# Patient Record
Sex: Female | Born: 1960 | Race: Black or African American | Hispanic: No | Marital: Single | Smoking: Former smoker
Health system: Southern US, Community
[De-identification: ages and names within clinical notes are randomized; demographics above are authoritative.]

## PROBLEM LIST (undated history)

## (undated) DIAGNOSIS — D509 Iron deficiency anemia, unspecified: Secondary | ICD-10-CM

## (undated) DIAGNOSIS — J310 Chronic rhinitis: Secondary | ICD-10-CM

## (undated) DIAGNOSIS — G8929 Other chronic pain: Secondary | ICD-10-CM

## (undated) DIAGNOSIS — I4891 Unspecified atrial fibrillation: Secondary | ICD-10-CM

## (undated) DIAGNOSIS — M722 Plantar fascial fibromatosis: Secondary | ICD-10-CM

## (undated) DIAGNOSIS — G4733 Obstructive sleep apnea (adult) (pediatric): Secondary | ICD-10-CM

## (undated) DIAGNOSIS — I8390 Asymptomatic varicose veins of unspecified lower extremity: Secondary | ICD-10-CM

## (undated) DIAGNOSIS — I10 Essential (primary) hypertension: Secondary | ICD-10-CM

## (undated) DIAGNOSIS — K219 Gastro-esophageal reflux disease without esophagitis: Secondary | ICD-10-CM

## (undated) DIAGNOSIS — B20 Human immunodeficiency virus [HIV] disease: Secondary | ICD-10-CM

## (undated) DIAGNOSIS — G2581 Restless legs syndrome: Secondary | ICD-10-CM

## (undated) HISTORY — DX: Unspecified atrial fibrillation: I48.91

## (undated) HISTORY — DX: Gastro-esophageal reflux disease without esophagitis: K21.9

## (undated) HISTORY — DX: Restless legs syndrome: G25.81

## (undated) HISTORY — DX: Chronic rhinitis: J31.0

## (undated) HISTORY — DX: Other chronic pain: G89.29

## (undated) HISTORY — PX: FOOT SURGERY: SHX648

## (undated) HISTORY — DX: Obstructive sleep apnea (adult) (pediatric): G47.33

## (undated) HISTORY — DX: Iron deficiency anemia, unspecified: D50.9

## (undated) HISTORY — PX: TUBAL LIGATION: SHX77

## (undated) HISTORY — PX: TONSILLECTOMY: SUR1361

## (undated) HISTORY — DX: Human immunodeficiency virus (HIV) disease: B20

---

## 2000-12-10 ENCOUNTER — Emergency Department (HOSPITAL_COMMUNITY): Admission: EM | Admit: 2000-12-10 | Discharge: 2000-12-10 | Payer: Self-pay | Admitting: Emergency Medicine

## 2001-09-27 ENCOUNTER — Emergency Department (HOSPITAL_COMMUNITY): Admission: EM | Admit: 2001-09-27 | Discharge: 2001-09-27 | Payer: Self-pay | Admitting: Emergency Medicine

## 2001-09-27 ENCOUNTER — Encounter: Payer: Self-pay | Admitting: Emergency Medicine

## 2004-10-06 ENCOUNTER — Emergency Department (HOSPITAL_COMMUNITY): Admission: EM | Admit: 2004-10-06 | Discharge: 2004-10-06 | Payer: Self-pay | Admitting: Emergency Medicine

## 2006-08-28 ENCOUNTER — Emergency Department (HOSPITAL_COMMUNITY): Admission: EM | Admit: 2006-08-28 | Discharge: 2006-08-28 | Payer: Self-pay | Admitting: Family Medicine

## 2008-06-03 ENCOUNTER — Emergency Department (HOSPITAL_COMMUNITY): Admission: EM | Admit: 2008-06-03 | Discharge: 2008-06-03 | Payer: Self-pay | Admitting: Emergency Medicine

## 2008-09-04 ENCOUNTER — Emergency Department (HOSPITAL_COMMUNITY): Admission: EM | Admit: 2008-09-04 | Discharge: 2008-09-04 | Payer: Self-pay | Admitting: Family Medicine

## 2010-07-17 ENCOUNTER — Observation Stay (HOSPITAL_COMMUNITY)
Admission: EM | Admit: 2010-07-17 | Discharge: 2010-07-19 | Payer: Self-pay | Source: Home / Self Care | Attending: Internal Medicine | Admitting: Internal Medicine

## 2010-07-17 LAB — BASIC METABOLIC PANEL
BUN: 6 mg/dL (ref 6–23)
CO2: 26 mEq/L (ref 19–32)
Calcium: 8.9 mg/dL (ref 8.4–10.5)
Chloride: 105 mEq/L (ref 96–112)
Creatinine, Ser: 0.71 mg/dL (ref 0.4–1.2)
GFR calc Af Amer: >60 mL/min (ref >60)
GFR calc non Af Amer: >60 mL/min (ref >60)
Glucose, Bld: 92 mg/dL (ref 70–99)
Potassium: 4.1 mEq/L (ref 3.5–5.1)
Sodium: 139 mEq/L (ref 135–145)

## 2010-07-17 LAB — TSH: TSH: 2.598 u[IU]/mL (ref 0.350–4.500)

## 2010-07-17 LAB — PHOSPHORUS: Phosphorus: 3.5 mg/dL (ref 2.3–4.6)

## 2010-07-17 LAB — CK TOTAL AND CKMB (NOT AT ARMC)
CK, MB: 1.3 ng/mL (ref 0.3–4.0)
Relative Index: 1.3 (ref 0.0–2.5)
Total CK: 104 U/L (ref 7–177)

## 2010-07-17 LAB — CARDIAC PANEL(CRET KIN+CKTOT+MB+TROPI)
CK, MB: 1.5 ng/mL (ref 0.3–4.0)
CK, MB: 1.3 ng/mL (ref 0.3–4.0)
Relative Index: 1.3 (ref 0.0–2.5)
Relative Index: 1.2 (ref 0.0–2.5)
Total CK: 113 U/L (ref 7–177)
Total CK: 111 U/L (ref 7–177)
Troponin I: 0.03 ng/mL (ref 0.00–0.06)
Troponin I: 0.01 ng/mL (ref 0.00–0.06)

## 2010-07-17 LAB — DIFFERENTIAL
Basophils Absolute: 0 10*3/uL (ref 0.0–0.1)
Basophils Relative: 0 % (ref 0–1)
Eosinophils Absolute: 0.1 10*3/uL (ref 0.0–0.7)
Eosinophils Relative: 1 % (ref 0–5)
Lymphocytes Relative: 21 % (ref 12–46)
Lymphs Abs: 1.5 10*3/uL (ref 0.7–4.0)
Monocytes Absolute: 0.4 10*3/uL (ref 0.1–1.0)
Monocytes Relative: 6 % (ref 3–12)
Neutro Abs: 5.2 10*3/uL (ref 1.7–7.7)
Neutrophils Relative %: 72 % (ref 43–77)

## 2010-07-17 LAB — CBC
HCT: 22.8 % — ABNORMAL LOW (ref 36.0–46.0)
HCT: 24.8 % — ABNORMAL LOW (ref 36.0–46.0)
Hemoglobin: 6.4 g/dL — CL (ref 12.0–15.0)
Hemoglobin: 7.6 g/dL — ABNORMAL LOW (ref 12.0–15.0)
MCH: 17.4 pg — ABNORMAL LOW (ref 26.0–34.0)
MCH: 19.8 pg — ABNORMAL LOW (ref 26.0–34.0)
MCHC: 28.1 g/dL — ABNORMAL LOW (ref 30.0–36.0)
MCHC: 30.6 g/dL (ref 30.0–36.0)
MCV: 62.1 fL — ABNORMAL LOW (ref 78.0–100.0)
MCV: 64.6 fL — ABNORMAL LOW (ref 78.0–100.0)
Platelets: 218 10*3/uL (ref 150–400)
Platelets: 156 10*3/uL (ref 150–400)
RBC: 3.67 MIL/uL — ABNORMAL LOW (ref 3.87–5.11)
RBC: 3.84 MIL/uL — ABNORMAL LOW (ref 3.87–5.11)
RDW: 21.9 % — ABNORMAL HIGH (ref 11.5–15.5)
RDW: 24.3 % — ABNORMAL HIGH (ref 11.5–15.5)
WBC: 7.2 10*3/uL (ref 4.0–10.5)
WBC: 4.9 10*3/uL (ref 4.0–10.5)

## 2010-07-17 LAB — URINALYSIS, ROUTINE W REFLEX MICROSCOPIC
Bilirubin Urine: NEGATIVE
Hemoglobin, Urine: NEGATIVE
Ketones, ur: 15 mg/dL — AB
Nitrite: NEGATIVE
Protein, ur: NEGATIVE mg/dL
Specific Gravity, Urine: 1.01 (ref 1.005–1.030)
Urine Glucose, Fasting: NEGATIVE mg/dL
Urobilinogen, UA: 0.2 mg/dL (ref 0.0–1.0)
pH: 5.5 (ref 5.0–8.0)

## 2010-07-17 LAB — LIPID PANEL
Cholesterol: 91 mg/dL (ref 0–200)
HDL: 50 mg/dL (ref >39)
LDL Cholesterol: 34 mg/dL (ref 0–99)
Total CHOL/HDL Ratio: 1.8 RATIO
Triglycerides: 35 mg/dL (ref <150)
VLDL: 7 mg/dL (ref 0–40)

## 2010-07-17 LAB — BRAIN NATRIURETIC PEPTIDE: Pro B Natriuretic peptide (BNP): 92 pg/mL (ref 0.0–100.0)

## 2010-07-17 LAB — PROTIME-INR
INR: 1.17 (ref 0.00–1.49)
Prothrombin Time: 15.1 seconds (ref 11.6–15.2)

## 2010-07-17 LAB — FOLATE: Folate: 14.3 ng/mL

## 2010-07-17 LAB — RETICULOCYTES
RBC.: 3.77 MIL/uL — ABNORMAL LOW (ref 3.87–5.11)
Retic Count, Absolute: 30.2 10*3/uL (ref 19.0–186.0)
Retic Ct Pct: 0.8 % (ref 0.4–3.1)

## 2010-07-17 LAB — D-DIMER, QUANTITATIVE: D-Dimer, Quant: 0.34 ug/mL-FEU (ref 0.00–0.48)

## 2010-07-17 LAB — RAPID URINE DRUG SCREEN, HOSP PERFORMED
Amphetamines: NOT DETECTED
Barbiturates: NOT DETECTED
Benzodiazepines: NOT DETECTED
Cocaine: NOT DETECTED
Opiates: POSITIVE — AB
Tetrahydrocannabinol: POSITIVE — AB

## 2010-07-17 LAB — APTT: aPTT: 30 seconds (ref 24–37)

## 2010-07-17 LAB — MAGNESIUM: Magnesium: 2 mg/dL (ref 1.5–2.5)

## 2010-07-17 LAB — PREPARE RBC (CROSSMATCH)

## 2010-07-17 LAB — ABO/RH: ABO/RH(D): O POS

## 2010-07-17 LAB — TROPONIN I: Troponin I: 0.03 ng/mL (ref 0.00–0.06)

## 2010-07-17 LAB — VITAMIN B12: Vitamin B-12: 466 pg/mL (ref 211–911)

## 2010-07-18 ENCOUNTER — Encounter: Payer: Self-pay | Admitting: Gastroenterology

## 2010-07-18 ENCOUNTER — Encounter (INDEPENDENT_AMBULATORY_CARE_PROVIDER_SITE_OTHER): Payer: Self-pay | Admitting: Internal Medicine

## 2010-07-18 LAB — BASIC METABOLIC PANEL
BUN: 6 mg/dL (ref 6–23)
CO2: 24 mEq/L (ref 19–32)
Calcium: 8.5 mg/dL (ref 8.4–10.5)
Chloride: 103 mEq/L (ref 96–112)
Creatinine, Ser: 0.66 mg/dL (ref 0.4–1.2)
GFR calc Af Amer: >60 mL/min (ref >60)
GFR calc non Af Amer: >60 mL/min (ref >60)
Glucose, Bld: 72 mg/dL (ref 70–99)
Potassium: 3.7 mEq/L (ref 3.5–5.1)
Sodium: 137 mEq/L (ref 135–145)

## 2010-07-18 LAB — MRSA PCR SCREENING: MRSA by PCR: NEGATIVE

## 2010-07-18 LAB — HEMOGLOBIN AND HEMATOCRIT, BLOOD
HCT: 26.9 % — ABNORMAL LOW (ref 36.0–46.0)
Hemoglobin: 8.1 g/dL — ABNORMAL LOW (ref 12.0–15.0)

## 2010-07-18 LAB — BRAIN NATRIURETIC PEPTIDE: Pro B Natriuretic peptide (BNP): 74 pg/mL (ref 0.0–100.0)

## 2010-07-18 LAB — MAGNESIUM: Magnesium: 2.2 mg/dL (ref 1.5–2.5)

## 2010-07-18 LAB — TSH: TSH: 6.379 u[IU]/mL — ABNORMAL HIGH (ref 0.350–4.500)

## 2010-07-21 ENCOUNTER — Encounter: Payer: Self-pay | Admitting: Gastroenterology

## 2010-07-28 LAB — CBC
HCT: 28.7 % — ABNORMAL LOW (ref 36.0–46.0)
Hemoglobin: 8.7 g/dL — ABNORMAL LOW (ref 12.0–15.0)
MCH: 20 pg — ABNORMAL LOW (ref 26.0–34.0)
MCHC: 30.3 g/dL (ref 30.0–36.0)
MCV: 66 fL — ABNORMAL LOW (ref 78.0–100.0)
Platelets: 126 10*3/uL — ABNORMAL LOW (ref 150–400)
RBC: 4.35 MIL/uL (ref 3.87–5.11)
RDW: 25.7 % — ABNORMAL HIGH (ref 11.5–15.5)
WBC: 4.7 10*3/uL (ref 4.0–10.5)

## 2010-07-28 LAB — TYPE AND SCREEN
ABO/RH(D): O POS
Antibody Screen: NEGATIVE
Unit division: 0
Unit division: 0
Unit division: 0

## 2010-07-30 ENCOUNTER — Emergency Department (HOSPITAL_COMMUNITY)
Admission: EM | Admit: 2010-07-30 | Discharge: 2010-07-30 | Payer: Self-pay | Source: Home / Self Care | Admitting: Emergency Medicine

## 2010-08-04 LAB — DIFFERENTIAL
Basophils Absolute: 0 10*3/uL (ref 0.0–0.1)
Basophils Relative: 0 % (ref 0–1)
Eosinophils Absolute: 0.2 10*3/uL (ref 0.0–0.7)
Eosinophils Relative: 3 % (ref 0–5)
Lymphocytes Relative: 35 % (ref 12–46)
Lymphs Abs: 1.9 10*3/uL (ref 0.7–4.0)
Monocytes Absolute: 0.4 10*3/uL (ref 0.1–1.0)
Monocytes Relative: 8 % (ref 3–12)
Neutro Abs: 2.8 10*3/uL (ref 1.7–7.7)
Neutrophils Relative %: 54 % (ref 43–77)

## 2010-08-04 LAB — POCT CARDIAC MARKERS
CKMB, poc: <1 ng/mL — ABNORMAL LOW (ref 1.0–8.0)
CKMB, poc: <1 ng/mL — ABNORMAL LOW (ref 1.0–8.0)
Myoglobin, poc: 30.7 ng/mL (ref 12–200)
Myoglobin, poc: 40.5 ng/mL (ref 12–200)
Troponin i, poc: <0.05 ng/mL (ref 0.00–0.09)
Troponin i, poc: <0.05 ng/mL (ref 0.00–0.09)

## 2010-08-04 LAB — POCT I-STAT, CHEM 8
BUN: 10 mg/dL (ref 6–23)
Calcium, Ion: 1.12 mmol/L (ref 1.12–1.32)
Chloride: 105 mEq/L (ref 96–112)
Creatinine, Ser: 0.7 mg/dL (ref 0.4–1.2)
Glucose, Bld: 88 mg/dL (ref 70–99)
HCT: 34 % — ABNORMAL LOW (ref 36.0–46.0)
Hemoglobin: 11.6 g/dL — ABNORMAL LOW (ref 12.0–15.0)
Potassium: 3.7 mEq/L (ref 3.5–5.1)
Sodium: 140 mEq/L (ref 135–145)
TCO2: 25 mmol/L (ref 0–100)

## 2010-08-04 LAB — CBC
HCT: 32 % — ABNORMAL LOW (ref 36.0–46.0)
Hemoglobin: 9.4 g/dL — ABNORMAL LOW (ref 12.0–15.0)
MCH: 20.4 pg — ABNORMAL LOW (ref 26.0–34.0)
MCHC: 29.4 g/dL — ABNORMAL LOW (ref 30.0–36.0)
MCV: 69.4 fL — ABNORMAL LOW (ref 78.0–100.0)
Platelets: 308 10*3/uL (ref 150–400)
RBC: 4.61 MIL/uL (ref 3.87–5.11)
RDW: 30.4 % — ABNORMAL HIGH (ref 11.5–15.5)
WBC: 5.3 10*3/uL (ref 4.0–10.5)

## 2010-08-04 LAB — D-DIMER, QUANTITATIVE: D-Dimer, Quant: 1.06 ug/mL-FEU — ABNORMAL HIGH (ref 0.00–0.48)

## 2010-08-04 LAB — POCT PREGNANCY, URINE: Preg Test, Ur: NEGATIVE

## 2010-08-14 NOTE — Procedures (Signed)
Summary: Upper Endoscopy  Patient: Claudia Brady Note: All result statuses are Final unless otherwise noted.  Tests: (1) Upper Endoscopy (EGD)   EGD Upper Endoscopy       DONE     [facility]     [address]     [place], [place]  [address]           ENDOSCOPY PROCEDURE REPORT     PATIENT:  Claudia Brady, Claudia Brady  MR#:  [id number]     BIRTHDATE:  [birth year], 49 yrs. old  GENDER:  female     ENDOSCOPIST:  Judie Petit T. Russella Dar, MD, [facility]     Referred by:  [facility]     PROCEDURE DATE:  07/18/2010     PROCEDURE:  EGD with biopsy, [address]     ASA CLASS:  Class II     INDICATIONS:  iron deficiency anemia, early satiety     MEDICATIONS:  Fentanyl 50 mcg IV, Versed 4 mg IV     TOPICAL ANESTHETIC:  Cetacaine Spray     DESCRIPTION OF PROCEDURE:   After the risks benefits and     alternatives of the procedure were thoroughly explained, informed     consent was obtained.  The Pentax Gastroscope [id number] endoscope     was introduced through the mouth and advanced to the second     portion of the duodenum, without limitations. A techincal problem     led to black and white photos. The instrument was slowly withdrawn     as the mucosa was fully examined.     <<PROCEDUREIMAGES>>     The esophagus and gastroesophageal junction were completely normal     in appearance. An ulcer was found in the antrum. It was benign     appearing and clean based. It was 5 mm in size. Multiple biopsies     were obtained and sent to pathology. Abnormal appearing mucosa in     the fundus. Thickened erythematous folds vs polyps. Multiple     biopsies were obtained and sent to pathology.  Otherwise normal     stomach.  The duodenal bulb was normal in appearance, as was the     postbulbar duodenum.   Retroflexed views revealed no     abnormalities.  The scope was then withdrawn from the patient and     the procedure completed.           COMPLICATIONS:  None           ENDOSCOPIC IMPRESSION:         1) 5 mm ulcer in the antrum     2) Abnormal mucosa in the fundus           RECOMMENDATIONS:     1) avoid ASA/NSAIDs     2) PPI qam     3) Await pathology results     4) Elective outpatient colonoscopy           Evertt Chouinard T. Russella Dar, MD, [facility]           n.     eSIGNED:   Venita Lick. Aamina Skiff at 07/18/2010 04:24 PM           Galvin Proffer, [id number]  Note: An exclamation mark (!) indicates a result that was not dispersed into the flowsheet. Document Creation Date: 07/18/2010 4:30 PM _______________________________________________________________________  (1) Order result status: Final Collection or observation date-time: 07/18/2010 15:29 Requested date-time:  Receipt date-time:  Reported date-time:  Referring Physician:  Ordering Physician: Claudette Head [phone]) Specimen Source:  Source: Launa Grill Order Number: [id number] Lab site:

## 2010-08-14 NOTE — Letter (Signed)
Summary: Patient Notice-Endo Biopsy Results  [facility]  [address] [place], [place] [address]   Phone: [phone]  Fax: [phone]        July 21, 2010 MRN: [id number]    Claudia Brady [address] [place], [place]  [address]    Dear [name],  I am pleased to inform you that the biopsies taken during your recent endoscopic examination did not show any evidence of cancer upon pathologic examination. The biopsies showed a reactive gastropathy.  Continue with the treatment plan as outlined on the day of your      exam.  Please call us if you are having persistent problems or have questions about your condition that have not been fully answered at this time.  Sincerely,  Meryl Dare MD [facility]  This letter has been electronically signed by your physician.  Appended Document: Patient Notice-Endo Biopsy Results letter mailed to patient's home

## 2010-08-20 ENCOUNTER — Encounter: Payer: Self-pay | Admitting: Pulmonary Disease

## 2010-08-20 ENCOUNTER — Institutional Professional Consult (permissible substitution) (INDEPENDENT_AMBULATORY_CARE_PROVIDER_SITE_OTHER): Payer: Self-pay | Admitting: Pulmonary Disease

## 2010-08-20 DIAGNOSIS — G4733 Obstructive sleep apnea (adult) (pediatric): Secondary | ICD-10-CM | POA: Insufficient documentation

## 2010-08-20 DIAGNOSIS — I4891 Unspecified atrial fibrillation: Secondary | ICD-10-CM | POA: Insufficient documentation

## 2010-08-20 DIAGNOSIS — G2581 Restless legs syndrome: Secondary | ICD-10-CM | POA: Insufficient documentation

## 2010-08-20 DIAGNOSIS — E079 Disorder of thyroid, unspecified: Secondary | ICD-10-CM

## 2010-08-20 DIAGNOSIS — J45909 Unspecified asthma, uncomplicated: Secondary | ICD-10-CM | POA: Insufficient documentation

## 2010-08-20 DIAGNOSIS — J309 Allergic rhinitis, unspecified: Secondary | ICD-10-CM | POA: Insufficient documentation

## 2010-08-20 DIAGNOSIS — K219 Gastro-esophageal reflux disease without esophagitis: Secondary | ICD-10-CM | POA: Insufficient documentation

## 2010-08-20 DIAGNOSIS — D509 Iron deficiency anemia, unspecified: Secondary | ICD-10-CM | POA: Insufficient documentation

## 2010-08-20 HISTORY — DX: Restless legs syndrome: G25.81

## 2010-08-20 HISTORY — DX: Unspecified atrial fibrillation: I48.91

## 2010-08-28 NOTE — Assessment & Plan Note (Signed)
Summary: sleep consult//self referral//SH   Primary Provider/Referring Provider:  Dr. Audria Nine at [facility]  CC:  Sleep evaluation.Marland KitchenMarland KitchenEpworth score is 15.Marland Kitchen  History of Present Illness: 50 yo female for evaluation of sleep apnea.  She was hospitalized recently for GI bleeding and syncope.  During this evaluation concern was raised that she could have sleep apnea.  As a result sleep consultation was requested.  She goes to bed at 9pm, and falls asleep quickly.  She wakes up several times per night, and gets out of bed at 6am.  She feels tired in the morning, and will get headaches in the afternoon.  She does not take naps.  She does not use anything to help sleep, but can drink up to a pot of coffee during the day.  She does snore and wakes up with a choke.  She has been told by family members that she stops breathing while asleep.  She has gained about 20 lbs.  She denies sleep walking, sleep talking, nightmares, or bruxism.  There is no history of sleep hallucinations, sleep paralysis, or cataplexy.  She does not drink alcohol on a regular basis, and recently stopped smoking cigarettes.  She has been concerned about whether she could have thyroid disease.  Her mother has thyroid disease.  TSH from Jan 5 was 2.59, but was 6.37 from Jan 6.  She has noticed funny feelings in her legs before going to sleep, and this sometimes causes her to wake up from sleep.  Epworth score is 15 out of 24.  Echocardiogram  Procedure date:  07/18/2010  Findings:       Study Conclusions    - Left ventricle: The cavity size was normal. Wall thickness was     normal. Systolic function was normal. The estimated ejection     fraction was in the range of 55% to 60%. Wall motion was normal;     there were no regional wall motion abnormalities. There was a     reduced contribution of atrial contraction to ventricular filling,     due to increased ventricular diastolic pressure or atrial     contractile  dysfunction.   - Mitral valve: Mild regurgitation.   - Left atrium: The atrium was mildly dilated.   - Tricuspid valve: Mild regurgitation.   - Inferior vena cava: The vessel was dilated; the respirophasic     diameter changes were blunted (< 50%); findings are consistent     with elevated central venous pressure.   Preventive Screening-Counseling & Management  Alcohol-Tobacco     Alcohol drinks/day: 0     Smoking Status: quit     Packs/Day: 0.5     Year Started: 1982     Year Quit: 07/17/2010     Pack years: 15      Drug Use:  former and Marijuana use---stopped 07/17/2010 per patient.    Current Medications (verified): 1)  Ferrex 150 150 Mg Caps (Polysaccharide Iron Complex) .Marland Kitchen.. 1 By Mouth Daily 2)  Omeprazole 20 Mg Cpdr (Omeprazole) .Marland Kitchen.. 1 By Mouth Daily  Allergies (verified): 1)  ! Pcn  Past History:  Past Medical History: GERD Duodenal ulcer with Upper GI bleeding 2nd to NSAID use, Jan 2012 Iron deficiency anemia Chronic pain Rhinitis  Past Surgical History: Tubal Ligation on 01/25/1985 Left foot surgery in 2002 for heel spur Tonsillectomy  Family History: Sleep apnea Diabetes Hypertension Asthma Allergies Emphysema  Social History: Divorce.  Lives with her boyfriend.  Works with [employer].  Quit  smoking Jul 17, 2010.  Denies significant alcohol use, but has occasional THC use.Alcohol drinks/day:  0 Smoking Status:  quit Packs/Day:  0.5 Pack years:  15 Drug Use:  former, Marijuana use---stopped 07/17/2010 per patient  Review of Systems       The patient complains of shortness of breath with activity, shortness of breath at rest, non-productive cough, chest pain, irregular heartbeats, acid heartburn, indigestion, loss of appetite, weight change, abdominal pain, sore throat, tooth/dental problems, headaches, nasal congestion/difficulty breathing through nose, sneezing, itching, ear ache, anxiety, depression, hand/feet swelling, and joint stiffness  or pain.  The patient denies productive cough, coughing up blood, difficulty swallowing, rash, change in color of mucus, and fever.    Vital Signs:  Patient profile:   50 year old female Height:      66 inches (167.64 cm) Weight:      233 pounds (105.91 kg) BMI:     37.74 O2 Sat:      100 % on Room air Temp:     98.2 degrees F (36.78 degrees C) oral Pulse rate:   62 / minute BP sitting:   120 / 76  (left arm) Cuff size:   large  Vitals Entered By: Michel Bickers CMA (August 20, 2010 2:17 PM)  O2 Sat at Rest %:  100 O2 Flow:  Room air CC: Sleep evaluation.Marland KitchenMarland KitchenEpworth score is 15. Is Patient Diabetic? No Comments Medications reviewed with patient Phone number verified with the patient. Michel Bickers [facility]  August 20, 2010 2:17 PM   Physical Exam  General:  normal appearance, healthy appearing, and obese.   Eyes:  PERRLA and EOMI.   Nose:  no deformity, discharge, inflammation, or lesionsclear nasal discharge.   Mouth:  MP 4, elongated uvula Neck:  no JVD.   Chest Wall:  no deformities noted Lungs:  clear bilaterally to auscultation and percussion Heart:  regular rate and rhythm, S1, S2 without murmurs, rubs, gallops, or clicks Abdomen:  bowel sounds positive; abdomen soft and non-tender without masses, or organomegaly Extremities:  no clubbing, cyanosis, edema, or deformity noted Neurologic:  normal CN II-XII and strength normal.   Cervical Nodes:  no significant adenopathy Psych:  alert and cooperative; normal mood and affect; normal attention span and concentration   Impression & Recommendations:  Problem # 1:  OBSTRUCTIVE SLEEP APNEA (ICD-327.23) She has symptoms suggestive of sleep apnea.  She has daytime sleepiness, and sleep disruption.  To further assess will schedule a sleep test.  I have explained how sleep apnea can affect her health.  Driving precautions, and need for weight loss were reviewed.    Problem # 2:  RESTLESS LEG SYNDROME (ICD-333.94) She has symptoms  suggestive of restless legs.  This can be associated with sleep disruption from OSA, iron deficiency anemia, and thyroid disease.  Will wait until these are improved, and then re-assess whether she needs specific therapy for her legs.  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  Advised her to f/u with primary care for further assessment of this.  Medications Added to Medication List This Visit: 1)  Ferrex 150 150 Mg Caps (Polysaccharide iron complex) .Marland Kitchen.. 1 by mouth daily 2)  Omeprazole 20 Mg Cpdr (Omeprazole) .Marland Kitchen.. 1 by mouth daily  Complete Medication List: 1)  Ferrex 150 150 Mg Caps (Polysaccharide iron complex) .Marland Kitchen.. 1 by mouth daily 2)  Omeprazole 20 Mg Cpdr (Omeprazole) .Marland Kitchen.. 1 by  mouth daily  Other Orders: Consultation Level IV ([address]) Sleep Study (Sleep Study)  Patient Instructions: 1)  Will schedule sleep test 2)  Will call to schedule follow up after sleep test reviewed

## 2010-09-16 ENCOUNTER — Encounter: Payer: Self-pay | Admitting: Pulmonary Disease

## 2010-09-16 ENCOUNTER — Ambulatory Visit (HOSPITAL_BASED_OUTPATIENT_CLINIC_OR_DEPARTMENT_OTHER): Payer: Self-pay | Attending: Pulmonary Disease

## 2010-09-16 DIAGNOSIS — Z6836 Body mass index (BMI) 36.0-36.9, adult: Secondary | ICD-10-CM | POA: Insufficient documentation

## 2010-09-16 DIAGNOSIS — R0609 Other forms of dyspnea: Secondary | ICD-10-CM | POA: Insufficient documentation

## 2010-09-16 DIAGNOSIS — R0989 Other specified symptoms and signs involving the circulatory and respiratory systems: Secondary | ICD-10-CM | POA: Insufficient documentation

## 2010-09-16 DIAGNOSIS — G4733 Obstructive sleep apnea (adult) (pediatric): Secondary | ICD-10-CM | POA: Insufficient documentation

## 2010-09-18 ENCOUNTER — Encounter (INDEPENDENT_AMBULATORY_CARE_PROVIDER_SITE_OTHER): Payer: Self-pay | Admitting: Family Medicine

## 2010-09-18 LAB — CONVERTED CEMR LAB
Alkaline Phosphatase: 75 units/L (ref 39–117)
BUN: 13 mg/dL (ref 6–23)
CO2: 26 meq/L (ref 19–32)
Cholesterol: 145 mg/dL (ref 0–200)
Creatinine, Ser: 0.57 mg/dL (ref 0.40–1.20)
Eosinophils Absolute: 0.2 10*3/uL (ref 0.0–0.7)
Eosinophils Relative: 3 % (ref 0–5)
Glucose, Bld: 93 mg/dL (ref 70–99)
HCT: 33 % — ABNORMAL LOW (ref 36.0–46.0)
HDL: 77 mg/dL (ref >39)
Hemoglobin: 9.7 g/dL — ABNORMAL LOW (ref 12.0–15.0)
LDL Cholesterol: 59 mg/dL (ref 0–99)
Lymphocytes Relative: 34 % (ref 12–46)
Lymphs Abs: 1.7 10*3/uL (ref 0.7–4.0)
MCV: 74.3 fL — ABNORMAL LOW (ref 78.0–100.0)
Monocytes Absolute: 0.4 10*3/uL (ref 0.1–1.0)
Platelets: 294 10*3/uL (ref 150–400)
Sodium: 138 meq/L (ref 135–145)
Total Bilirubin: 0.4 mg/dL (ref 0.3–1.2)
Total CHOL/HDL Ratio: 1.9
Total Protein: 8.1 g/dL (ref 6.0–8.3)
Triglycerides: 43 mg/dL (ref <150)
VLDL: 9 mg/dL (ref 0–40)
WBC: 5 10*3/uL (ref 4.0–10.5)

## 2010-09-25 DIAGNOSIS — G4733 Obstructive sleep apnea (adult) (pediatric): Secondary | ICD-10-CM

## 2010-09-30 NOTE — Miscellaneous (Signed)
Summary: Sleep study  Clinical Lists Changes Split night study.  AHI 16, REM effect.  CPAP 10 cm H2O>>AHI to 2.7, with REM/Supine sleep.  PLMI 54.2.  Results d/w pt over the phone.  Will proceed with CPAP set up, and then have my nurse call to schedule ROV 2 months after set up. Orders: Added new Referral order of DME Referral (DME) - Signed

## 2010-10-28 ENCOUNTER — Other Ambulatory Visit (HOSPITAL_COMMUNITY): Payer: Self-pay | Admitting: Family Medicine

## 2010-10-28 DIAGNOSIS — Z1231 Encounter for screening mammogram for malignant neoplasm of breast: Secondary | ICD-10-CM

## 2010-11-05 ENCOUNTER — Other Ambulatory Visit: Payer: Self-pay | Admitting: Family Medicine

## 2010-11-05 ENCOUNTER — Ambulatory Visit (HOSPITAL_COMMUNITY)
Admission: RE | Admit: 2010-11-05 | Discharge: 2010-11-05 | Disposition: A | Discharge status: Home / Self Care | Payer: Self-pay | Source: Ambulatory Visit | Attending: Family Medicine | Admitting: Family Medicine

## 2010-11-05 DIAGNOSIS — R928 Other abnormal and inconclusive findings on diagnostic imaging of breast: Secondary | ICD-10-CM

## 2010-11-05 DIAGNOSIS — Z1231 Encounter for screening mammogram for malignant neoplasm of breast: Secondary | ICD-10-CM | POA: Insufficient documentation

## 2010-11-07 ENCOUNTER — Ambulatory Visit
Admission: RE | Admit: 2010-11-07 | Discharge: 2010-11-07 | Disposition: A | Discharge status: Home / Self Care | Payer: PRIVATE HEALTH INSURANCE | Source: Ambulatory Visit | Attending: Family Medicine | Admitting: Family Medicine

## 2010-11-07 ENCOUNTER — Telehealth: Payer: Self-pay | Admitting: Pulmonary Disease

## 2010-11-07 DIAGNOSIS — R928 Other abnormal and inconclusive findings on diagnostic imaging of breast: Secondary | ICD-10-CM

## 2010-11-07 DIAGNOSIS — G4733 Obstructive sleep apnea (adult) (pediatric): Secondary | ICD-10-CM

## 2010-11-07 NOTE — Telephone Encounter (Signed)
CPAP download 10/01/10 to 10/30/10>>Used on 16 of 30 nights with average 4hrs 27 min.  Average AHI 10 with CPAP 10 cm H2O.  Will have my nurse arrange for ROV to discuss status of sleep apnea.

## 2010-11-07 NOTE — Telephone Encounter (Signed)
[id number] x1 

## 2010-11-07 NOTE — Assessment & Plan Note (Signed)
CPAP download 10/01/10 to 10/30/10>>Used on 16 of 30 nights with average 4hrs 27 min.  Average AHI 10 with CPAP 10 cm H2O.  Will arrange for ROV to discuss status of sleep apnea.

## 2010-11-10 ENCOUNTER — Encounter: Payer: Self-pay | Admitting: Pulmonary Disease

## 2010-11-10 NOTE — Telephone Encounter (Signed)
Pt is coming in 5/18 at 3:15

## 2010-11-21 ENCOUNTER — Encounter: Payer: Self-pay | Admitting: Pulmonary Disease

## 2010-11-25 ENCOUNTER — Encounter: Payer: Self-pay | Admitting: Pulmonary Disease

## 2010-11-28 ENCOUNTER — Encounter: Payer: Self-pay | Admitting: Pulmonary Disease

## 2010-11-28 ENCOUNTER — Ambulatory Visit (INDEPENDENT_AMBULATORY_CARE_PROVIDER_SITE_OTHER): Payer: Self-pay | Admitting: Pulmonary Disease

## 2010-11-28 DIAGNOSIS — F32A Depression, unspecified: Secondary | ICD-10-CM

## 2010-11-28 DIAGNOSIS — F329 Major depressive disorder, single episode, unspecified: Secondary | ICD-10-CM

## 2010-11-28 DIAGNOSIS — G2581 Restless legs syndrome: Secondary | ICD-10-CM

## 2010-11-28 DIAGNOSIS — G4733 Obstructive sleep apnea (adult) (pediatric): Secondary | ICD-10-CM

## 2010-11-28 HISTORY — DX: Depression, unspecified: F32.A

## 2010-11-28 NOTE — Patient Instructions (Addendum)
Will arrange for evaluation with Behavioral health Try adjusting temperature for CPAP humidifier; call if this does not help Follow up in 6 months

## 2010-11-28 NOTE — Progress Notes (Signed)
Subjective:    Patient ID: Claudia Brady, female    DOB: 06/15/1961, 50 y.o.   MRN: [id number]  HPI 50 yo female with OSA.  She has been using CPAP and feels this helps.  She has a full face mask.  She had a nosebleed once.  Her throat has been getting dry at times.  She has been feeling very sad and depressed.  She has a lot of stress at home, and feels like everything is closing in on her.  Past Medical History  Diagnosis Date  . GERD (gastroesophageal reflux disease)   . Iron deficiency anemia   . Rhinitis   . Chronic pain   . Duodenal ulcer Jan. 2012    Upper GI bleeding 2nd to NSAID use  . OSA (obstructive sleep apnea)      Family History  Problem Relation Age of Onset  . Sleep apnea    . Diabetes    . Hypertension    . Asthma    . Emphysema    . Allergies       History   Social History  . Marital Status: Single    Spouse Name: N/A    Number of Children: N/A  . Years of Education: N/A   Occupational History  . Not on file.   Social History Main Topics  . Smoking status: Current Everyday Smoker -- 15 years    Types: Cigarettes  . Smokeless tobacco: Not on file   Comment: 2 cigs a day  . Alcohol Use: Yes  . Drug Use: Yes     THC   . Sexually Active: Not on file   Other Topics Concern  . Not on file   Social History Narrative  . No narrative on file     Allergies  Allergen Reactions  . Penicillins     REACTION: rash     Outpatient Prescriptions Prior to Visit  Medication Sig Dispense Refill  . iron polysaccharides (NIFEREX) 150 MG capsule Take 150 mg by mouth daily.        Marland Kitchen omeprazole (PRILOSEC) 20 MG capsule Take 20 mg by mouth daily.         Review of Systems    Objective:   Physical Exam  Filed Vitals:   11/28/10 1543  BP: 148/86  Pulse: 72  Temp: 98.1 F (36.7 C)  TempSrc: Oral  Height: 5\' 6"  (1.676 m)  Weight: 254 lb 9.6 oz (115.486 kg)  SpO2: 100%   General: normal appearance, healthy appearing, and obese.  Eyes:  PERRLA and EOMI.  Nose: no deformity, discharge, inflammation, or lesionsclear nasal discharge.  Mouth: MP 4, elongated uvula  Neck: no JVD.  Chest Wall: no deformities noted  Lungs: clear bilaterally to auscultation and percussion  Heart: regular rate and rhythm, S1, S2 without murmurs, rubs, gallops, or clicks  Abdomen: bowel sounds positive; abdomen soft and non-tender without masses, or organomegaly  Extremities: no clubbing, cyanosis, edema, or deformity noted  Neurologic: normal CN II-XII and strength normal.  Cervical Nodes: no significant adenopathy  Psych: anxious, tearful    Assessment & Plan:   OBSTRUCTIVE SLEEP APNEA Will have her try adjusting the temperature on her humidifer.  Encouraged her to use CPAP for the entire time that she is sleeping.    Depression She has been feeling very anxious/depressed/stressed.  She has asked for help dealing with these issues.  Will arrange for evaluation with behavioral health.  Advised her to also discuss with primary care.  RESTLESS LEG SYNDROME Improved.  Will monitor.    Updated Medication List Outpatient Encounter Prescriptions as of 11/28/2010  Medication Sig Dispense Refill  . hydrOXYzine (ATARAX) 25 MG tablet 1 at bedtime       . iron polysaccharides (NIFEREX) 150 MG capsule Take 150 mg by mouth daily.        Marland Kitchen loratadine (CLARITIN) 10 MG tablet Take 10 mg by mouth daily.        Marland Kitchen omeprazole (PRILOSEC) 20 MG capsule Take 20 mg by mouth daily.

## 2010-11-28 NOTE — Assessment & Plan Note (Addendum)
She has been feeling very anxious/depressed/stressed.  She has asked for help dealing with these issues.  Will arrange for evaluation with behavioral health.  Advised her to also discuss with primary care.

## 2010-11-28 NOTE — Assessment & Plan Note (Addendum)
Will have her try adjusting the temperature on her humidifer.  Encouraged her to use CPAP for the entire time that she is sleeping.

## 2010-12-05 ENCOUNTER — Encounter: Payer: Self-pay | Admitting: Pulmonary Disease

## 2010-12-05 NOTE — Assessment & Plan Note (Signed)
Improved  Will monitor

## 2010-12-15 ENCOUNTER — Encounter: Payer: Self-pay | Admitting: Pulmonary Disease

## 2011-01-13 ENCOUNTER — Encounter: Payer: Self-pay | Admitting: Pulmonary Disease

## 2011-12-11 IMAGING — CR DG CHEST 2V
2 series · 2 of 2 positions shown · non-contrast
Comparison: Chest radiograph performed 10/06/2004

CLINICAL DATA: Status post fall; left chest pain.

CHEST - 2 VIEW

[w chest pa]
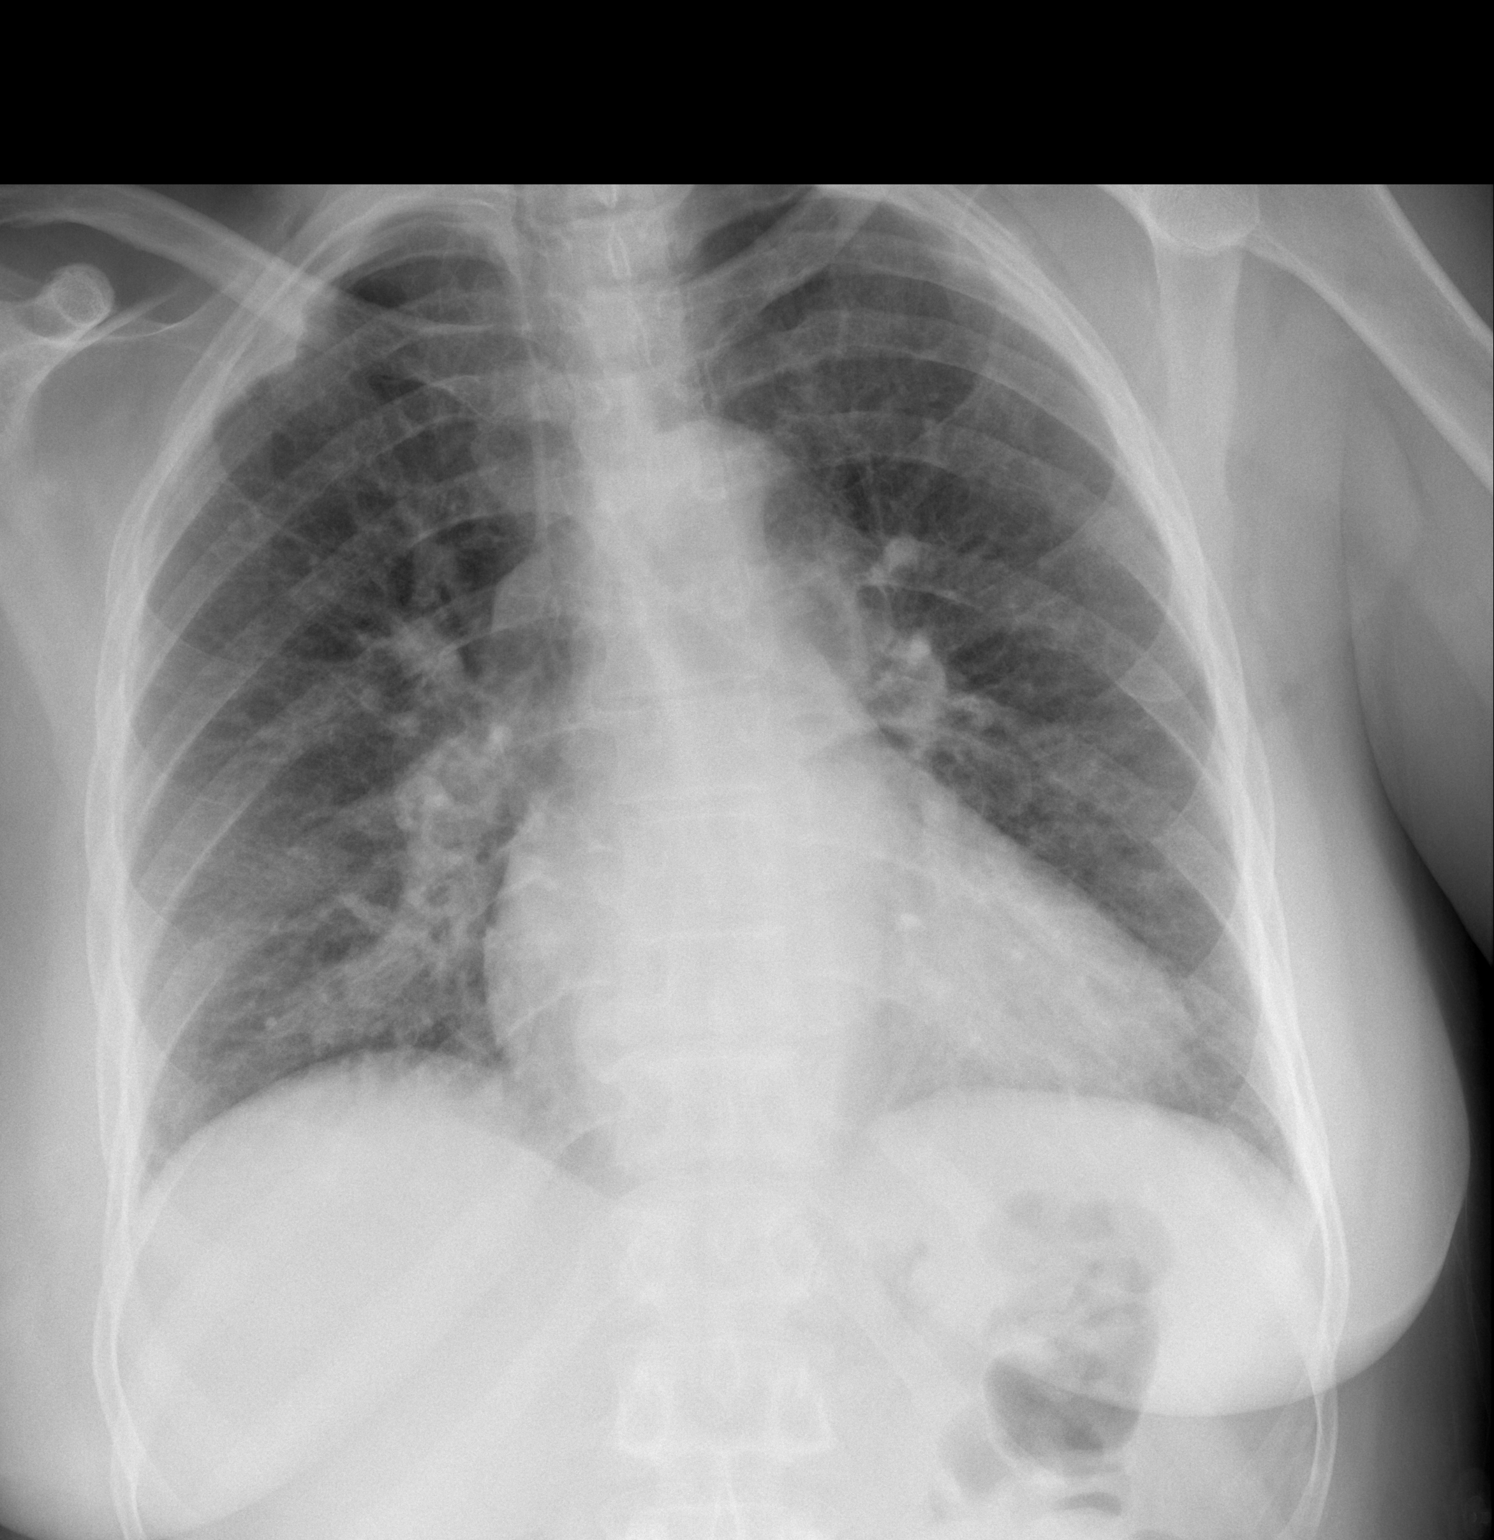

[w chest lat]
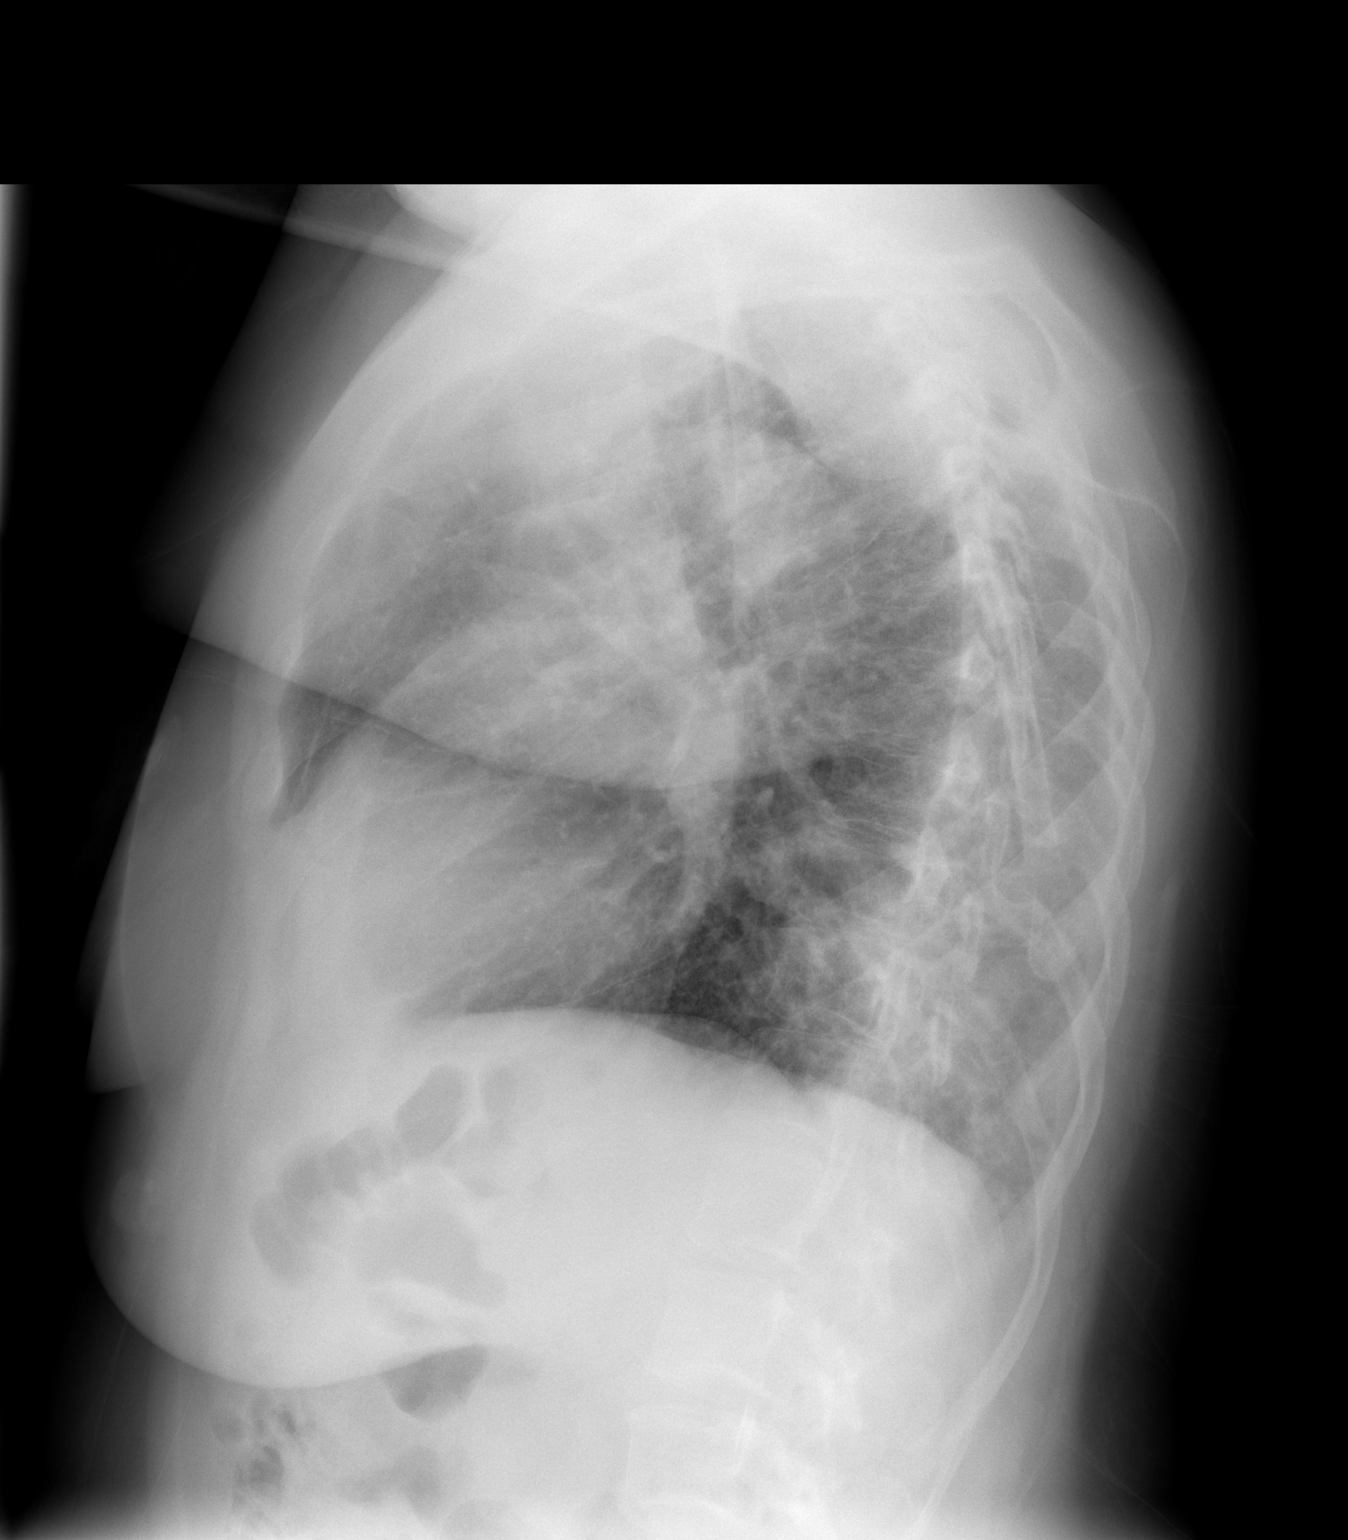

[2 of 2 positions shown; findings below may reference images not displayed]

FINDINGS: The lungs are well-aerated.  Vascular congestion is
noted; mild bilateral atelectasis is seen.  There is no evidence of
pleural effusion or pneumothorax.  Mild biapical pleural thickening
is noted.

The heart is borderline enlarged; the mediastinal contour is within
normal limits.  No acute osseous abnormalities are seen.
IMPRESSION: 1.  Vascular congestion and borderline cardiomegaly; mild bilateral
atelectasis seen.
2.  No displaced rib fractures identified.

## 2012-02-26 ENCOUNTER — Emergency Department (HOSPITAL_COMMUNITY): Payer: Self-pay

## 2012-02-26 ENCOUNTER — Encounter (HOSPITAL_COMMUNITY): Payer: Self-pay

## 2012-02-26 ENCOUNTER — Emergency Department (HOSPITAL_COMMUNITY)
Admission: EM | Admit: 2012-02-26 | Discharge: 2012-02-26 | Disposition: A | Discharge status: Home / Self Care | Payer: Self-pay | Attending: Emergency Medicine | Admitting: Emergency Medicine

## 2012-02-26 DIAGNOSIS — G4733 Obstructive sleep apnea (adult) (pediatric): Secondary | ICD-10-CM | POA: Insufficient documentation

## 2012-02-26 DIAGNOSIS — Z880 Allergy status to penicillin: Secondary | ICD-10-CM | POA: Insufficient documentation

## 2012-02-26 DIAGNOSIS — F17200 Nicotine dependence, unspecified, uncomplicated: Secondary | ICD-10-CM | POA: Insufficient documentation

## 2012-02-26 DIAGNOSIS — M79672 Pain in left foot: Secondary | ICD-10-CM

## 2012-02-26 DIAGNOSIS — G8929 Other chronic pain: Secondary | ICD-10-CM | POA: Insufficient documentation

## 2012-02-26 DIAGNOSIS — K219 Gastro-esophageal reflux disease without esophagitis: Secondary | ICD-10-CM | POA: Insufficient documentation

## 2012-02-26 DIAGNOSIS — M79609 Pain in unspecified limb: Secondary | ICD-10-CM | POA: Insufficient documentation

## 2012-02-26 MED ORDER — TRAMADOL HCL 50 MG PO TABS: 50.0000 mg | ORAL_TABLET | Freq: Four times a day (QID) | ORAL | Status: AC | PRN

## 2012-02-26 NOTE — ED Notes (Signed)
Foot pain on upper part of front ankle, no relief with soaks or rest. sts worse when she is off for a while then tries to bear weight.

## 2012-02-26 NOTE — ED Provider Notes (Signed)
Medical screening examination/treatment/procedure(s) were performed by non-physician practitioner and as supervising physician I was immediately available for consultation/collaboration.   Charles B. Bernette Mayers, MD 02/26/12 1334

## 2012-02-26 NOTE — ED Provider Notes (Signed)
History     CSN: [id number]  Arrival date & time 02/26/12  1121   First MD Initiated Contact with Patient 02/26/12 1135      Chief Complaint  Patient presents with  . Foot Pain    (Consider location/radiation/quality/duration/timing/severity/associated sxs/prior treatment) Patient is a 51 y.o. female presenting with lower extremity pain. The history is provided by the patient. No language interpreter was used.  Foot Pain This is a chronic problem. Episode onset: 3 months. The problem occurs daily. The problem has been gradually worsening. Pertinent negatives include no abdominal pain, chest pain, fever, myalgias, numbness or weakness. The symptoms are aggravated by bending, walking and standing. She has tried rest, heat and relaxation for the symptoms. The treatment provided mild relief.    Past Medical History  Diagnosis Date  . GERD (gastroesophageal reflux disease)   . Iron deficiency anemia   . Rhinitis   . Chronic pain   . Duodenal ulcer Jan. 2012    Upper GI bleeding 2nd to NSAID use  . OSA (obstructive sleep apnea)     Past Surgical History  Procedure Date  . Tubal ligation   . Tonsillectomy   . Foot surgery     Family History  Problem Relation Age of Onset  . Sleep apnea    . Diabetes    . Hypertension    . Asthma    . Emphysema    . Allergies      History  Substance Use Topics  . Smoking status: Current Everyday Smoker -- 15 years    Types: Cigarettes  . Smokeless tobacco: Not on file   Comment: 2 cigs a day  . Alcohol Use: Yes    OB History    Grav Para Term Preterm Abortions TAB SAB Ect Mult Living                  Review of Systems  Constitutional: Negative for fever.  Cardiovascular: Negative for chest pain.  Gastrointestinal: Negative for abdominal pain.  Musculoskeletal: Negative for myalgias.  Neurological: Negative for weakness and numbness.    Allergies  Penicillins  Home Medications   Current Outpatient Rx  Name Route Sig  Dispense Refill  . POLYSACCHARIDE IRON COMPLEX 150 MG PO CAPS Oral Take 150 mg by mouth daily.        BP 131/74  Pulse 64  Temp 98.3 F (36.8 C) (Oral)  Resp 18  SpO2 100%  Physical Exam  Nursing note and vitals reviewed. Constitutional: She appears well-developed and well-nourished. No distress.  HENT:  Head: Atraumatic.  Neck: Neck supple.  Musculoskeletal: She exhibits no edema.       Left knee: Normal.       Left ankle: Normal.       Left foot: She exhibits decreased range of motion, tenderness and bony tenderness. She exhibits no swelling, normal capillary refill, no crepitus, no deformity and no laceration.       Feet:       No calves tenderness, palpable cords, erythema or overlying skin changes.    Neurological: She is alert.  Skin: Skin is warm. No rash noted.  Psychiatric: She has a normal mood and affect.    ED Course  Procedures (including critical care time)  Labs Reviewed - No data to display No results found.  Dg Foot Complete Left  02/26/2012  *RADIOLOGY REPORT*  Clinical Data: Foot pain  LEFT FOOT - COMPLETE 3+ VIEW  Comparison: None.  Findings: No fracture or  dislocation.  Surgical pin is identified in the neck of the first metatarsal.  No worrisome lytic or sclerotic osseous abnormality.  IMPRESSION: No acute bony findings.  Original Report Authenticated By: ERIC A. MANSELL, M.D.    1. L foot pain  MDM  Chronic L foot pain x 3 months, worse with palpation and with walking.  Has prior hx of heel spur surgery in 01, and bunion removal several years ago through [facility].   Does walk regularly  Will xray to r/o stress fx.     12:38 PM Xray neg.  Care instruction and referral given.  Ultram for pain.    BP 131/74  Pulse 64  Temp 98.3 F (36.8 C) (Oral)  Resp 18  SpO2 100%      Fayrene Helper, PA-C 02/26/12 1239

## 2014-01-19 ENCOUNTER — Encounter (HOSPITAL_COMMUNITY): Payer: Self-pay | Admitting: *Deleted

## 2014-01-19 ENCOUNTER — Inpatient Hospital Stay (HOSPITAL_COMMUNITY)
Admission: AD | Admit: 2014-01-19 | Discharge: 2014-01-19 | Disposition: A | Discharge status: Home / Self Care | Payer: Medicaid - Out of State | Source: Ambulatory Visit | Attending: Obstetrics & Gynecology | Admitting: Obstetrics & Gynecology

## 2014-01-19 DIAGNOSIS — Z87891 Personal history of nicotine dependence: Secondary | ICD-10-CM | POA: Diagnosis not present

## 2014-01-19 DIAGNOSIS — S40029A Contusion of unspecified upper arm, initial encounter: Secondary | ICD-10-CM | POA: Diagnosis not present

## 2014-01-19 DIAGNOSIS — G4733 Obstructive sleep apnea (adult) (pediatric): Secondary | ICD-10-CM | POA: Diagnosis not present

## 2014-01-19 DIAGNOSIS — M25579 Pain in unspecified ankle and joints of unspecified foot: Secondary | ICD-10-CM | POA: Insufficient documentation

## 2014-01-19 DIAGNOSIS — S8010XA Contusion of unspecified lower leg, initial encounter: Secondary | ICD-10-CM | POA: Insufficient documentation

## 2014-01-19 DIAGNOSIS — D509 Iron deficiency anemia, unspecified: Secondary | ICD-10-CM | POA: Diagnosis not present

## 2014-01-19 DIAGNOSIS — K219 Gastro-esophageal reflux disease without esophagitis: Secondary | ICD-10-CM | POA: Diagnosis not present

## 2014-01-19 DIAGNOSIS — I8390 Asymptomatic varicose veins of unspecified lower extremity: Secondary | ICD-10-CM | POA: Insufficient documentation

## 2014-01-19 DIAGNOSIS — N390 Urinary tract infection, site not specified: Secondary | ICD-10-CM | POA: Insufficient documentation

## 2014-01-19 DIAGNOSIS — T148XXA Other injury of unspecified body region, initial encounter: Secondary | ICD-10-CM

## 2014-01-19 HISTORY — DX: Plantar fascial fibromatosis: M72.2

## 2014-01-19 HISTORY — DX: Asymptomatic varicose veins of unspecified lower extremity: I83.90

## 2014-01-19 LAB — URINALYSIS, ROUTINE W REFLEX MICROSCOPIC
Bilirubin Urine: NEGATIVE
Glucose, UA: NEGATIVE mg/dL
Hgb urine dipstick: NEGATIVE
Ketones, ur: NEGATIVE mg/dL
Nitrite: POSITIVE — AB
PROTEIN: NEGATIVE mg/dL
Specific Gravity, Urine: 1.01 (ref 1.005–1.030)
UROBILINOGEN UA: 0.2 mg/dL (ref 0.0–1.0)
pH: 5.5 (ref 5.0–8.0)

## 2014-01-19 LAB — URINE MICROSCOPIC-ADD ON

## 2014-01-19 LAB — POCT PREGNANCY, URINE: Preg Test, Ur: NEGATIVE

## 2014-01-19 MED ORDER — NITROFURANTOIN MONOHYD MACRO 100 MG PO CAPS: 100.0000 mg | ORAL_CAPSULE | Freq: Two times a day (BID) | ORAL | Status: DC

## 2014-01-19 NOTE — MAU Provider Note (Addendum)
History     CSN: [id number]  Arrival date and time: 01/19/14 1454   First Provider Initiated Contact with Patient 01/19/14 1543      Chief Complaint  Patient presents with  . Bleeding/Bruising   HPI Claudia Brady is 53 y.o. [id number] presents for evaluation of bruising and feet pain.  Foot pain is chronic and has been evaluated [facility] years [facility].  She lived  Here for 20 year and  moved to [place] last year and stayed 1 year , saw MD there who dx plantar fascitis and "collapsed ankles" and heel spurs.   States she needs another injection in her ankles because the one she had is wearing out and she know she will not be able to walk in a few days.  Saw nutritionist for weigh loss and saw therapist for foot pain.  Bruising "can't tell you when it began a long time ago but now worse--on inner thigh, legs and mainly on arms and legs".  She is not acute.    Past Medical History  Diagnosis Date  . GERD (gastroesophageal reflux disease)   . Iron deficiency anemia   . Rhinitis   . Chronic pain   . Duodenal ulcer Jan. 2012    Upper GI bleeding 2nd to NSAID use  . OSA (obstructive sleep apnea)   . Plantar fascia syndrome   . Varicose vein of leg     Past Surgical History  Procedure Laterality Date  . Tubal ligation    . Tonsillectomy    . Foot surgery      Family History  Problem Relation Age of Onset  . Sleep apnea    . Diabetes    . Hypertension    . Asthma    . Emphysema    . Allergies      History  Substance Use Topics  . Smoking status: Former Smoker -- 15 years    Types: Cigarettes  . Smokeless tobacco: Not on file     Comment: 2 cigs a day  . Alcohol Use: Yes    Allergies:  Allergies  Allergen Reactions  . Other Rash    Patient states she is allergic to sanitary napkins.  . Penicillins Swelling and Rash    Prescriptions prior to admission  Medication Sig Dispense Refill  . iron polysaccharides (NIFEREX) 150 MG capsule Take 150 mg by mouth daily.         . naproxen (NAPROSYN) 500 MG tablet Take 500 mg by mouth 2 (two) times daily with a meal.        Review of Systems  Musculoskeletal:       Bilateral ankle/ foot pain  Endo/Heme/Allergies: Bruises/bleeds easily.   Physical Exam   Blood pressure 142/86, pulse 68, temperature 98.2 F (36.8 C), temperature source Oral, resp. rate 18, height 5\' 4"  (1.626 m), weight 247 lb (112.038 kg), last menstrual period 01/01/2014.  Physical Exam  Constitutional: She is oriented to person, place, and time. She appears well-developed and well-nourished. No distress.  HENT:  Head: Normocephalic.  Neck: Normal range of motion.  Cardiovascular: Normal rate.   Neurological: She is alert and oriented to person, place, and time.  Skin:  Scattered bruising on her arm and legs  Psychiatric: She has a normal mood and affect. Her behavior is normal.   Results for orders placed during the hospital encounter of 01/19/14 (from the past 24 hour(s))  URINALYSIS, ROUTINE W REFLEX MICROSCOPIC     Status: Abnormal  Collection Time    01/19/14  3:20 PM      Result Value Ref Range   Color, Urine YELLOW  YELLOW   APPearance CLEAR  CLEAR   Specific Gravity, Urine 1.010  1.005 - 1.030   pH 5.5  5.0 - 8.0   Glucose, UA NEGATIVE  NEGATIVE mg/dL   Hgb urine dipstick NEGATIVE  NEGATIVE   Bilirubin Urine NEGATIVE  NEGATIVE   Ketones, ur NEGATIVE  NEGATIVE mg/dL   Protein, ur NEGATIVE  NEGATIVE mg/dL   Urobilinogen, UA 0.2  0.0 - 1.0 mg/dL   Nitrite POSITIVE (*) NEGATIVE   Leukocytes, UA TRACE (*) NEGATIVE  URINE MICROSCOPIC-ADD ON     Status: Abnormal   Collection Time    01/19/14  3:20 PM      Result Value Ref Range   Squamous Epithelial / LPF FEW (*) RARE   WBC, UA 3-6  <3 WBC/hpf   RBC / HPF 0-2  <3 RBC/hpf   Bacteria, UA MANY (*) RARE  POCT PREGNANCY, URINE     Status: None   Collection Time    01/19/14  3:28 PM      Result Value Ref Range   Preg Test, Ur NEGATIVE  NEGATIVE    MAU Course  Procedures   Urine culture to lab  MDM  Discussed that our facility does not evaluate her sxs.  Suggested if she feels this is urgent, she can go to Urgent Care at [facility] or one of the ED's.  Also gave her phone number to clinics at [facility] so she can establish care with Internal or [facility].  She was very appreciative for the information and is in NAD at the time of discharge.  She wants treatment for pain in her feet today.  Assessment and Plan  A:  Bruising      Bilateral ankle/foot pain      UTI  P:  Suggested she discontinue ANSAID use until evaluated       Left message at home phone for patient to call back; Rx sent to pharmacy     Culture pending  Salena Ortlieb,EVE M 01/19/2014, 4:05 PM

## 2014-01-19 NOTE — MAU Note (Signed)
Bruises noted on arms and legs. Noted an increase in the last 2 wks.  Feels weak.  Has been taking pain medication for plantar fascitis. (just got here 2 wks ago from [place])

## 2014-01-19 NOTE — MAU Provider Note (Signed)
Attestation of Attending Supervision of Advanced Practitioner (PA/CNM/NP): Evaluation and management procedures were performed by the Advanced Practitioner under my supervision and collaboration.  I have reviewed the Advanced Practitioner's note and chart, and I agree with the management and plan.  Albena Comes, MD, FACOG Attending Obstetrician & Gynecologist Faculty Practice, [facility]   

## 2014-01-19 NOTE — MAU Note (Signed)
Concerned about bruise on her leg- bruise is  443 days old; small bruise on inside of L arm this AM; has not been taking any of her medicines for 2 weeks because she thought the bruises were coming from the meds; had a period last week after having no periods since Nov; has chronic pain in her feet from plantar fascitiis; c/o feeling weak; has had to got to part-time work due to feet pain;

## 2014-01-19 NOTE — MAU Provider Note (Signed)
Attestation of Attending Supervision of Advanced Practitioner (PA/CNM/NP): Evaluation and management procedures were performed by the Advanced Practitioner under my supervision and collaboration.  I have reviewed the Advanced Practitioner's note and chart, and I agree with the management and plan.  Milee Qualls, MD, FACOG Attending Obstetrician & Gynecologist Faculty Practice, [facility]   

## 2014-01-19 NOTE — Discharge Instructions (Signed)
Ankle Pain Ankle pain is a common symptom. The bones, cartilage, tendons, and muscles of the ankle joint perform a lot of work each day. The ankle joint holds your body weight and allows you to move around. Ankle pain can occur on either side or back of 1 or both ankles. Ankle pain may be sharp and burning or dull and aching. There may be tenderness, stiffness, redness, or warmth around the ankle. The pain occurs more often when a person walks or puts pressure on the ankle. CAUSES  There are many reasons ankle pain can develop. It is important to work with your caregiver to identify the cause since many conditions can impact the bones, cartilage, muscles, and tendons. Causes for ankle pain include:  Injury, including a break (fracture), sprain, or strain often due to a fall, sports, or a high-impact activity.  Swelling (inflammation) of a tendon (tendonitis).  Achilles tendon rupture.  Ankle instability after repeated sprains and strains.  Poor foot alignment.  Pressure on a nerve (tarsal tunnel syndrome).  Arthritis in the ankle or the lining of the ankle.  Crystal formation in the ankle (gout or pseudogout). DIAGNOSIS  A diagnosis is based on your medical history, your symptoms, results of your physical exam, and results of diagnostic tests. Diagnostic tests may include X-ray exams or a computerized magnetic scan (magnetic resonance imaging, MRI). TREATMENT  Treatment will depend on the cause of your ankle pain and may include:  Keeping pressure off the ankle and limiting activities.  Using crutches or other walking support (a cane or brace).  Using rest, ice, compression, and elevation.  Participating in physical therapy or home exercises.  Wearing shoe inserts or special shoes.  Losing weight.  Taking medications to reduce pain or swelling or receiving an injection.  Undergoing surgery. HOME CARE INSTRUCTIONS   Only take over-the-counter or prescription medicines for  pain, discomfort, or fever as directed by your caregiver.  Put ice on the injured area.  Put ice in a plastic bag.  Place a towel between your skin and the bag.  Leave the ice on for 15-20 minutes at a time, 03-04 times a day.  Keep your leg raised (elevated) when possible to lessen swelling.  Avoid activities that cause ankle pain.  Follow specific exercises as directed by your caregiver.  Record how often you have ankle pain, the location of the pain, and what it feels like. This information may be helpful to you and your caregiver.  Ask your caregiver about returning to work or sports and whether you should drive.  Follow up with your caregiver for further examination, therapy, or testing as directed. SEEK MEDICAL CARE IF:   Pain or swelling continues or worsens beyond 1 week.  You have an oral temperature above 102 F (38.9 C).  You are feeling unwell or have chills.  You are having an increasingly difficult time with walking.  You have loss of sensation or other new symptoms.  You have questions or concerns. MAKE SURE YOU:   Understand these instructions.  Will watch your condition.  Will get help right away if you are not doing well or get worse. Document Released: 12/17/2009 Document Revised: 09/21/2011 Document Reviewed: 12/17/2009 [facility] Patient Information 2015 [place], [place]. This information is not intended to replace advice given to you by your health care provider. Make sure you discuss any questions you have with your health care provider.  STOP IBUPROFEN  And NAPROXSYN until reevaluated for bruising

## 2014-01-22 LAB — URINE CULTURE
Colony Count: >=100000
SPECIAL REQUESTS: NORMAL

## 2014-01-23 ENCOUNTER — Other Ambulatory Visit: Payer: Self-pay | Admitting: Medical

## 2014-01-23 DIAGNOSIS — N3000 Acute cystitis without hematuria: Secondary | ICD-10-CM

## 2014-01-23 MED ORDER — CIPROFLOXACIN HCL 500 MG PO TABS: 500.0000 mg | ORAL_TABLET | Freq: Two times a day (BID) | ORAL | Status: DC

## 2014-01-23 NOTE — Progress Notes (Signed)
Urine culture results show intermediate sensitivity to Macrobid. Rx for Cipro sent to patient's pharmacy.   Freddi StarrJulie N Ethier, PA-C 01/23/2014 8:15 PM

## 2014-03-17 ENCOUNTER — Encounter (HOSPITAL_COMMUNITY): Payer: Self-pay | Admitting: Emergency Medicine

## 2014-03-17 ENCOUNTER — Emergency Department (INDEPENDENT_AMBULATORY_CARE_PROVIDER_SITE_OTHER)
Admission: EM | Admit: 2014-03-17 | Discharge: 2014-03-17 | Disposition: A | Discharge status: Home / Self Care | Payer: PRIVATE HEALTH INSURANCE | Source: Home / Self Care | Attending: Family Medicine | Admitting: Family Medicine

## 2014-03-17 DIAGNOSIS — K047 Periapical abscess without sinus: Secondary | ICD-10-CM

## 2014-03-17 LAB — CBC WITH DIFFERENTIAL/PLATELET
BASOS ABS: 0 10*3/uL (ref 0.0–0.1)
Basophils Relative: 1 % (ref 0–1)
Eosinophils Absolute: 0 10*3/uL (ref 0.0–0.7)
Eosinophils Relative: 1 % (ref 0–5)
HCT: 32.4 % — ABNORMAL LOW (ref 36.0–46.0)
Hemoglobin: 10.5 g/dL — ABNORMAL LOW (ref 12.0–15.0)
Lymphocytes Relative: 27 % (ref 12–46)
Lymphs Abs: 0.8 10*3/uL (ref 0.7–4.0)
MCH: 24.6 pg — ABNORMAL LOW (ref 26.0–34.0)
MCHC: 32.4 g/dL (ref 30.0–36.0)
MCV: 76.1 fL — ABNORMAL LOW (ref 78.0–100.0)
Monocytes Absolute: 0.5 10*3/uL (ref 0.1–1.0)
Monocytes Relative: 15 % — ABNORMAL HIGH (ref 3–12)
NEUTROS ABS: 1.7 10*3/uL (ref 1.7–7.7)
NEUTROS PCT: 57 % (ref 43–77)
Platelets: 209 10*3/uL (ref 150–400)
RBC: 4.26 MIL/uL (ref 3.87–5.11)
RDW: 17.3 % — AB (ref 11.5–15.5)
WBC: 3 10*3/uL — AB (ref 4.0–10.5)

## 2014-03-17 MED ORDER — ACETAMINOPHEN 325 MG PO TABS
ORAL_TABLET | ORAL | Status: AC
  Filled 2014-03-17: qty 2

## 2014-03-17 MED ORDER — CLINDAMYCIN HCL 300 MG PO CAPS: 300.0000 mg | ORAL_CAPSULE | Freq: Three times a day (TID) | ORAL | Status: DC

## 2014-03-17 MED ORDER — ACETAMINOPHEN 325 MG PO TABS
650.0000 mg | ORAL_TABLET | Freq: Once | ORAL | Status: AC
  Administered 2014-03-17: 650 mg via ORAL

## 2014-03-17 NOTE — Discharge Instructions (Signed)
Take medicine as prescribed, see your dentist as soon as possible, drink plenty of fluids, tylenol for fever.

## 2014-03-17 NOTE — ED Notes (Addendum)
Pt    Reports     Pain  And  Tenderness   To  r  Side  Of  Face       With  Fever       As  Well        Pt  Reports  Has had  A    Toothache    For  The  Last   3  Weeks      Has  Not  Seen a  Dentist  Recently      Pt  Also  Has  Dental  Caries  As  Well

## 2014-03-17 NOTE — ED Provider Notes (Signed)
CSN: [id number]     Arrival date & time 03/17/14  1727 History   First MD Initiated Contact with Patient 03/17/14 1736     Chief Complaint  Patient presents with  . Dental Problem   (Consider location/radiation/quality/duration/timing/severity/associated sxs/prior Treatment) Patient is a 53 y.o. female presenting with tooth pain. The history is provided by the patient.  Dental Pain Location:  Generalized Quality:  Throbbing Severity:  Moderate Onset quality:  Gradual Duration:  3 weeks Progression:  Worsening Chronicity:  New Context: dental caries and poor dentition   Worsened by:  Touching Associated symptoms: facial pain and fever   Associated symptoms: no facial swelling   Risk factors: periodontal disease     Past Medical History  Diagnosis Date  . GERD (gastroesophageal reflux disease)   . Iron deficiency anemia   . Rhinitis   . Chronic pain   . Duodenal ulcer Jan. 2012    Upper GI bleeding 2nd to NSAID use  . OSA (obstructive sleep apnea)   . Plantar fascia syndrome   . Varicose vein of leg    Past Surgical History  Procedure Laterality Date  . Tubal ligation    . Tonsillectomy    . Foot surgery     Family History  Problem Relation Age of Onset  . Sleep apnea    . Diabetes    . Hypertension    . Asthma    . Emphysema    . Allergies     History  Substance Use Topics  . Smoking status: Former Smoker -- 15 years    Types: Cigarettes  . Smokeless tobacco: Not on file     Comment: 2 cigs a day  . Alcohol Use: Yes   OB History   Grav Para Term Preterm Abortions TAB SAB Ect Mult Living   Review of Systems  Constitutional: Positive for fever.  HENT: Positive for dental problem. Negative for facial swelling and sore throat.   Neurological: Positive for dizziness and weakness.    Allergies  Other and Penicillins  Home Medications   Prior to Admission medications   Medication Sig Start Date End Date Taking? Authorizing  Provider  capsaicin (ZOSTRIX) 0.025 % cream Apply 1 application topically 2 (two) times daily.    Historical Provider, MD  ciprofloxacin (CIPRO) 500 MG tablet Take 1 tablet (500 mg total) by mouth 2 (two) times daily. 01/23/14   Marny Lowenstein, PA-C  clindamycin (CLEOCIN) 300 MG capsule Take 1 capsule (300 mg total) by mouth 3 (three) times daily. 03/17/14   Linna Hoff, MD  docusate sodium (COLACE) 100 MG capsule Take 100 mg by mouth 2 (two) times daily.    Historical Provider, MD  ferrous sulfate 325 (65 FE) MG tablet Take 325 mg by mouth 2 (two) times daily with a meal.    Historical Provider, MD  Multiple Vitamin (MULTIVITAMIN WITH MINERALS) TABS tablet Take 1 tablet by mouth daily.    Historical Provider, MD  nitrofurantoin, macrocrystal-monohydrate, (MACROBID) 100 MG capsule Take 1 capsule (100 mg total) by mouth 2 (two) times daily. 01/19/14   Elta Guadeloupe, NP  omeprazole (PRILOSEC) 40 MG capsule Take 40 mg by mouth daily.    Historical Provider, MD   BP 156/103  Pulse 93  Temp(Src) 101.4 F (38.6 C) (Oral)  Resp 16  SpO2 99% Physical Exam  Nursing note and vitals reviewed. Constitutional: She is oriented to  person, place, and time. She appears well-developed and well-nourished. She appears distressed.  HENT:  Right Ear: External ear normal.  Left Ear: External ear normal.  Mouth/Throat: Uvula is midline, oropharynx is clear and moist and mucous membranes are normal. Abnormal dentition. Dental abscesses and dental caries present.  Neck: Normal range of motion. Neck supple.  Lymphadenopathy:    She has cervical adenopathy.  Neurological: She is alert and oriented to person, place, and time.  Skin: Skin is warm and dry.    ED Course  Procedures (including critical care time) Labs Review Labs Reviewed  CBC WITH DIFFERENTIAL - Abnormal; Notable for the following:    WBC 3.0 (*)    Hemoglobin 10.5 (*)    HCT 32.4 (*)    MCV 76.1 (*)    MCH 24.6 (*)    RDW 17.3 (*)     Monocytes Relative 15 (*)    All other components within normal limits   Cbc  Wbc 3.0,  i-stat wnl. Imaging Review No results found.   MDM   1. Dental abscess        Linna Hoff, MD 03/17/14 [phone]

## 2014-03-20 LAB — POCT I-STAT, CHEM 8
BUN: 11 mg/dL (ref 6–23)
Calcium, Ion: 1.12 mmol/L (ref 1.12–1.23)
Chloride: 102 mEq/L (ref 96–112)
Creatinine, Ser: 0.8 mg/dL (ref 0.50–1.10)
Glucose, Bld: 97 mg/dL (ref 70–99)
HCT: 37 % (ref 36.0–46.0)
Hemoglobin: 12.6 g/dL (ref 12.0–15.0)
POTASSIUM: 3.7 meq/L (ref 3.7–5.3)
SODIUM: 137 meq/L (ref 137–147)
TCO2: 25 mmol/L (ref 0–100)

## 2014-05-14 ENCOUNTER — Encounter (HOSPITAL_COMMUNITY): Payer: Self-pay | Admitting: Emergency Medicine

## 2014-11-26 ENCOUNTER — Emergency Department (INDEPENDENT_AMBULATORY_CARE_PROVIDER_SITE_OTHER): Payer: No Typology Code available for payment source

## 2014-11-26 ENCOUNTER — Emergency Department (INDEPENDENT_AMBULATORY_CARE_PROVIDER_SITE_OTHER)
Admission: EM | Admit: 2014-11-26 | Discharge: 2014-11-26 | Disposition: A | Discharge status: Home / Self Care | Payer: No Typology Code available for payment source | Source: Home / Self Care | Attending: Family Medicine | Admitting: Family Medicine

## 2014-11-26 ENCOUNTER — Encounter (HOSPITAL_COMMUNITY): Payer: Self-pay | Admitting: Emergency Medicine

## 2014-11-26 DIAGNOSIS — R2241 Localized swelling, mass and lump, right lower limb: Secondary | ICD-10-CM

## 2014-11-26 MED ORDER — TRAMADOL HCL 50 MG PO TABS: 50.0000 mg | ORAL_TABLET | Freq: Four times a day (QID) | ORAL | Status: DC | PRN

## 2014-11-26 NOTE — ED Notes (Signed)
Pt states that she has been having leg and feet pain pt states that its been since 2012

## 2014-11-26 NOTE — ED Provider Notes (Signed)
Claudia ProfferMarcella Brady is a 54 y.o. female who presents to Urgent Care today for right leg pain. The patient notes months of right anterior lower leg pain. This is been present for months that has occurred without injury. No radiating pain weakness or numbness. The pain is worse with activity and better with rest. She additionally has chronic bilateral plantar fasciitis which causes significant pain. No fevers or chills.   Past Medical History  Diagnosis Date  . GERD (gastroesophageal reflux disease)   . Iron deficiency anemia   . Rhinitis   . Chronic pain   . Duodenal ulcer Jan. 2012    Upper GI bleeding 2nd to NSAID use  . OSA (obstructive sleep apnea)   . Plantar fascia syndrome   . Varicose vein of leg    Past Surgical History  Procedure Laterality Date  . Tubal ligation    . Tonsillectomy    . Foot surgery     History  Substance Use Topics  . Smoking status: Former Smoker -- 15 years    Types: Cigarettes  . Smokeless tobacco: Not on file     Comment: 2 cigs a day  . Alcohol Use: Yes   ROS as above Medications: No current facility-administered medications for this encounter.   Current Outpatient Prescriptions  Medication Sig Dispense Refill  . capsaicin (ZOSTRIX) 0.025 % cream Apply 1 application topically 2 (two) times daily.    . ciprofloxacin (CIPRO) 500 MG tablet Take 1 tablet (500 mg total) by mouth 2 (two) times daily. 6 tablet 0  . clindamycin (CLEOCIN) 300 MG capsule Take 1 capsule (300 mg total) by mouth 3 (three) times daily. 21 capsule 0  . docusate sodium (COLACE) 100 MG capsule Take 100 mg by mouth 2 (two) times daily.    . ferrous sulfate 325 (65 FE) MG tablet Take 325 mg by mouth 2 (two) times daily with a meal.    . Multiple Vitamin (MULTIVITAMIN WITH MINERALS) TABS tablet Take 1 tablet by mouth daily.    . nitrofurantoin, macrocrystal-monohydrate, (MACROBID) 100 MG capsule Take 1 capsule (100 mg total) by mouth 2 (two) times daily. 14 capsule 0  . omeprazole  (PRILOSEC) 40 MG capsule Take 40 mg by mouth daily.    . traMADol (ULTRAM) 50 MG tablet Take 1 tablet (50 mg total) by mouth every 6 (six) hours as needed. 15 tablet 0   Allergies  Allergen Reactions  . Other Rash    Patient states she is allergic to sanitary napkins.  . Penicillins Swelling and Rash     Exam:  BP 158/92 mmHg  Pulse 66  Temp(Src) 97.6 F (36.4 C) (Oral)  Resp 16  SpO2 97%  LMP 05/17/2014 Gen: Well NAD HEENT: EOMI,  MMM Lungs: Normal work of breathing. CTABL Heart: RRR no MRG Abd: NABS, Soft. Nondistended, Nontender Exts: Brisk capillary refill, warm and well perfused.  Right leg: anterior lateral calf with tender area about the mid anterior to lateral calf near the anterior tibialis muscle belly. Knee is obese but without effusion and nontender normal motion Pulses intact distally  Limited musculoskeletal ultrasound:  Area of maximal tenderness visualized with ultrasound. She has area of hypoechoic signal with increased Doppler activity just superficial to the myofascial plane.  No results found for this or any previous visit (from the past 24 hour(s)). Dg Tibia/fibula Right  11/26/2014   CLINICAL DATA:  Ankle pain.  Mass and pain in the lateral calf.  EXAM: RIGHT TIBIA AND FIBULA - 2  VIEW  COMPARISON:  None.  FINDINGS: There is no evidence of fracture or other focal bone lesions. Soft tissues are unremarkable.  IMPRESSION: Negative.   Electronically Signed   By: Andreas NewportGeoffrey  Lamke M.D.   On: 11/26/2014 18:06   Dg Knee Complete 4 Views Right  11/26/2014   CLINICAL DATA:  Right knee pain since last year.  EXAM: RIGHT KNEE - COMPLETE 4+ VIEW  COMPARISON:  None.  FINDINGS: There is no evidence of fracture, dislocation, or joint effusion. There is mild medial femorotibial compartment joint space narrowing. Soft tissues are unremarkable.  IMPRESSION: No acute osseous injury of the right knee.   Electronically Signed   By: Elige KoHetal  Patel   On: 11/26/2014 18:05     Assessment and Plan: 54 y.o. female with right tender leg mass. Possibly varicose veins versus mass. Recommend compression and follow up with sports medicine. She may benefit from MRI to evaluate the mass.  Discussed warning signs or symptoms. Please see discharge instructions. Patient expresses understanding.     Rodolph BongEvan S Jiaire Rosebrook, MD 11/26/14 [phone]

## 2014-11-26 NOTE — Discharge Instructions (Signed)
Thank you for coming in today. Use compression stocking.  [facility].  Return as needed.   Varicose Veins Varicose veins are veins that have become enlarged and twisted. CAUSES This condition is the result of valves in the veins not working properly. Valves in the veins help return blood from the leg to the heart. If these valves are damaged, blood flows backwards and backs up into the veins in the leg near the skin. This causes the veins to become larger. People who are on their feet a lot, who are pregnant, or who are overweight are more likely to develop varicose veins. SYMPTOMS   Bulging, twisted-appearing, bluish veins, most commonly found on the legs.  Leg pain or a feeling of heaviness. These symptoms may be worse at the end of the day.  Leg swelling.  Skin color changes. DIAGNOSIS  Varicose veins can usually be diagnosed with an exam of your legs by your caregiver. He or she may recommend an ultrasound of your leg veins. TREATMENT  Most varicose veins can be treated at home.However, other treatments are available for people who have persistent symptoms or who want to treat the cosmetic appearance of the varicose veins. These include:  Laser treatment of very small varicose veins.  Medicine that is shot (injected) into the vein. This medicine hardens the walls of the vein and closes off the vein. This treatment is called sclerotherapy. Afterwards, you may need to wear clothing or bandages that apply pressure.  Surgery. HOME CARE INSTRUCTIONS   Do not stand or sit in one position for long periods of time. Do not sit with your legs crossed. Rest with your legs raised during the day.  Wear elastic stockings or support hose. Do not wear other tight, encircling garments around the legs, pelvis, or waist.  Walk as much as possible to increase blood flow.  Raise the foot of your bed at night with 2-inch blocks.  If you get a cut in the skin over the  vein and the vein bleeds, lie down with your leg raised and press on it with a clean cloth until the bleeding stops. Then place a bandage (dressing) on the cut. See your caregiver if it continues to bleed or needs stitches. SEEK MEDICAL CARE IF:   The skin around your ankle starts to break down.  You have pain, redness, tenderness, or hard swelling developing in your leg over a vein.  You are uncomfortable due to leg pain. Document Released: 04/08/2005 Document Revised: 09/21/2011 Document Reviewed: 08/25/2010 [facility] Patient Information 2015 [place], [place]. This information is not intended to replace advice given to you by your health care provider. Make sure you discuss any questions you have with your health care provider.

## 2014-12-03 ENCOUNTER — Encounter: Payer: Self-pay | Admitting: Sports Medicine

## 2014-12-03 ENCOUNTER — Ambulatory Visit (INDEPENDENT_AMBULATORY_CARE_PROVIDER_SITE_OTHER): Payer: No Typology Code available for payment source | Admitting: Sports Medicine

## 2014-12-03 VITALS — BP 150/93 | HR 70 | Ht 65.0 in | Wt 290.0 lb

## 2014-12-03 DIAGNOSIS — M722 Plantar fascial fibromatosis: Secondary | ICD-10-CM

## 2014-12-03 DIAGNOSIS — M79661 Pain in right lower leg: Secondary | ICD-10-CM

## 2014-12-03 NOTE — Progress Notes (Signed)
   Subjective:    Patient ID: Claudia Brady, female    DOB: September 05, 1960, 54 y.o.   MRN: [id number]  HPIchief complaint: Bilateral foot pain and right lower leg pain  54 year old female comes in today complaining of chronic bilateral foot pain. She has a history of plantar fasciitis. She was living in [place] up until a year ago. While in [place] she was getting treatment including physical therapy which did seem to help. Since returning to [place] year ago she has not had any real treatment. In August of last year she began to develop some lateral right lower leg pain which is worse with standing and walking. She's noticed some mild swelling. She was seen at urgent care and x-rays were unremarkable. Concern was raised about possible stress fracture and she was referred to our office. She has custom orthotics for her shoes but her current shoes do not have those inserts in them. She tells me that she has gained approximately 100 pounds over the past year or so and thinks that that may be contributing to her pain. She denies numbness or tingling.  Past medical history reviewed\ Medications reviewed Allergies reviewed    Review of Systems As above    Objective:   Physical Exam Obese. No acute distress  Bilateral pes planus. Tenderness to palpation at the calcaneal insertion of both plantar fascia. Negative calcaneal squeeze.  Right lower leg: Tenderness to palpation along the proximal lateral lower leg. Mild soft tissue swelling. No erythema. No calf swelling. Negative Homans. Neurovascularly intact distally.       Assessment & Plan:  Pes planus with chronic plantar fasciitis  Right lower leg pain of unknown etiology-rule out soft tissue pathology versus proximal fibular stress fracture  Obesity  Given the chronicity of her right lower leg pain and normal radiographs we will proceed with an MRI to rule out soft tissue pathology and a proximal fibular stress fracture. I will  fit her with green sports insoles and scaphoid pads for her current shoes and I will call her with the MRI results once available. We discussed the role that obesity plays in her ongoing chronic leg and foot pain. She understands that without significant weight loss her pain will likely continue.

## 2014-12-09 ENCOUNTER — Inpatient Hospital Stay: Admission: RE | Admit: 2014-12-09 | Payer: No Typology Code available for payment source | Source: Ambulatory Visit

## 2016-07-13 DIAGNOSIS — Z21 Asymptomatic human immunodeficiency virus [HIV] infection status: Secondary | ICD-10-CM

## 2016-07-13 DIAGNOSIS — B20 Human immunodeficiency virus [HIV] disease: Secondary | ICD-10-CM

## 2016-07-13 HISTORY — DX: Human immunodeficiency virus (HIV) disease: B20

## 2016-07-13 HISTORY — DX: Asymptomatic human immunodeficiency virus (hiv) infection status: Z21

## 2016-09-24 ENCOUNTER — Telehealth: Payer: Self-pay

## 2016-09-24 NOTE — Telephone Encounter (Signed)
Patient requested a reminder letter for appointment.    Letter mailed today.   Laurell Josephsammy K Runa Whittingham, RN

## 2016-10-02 ENCOUNTER — Other Ambulatory Visit: Payer: Self-pay

## 2016-10-02 ENCOUNTER — Other Ambulatory Visit: Payer: Self-pay | Admitting: Infectious Diseases

## 2016-10-02 ENCOUNTER — Ambulatory Visit (HOSPITAL_COMMUNITY)
Admission: RE | Admit: 2016-10-02 | Discharge: 2016-10-02 | Disposition: A | Discharge status: Home / Self Care | Payer: Self-pay | Source: Ambulatory Visit | Attending: Infectious Diseases | Admitting: Infectious Diseases

## 2016-10-02 ENCOUNTER — Ambulatory Visit (INDEPENDENT_AMBULATORY_CARE_PROVIDER_SITE_OTHER): Payer: Self-pay | Admitting: Infectious Diseases

## 2016-10-02 ENCOUNTER — Ambulatory Visit: Payer: Self-pay

## 2016-10-02 ENCOUNTER — Encounter: Payer: Self-pay | Admitting: Infectious Diseases

## 2016-10-02 VITALS — BP 169/101 | HR 57 | Temp 98.6°F | Ht 64.25 in | Wt 240.0 lb

## 2016-10-02 DIAGNOSIS — Z79899 Other long term (current) drug therapy: Secondary | ICD-10-CM

## 2016-10-02 DIAGNOSIS — Z113 Encounter for screening for infections with a predominantly sexual mode of transmission: Secondary | ICD-10-CM

## 2016-10-02 DIAGNOSIS — K089 Disorder of teeth and supporting structures, unspecified: Secondary | ICD-10-CM | POA: Insufficient documentation

## 2016-10-02 DIAGNOSIS — Z0000 Encounter for general adult medical examination without abnormal findings: Secondary | ICD-10-CM

## 2016-10-02 DIAGNOSIS — I4891 Unspecified atrial fibrillation: Secondary | ICD-10-CM

## 2016-10-02 DIAGNOSIS — E079 Disorder of thyroid, unspecified: Secondary | ICD-10-CM

## 2016-10-02 DIAGNOSIS — R001 Bradycardia, unspecified: Secondary | ICD-10-CM | POA: Insufficient documentation

## 2016-10-02 DIAGNOSIS — B20 Human immunodeficiency virus [HIV] disease: Secondary | ICD-10-CM

## 2016-10-02 DIAGNOSIS — Z21 Asymptomatic human immunodeficiency virus [HIV] infection status: Secondary | ICD-10-CM | POA: Insufficient documentation

## 2016-10-02 LAB — COMPREHENSIVE METABOLIC PANEL
ALT: 10 U/L (ref 6–29)
AST: 24 U/L (ref 10–35)
Albumin: 3.6 g/dL (ref 3.6–5.1)
Alkaline Phosphatase: 94 U/L (ref 33–130)
BILIRUBIN TOTAL: 0.4 mg/dL (ref 0.2–1.2)
BUN: 14 mg/dL (ref 7–25)
CO2: 31 mmol/L (ref 20–31)
Calcium: 8.8 mg/dL (ref 8.6–10.4)
Chloride: 103 mmol/L (ref 98–110)
Creat: 0.69 mg/dL (ref 0.50–1.05)
Glucose, Bld: 55 mg/dL — ABNORMAL LOW (ref 65–99)
Potassium: 4 mmol/L (ref 3.5–5.3)
SODIUM: 138 mmol/L (ref 135–146)
Total Protein: 7.5 g/dL (ref 6.1–8.1)

## 2016-10-02 LAB — HEPATITIS C ANTIBODY: HCV Ab: NEGATIVE

## 2016-10-02 LAB — LIPID PANEL
CHOL/HDL RATIO: 2.1 ratio (ref <5.0)
Cholesterol: 138 mg/dL (ref <200)
HDL: 66 mg/dL (ref >50)
LDL Cholesterol: 56 mg/dL (ref <100)
Triglycerides: 78 mg/dL (ref <150)
VLDL: 16 mg/dL (ref <30)

## 2016-10-02 LAB — CBC
HCT: 35 % (ref 35.0–45.0)
HEMOGLOBIN: 11.2 g/dL — AB (ref 11.7–15.5)
MCH: 28.9 pg (ref 27.0–33.0)
MCHC: 32 g/dL (ref 32.0–36.0)
MCV: 90.2 fL (ref 80.0–100.0)
MPV: 10.5 fL (ref 7.5–12.5)
Platelets: 226 10*3/uL (ref 140–400)
RBC: 3.88 MIL/uL (ref 3.80–5.10)
RDW: 14.2 % (ref 11.0–15.0)
WBC: 3.4 10*3/uL — AB (ref 3.8–10.8)

## 2016-10-02 LAB — HEPATITIS B SURFACE ANTIGEN: Hepatitis B Surface Ag: NEGATIVE

## 2016-10-02 LAB — HEPATITIS A ANTIBODY, TOTAL: Hep A Total Ab: REACTIVE — AB

## 2016-10-02 LAB — T-HELPER CELL (CD4) - (RCID CLINIC ONLY)
CD4 % Helper T Cell: 32 % — ABNORMAL LOW (ref 33–55)
CD4 T Cell Abs: 550 /uL (ref 400–2700)

## 2016-10-02 LAB — HEPATITIS B SURFACE ANTIBODY,QUALITATIVE: HEP B S AB: NEGATIVE

## 2016-10-02 LAB — TSH: TSH: 3.96 m[IU]/L

## 2016-10-02 NOTE — Assessment & Plan Note (Signed)
Will set her up with dental.

## 2016-10-02 NOTE — Assessment & Plan Note (Addendum)
She has new HIV+.  Felt like she has made bad choices in past- sex partners. No relationships in last 3 years.  Her prev partner will test and be honest with her.  Has disclosed to her children, o/w feels isolated. Will get her in with Bernette RedbirdKenny Given condoms Set up for PAP, mammo Refuses flu rtc in 2 weeks.

## 2016-10-02 NOTE — Assessment & Plan Note (Signed)
Will check TSH

## 2016-10-02 NOTE — Progress Notes (Signed)
   Subjective:    Patient ID: Claudia Brady, female    DOB: [birth year], 56 y.o.   MRN: [id number]  HPI 56 yo F with newly dx HIV+.  She has no other medical issues aside from needing to loose wt (by her adm). Has been feeling well.    Has been walking but needs someone to "motivate me".   Has OSA with CPAP but does not use due to claustraphobia.   No results found for: HIV1RNAQUANT, HIV1RNAVL, CD4TABS  The past medical history, family history and social history were reviewed/updated in EPIC Last PAP/Mammo 2015  Review of Systems  Constitutional: Negative for appetite change, chills, fever and unexpected weight change.  Respiratory: Negative for cough and shortness of breath.   Cardiovascular: Negative for chest pain.  Gastrointestinal: Negative for constipation and diarrhea.  Genitourinary: Negative for difficulty urinating and menstrual problem.  Neurological: Negative for headaches.  has never had issues with HTN prior. Has been told over last 2 years that it has been up.      Objective:   Physical Exam  Constitutional: She appears well-developed and well-nourished.  HENT:  Mouth/Throat: Abnormal dentition. No oropharyngeal exudate.  Eyes: EOM are normal. Pupils are equal, round, and reactive to light.  Neck: Neck supple.  Cardiovascular: Normal rate, regular rhythm and normal heart sounds.   Pulmonary/Chest: Effort normal and breath sounds normal.  Abdominal: Soft. Bowel sounds are normal. There is no tenderness. There is no rebound.  Musculoskeletal: She exhibits no edema.  Lymphadenopathy:    She has no cervical adenopathy.        Assessment & Plan:

## 2016-10-02 NOTE — Addendum Note (Signed)
Addended by: Andree CossHOWELL, Romario Tith M on: 10/02/2016 11:10 AM   Modules accepted: Orders

## 2016-10-02 NOTE — Assessment & Plan Note (Addendum)
Will check her ECG today.  Add: Sinus brady 52 bpm.

## 2016-10-03 LAB — RPR

## 2016-10-05 ENCOUNTER — Encounter: Payer: Self-pay | Admitting: Infectious Diseases

## 2016-10-05 LAB — URINE CYTOLOGY ANCILLARY ONLY
CHLAMYDIA, DNA PROBE: NEGATIVE
Neisseria Gonorrhea: NEGATIVE

## 2016-10-06 LAB — HIV-1 RNA,QN PCR W/REFLEX GENOTYPE
HIV-1 RNA, QN PCR: 16400 Copies/mL — ABNORMAL HIGH
HIV-1 RNA, QN PCR: 4.21 {Log_copies}/mL — AB

## 2016-10-09 LAB — HIV-1 INTEGRASE GENOTYPE

## 2016-10-09 LAB — HLA B*5701: HLA-B 5701 W/RFLX HLA-B HIGH: NEGATIVE

## 2016-10-12 ENCOUNTER — Encounter: Payer: Self-pay | Admitting: Licensed Clinical Social Worker

## 2016-10-16 ENCOUNTER — Encounter: Payer: Self-pay | Admitting: Infectious Diseases

## 2016-10-16 ENCOUNTER — Telehealth: Payer: Self-pay | Admitting: *Deleted

## 2016-10-16 NOTE — Telephone Encounter (Signed)
Patient sent a my chart message for explanation of her lab results. Advised that Dr. Ninetta Lights will go over all these results with her when she comes in on 10/23/16. She is still struggling with her diagnosis and I scheduled her an appointment to see Bernette Redbird on 10/26/16. I told her that we also have a support group that meets here at the clinic every 2nd and 4th Friday of the month. She is interested in group.

## 2016-10-21 LAB — OTHER SOLSTAS TEST

## 2016-10-21 LAB — HIV-1 GENOTYPR PLUS

## 2016-10-23 ENCOUNTER — Other Ambulatory Visit: Payer: Self-pay | Admitting: Infectious Diseases

## 2016-10-23 ENCOUNTER — Ambulatory Visit (INDEPENDENT_AMBULATORY_CARE_PROVIDER_SITE_OTHER): Payer: Self-pay | Admitting: *Deleted

## 2016-10-23 ENCOUNTER — Encounter: Payer: Self-pay | Admitting: Infectious Diseases

## 2016-10-23 ENCOUNTER — Ambulatory Visit (INDEPENDENT_AMBULATORY_CARE_PROVIDER_SITE_OTHER): Payer: Self-pay | Admitting: Infectious Diseases

## 2016-10-23 VITALS — BP 151/91 | HR 64 | Temp 98.6°F | Wt 248.0 lb

## 2016-10-23 DIAGNOSIS — G2581 Restless legs syndrome: Secondary | ICD-10-CM

## 2016-10-23 DIAGNOSIS — B20 Human immunodeficiency virus [HIV] disease: Secondary | ICD-10-CM

## 2016-10-23 DIAGNOSIS — K089 Disorder of teeth and supporting structures, unspecified: Secondary | ICD-10-CM

## 2016-10-23 DIAGNOSIS — R21 Rash and other nonspecific skin eruption: Secondary | ICD-10-CM

## 2016-10-23 DIAGNOSIS — Z113 Encounter for screening for infections with a predominantly sexual mode of transmission: Secondary | ICD-10-CM

## 2016-10-23 DIAGNOSIS — Z1231 Encounter for screening mammogram for malignant neoplasm of breast: Secondary | ICD-10-CM

## 2016-10-23 DIAGNOSIS — G4733 Obstructive sleep apnea (adult) (pediatric): Secondary | ICD-10-CM

## 2016-10-23 DIAGNOSIS — Z23 Encounter for immunization: Secondary | ICD-10-CM

## 2016-10-23 DIAGNOSIS — F321 Major depressive disorder, single episode, moderate: Secondary | ICD-10-CM

## 2016-10-23 DIAGNOSIS — I10 Essential (primary) hypertension: Secondary | ICD-10-CM | POA: Insufficient documentation

## 2016-10-23 MED ORDER — BICTEGRAVIR-EMTRICITAB-TENOFOV 50-200-25 MG PO TABS: 1.0000 | ORAL_TABLET | Freq: Every day | ORAL | 3 refills | Status: DC

## 2016-10-23 MED ORDER — CLINDAMYCIN HCL 300 MG PO CAPS: 300.0000 mg | ORAL_CAPSULE | Freq: Three times a day (TID) | ORAL | 0 refills | Status: DC

## 2016-10-23 NOTE — Assessment & Plan Note (Signed)
Will get in with PCP Not sure if her settings will be same.

## 2016-10-23 NOTE — Progress Notes (Signed)
Patient complained of blisters on her labia majora.  Obtained viral culture specimen.

## 2016-10-23 NOTE — Progress Notes (Signed)
   Subjective:    Patient ID: Claudia Brady, female    DOB: 1960/10/28, 57 y.o.   MRN: [id number]  HPI 56 yo F with newly dx HIV+. Also prev hx of hypothyroid (quit taking levothyroxine as it made her bloated). And hx of OSA. Has been off her OSA machine for several years.  Here to f/u labs, get ART started.  States she is a "non-medicine person", makes her anxious.    CD4 T Cell Abs (/uL)  Date Value  10/02/2016 550    Review of Systems  Constitutional: Positive for unexpected weight change. Negative for appetite change.  Respiratory: Positive for shortness of breath.   Cardiovascular: Positive for palpitations. Negative for chest pain.  Gastrointestinal: Negative for constipation and diarrhea.  Genitourinary: Negative for difficulty urinating and menstrual problem.  Neurological: Negative for headaches.  wt up 8# since last visit.  SOB at night.  Last pap-mammo was 2014. No menses since 2014    Objective:   Physical Exam  Constitutional: She appears well-developed and well-nourished.  HENT:  Mouth/Throat: Abnormal dentition. Dental abscesses and dental caries present. No oropharyngeal exudate.    Eyes: EOM are normal. Pupils are equal, round, and reactive to light.  Neck: Neck supple.       Assessment & Plan:

## 2016-10-23 NOTE — Assessment & Plan Note (Signed)
Will get her in with PCP.  

## 2016-10-23 NOTE — Addendum Note (Signed)
Addended by: Wendall Mola A on: 10/23/2016 11:20 AM   Modules accepted: Orders

## 2016-10-23 NOTE — Assessment & Plan Note (Addendum)
Will start her on biktarvy Have her see pharm Has condoms Partner needs testing (states he has been). Encouraged meds, PREP, condoms Pap, mammo Pneumovax and Hep B vax if she will permit rtc in 2 months, after pharm visit.

## 2016-10-23 NOTE — Patient Instructions (Signed)
Patient will look on MyChart for results.

## 2016-10-23 NOTE — Assessment & Plan Note (Signed)
Appears to have dental abescess Will give her clinda Will get her into dental.

## 2016-10-23 NOTE — Assessment & Plan Note (Signed)
c/o pain in L foot Exam unremarkable.

## 2016-10-23 NOTE — Assessment & Plan Note (Signed)
Has mental health appt on 4-16

## 2016-10-26 ENCOUNTER — Ambulatory Visit: Payer: Self-pay

## 2016-10-26 LAB — CYTOLOGY - PAP: DIAGNOSIS: NEGATIVE

## 2016-10-26 LAB — CERVICOVAGINAL ANCILLARY ONLY
Chlamydia: NEGATIVE
NEISSERIA GONORRHEA: NEGATIVE

## 2016-10-26 MED ORDER — BICTEGRAVIR-EMTRICITAB-TENOFOV 50-200-25 MG PO TABS
1.0000 | ORAL_TABLET | Freq: Every day | ORAL | 3 refills | Status: DC
Start: 2016-10-26 — End: 2017-11-30

## 2016-10-26 MED ORDER — BICTEGRAVIR-EMTRICITAB-TENOFOV 50-200-25 MG PO TABS: 1.0000 | ORAL_TABLET | Freq: Every day | ORAL | 3 refills | Status: DC

## 2016-10-26 NOTE — Progress Notes (Signed)
HPI: Claudia Brady is a 57 y.o. female who presents to the [facility] clinic today to see Dr. Ninetta Lights as a new HIV patient.   Allergies: Allergies  Allergen Reactions  . Other Rash    Patient states she is allergic to sanitary napkins.  . Penicillins Swelling and Rash    States she has taken amoxicillin without difficulty.     Past Medical History: Past Medical History:  Diagnosis Date  . Chronic pain   . Duodenal ulcer Jan. 2012   Upper GI bleeding 2nd to NSAID use  . GERD (gastroesophageal reflux disease)   . Iron deficiency anemia   . OSA (obstructive sleep apnea)   . Plantar fascia syndrome   . Rhinitis   . Varicose vein of leg     Social History: Social History   Social History  . Marital status: Single    Spouse name: N/A  . Number of children: N/A  . Years of education: N/A   Social History Main Topics  . Smoking status: Former Smoker    Years: 15.00    Types: Cigarettes  . Smokeless tobacco: Never Used     Comment: 2 cigs a day  . Alcohol use No  . Drug use: No  . Sexual activity: No   Other Topics Concern  . None   Social History Narrative  . None    Current Regimen: None  Labs: CD4 T Cell Abs (/uL)  Date Value  10/02/2016 550   Hep B S Ab (no units)  Date Value  10/02/2016 NEG   Hepatitis B Surface Ag (no units)  Date Value  10/02/2016 NEGATIVE   HCV Ab (no units)  Date Value  10/02/2016 NEGATIVE    CrCl: CrCl cannot be calculated (Patient's most recent lab result is older than the maximum 21 days allowed.).  Lipids:    Component Value Date/Time   CHOL 138 10/02/2016 1037   TRIG 78 10/02/2016 1037   HDL 66 10/02/2016 1037   CHOLHDL 2.1 10/02/2016 1037   VLDL 16 10/02/2016 1037   LDLCALC 56 10/02/2016 1037    Assessment: Claudia Brady is here today to follow-up with Dr. Ninetta Lights.  She is a new HIV patient and has not been started on medications yet.  She applied for ADAP several weeks ago and is still waiting on approval.  I  explained the different medication options to her, and she would like to be on Biktarvy.  I explained the medication to her.  I would have really liked to get [facility] for her and start her ASAP but Susanne Borders is not on [employer] yet, and she refused to try anything else.  She would rather wait than start Genvoya or another medication. I will follow back up on when her ADAP is approved and get her back to see me 3-4 weeks after starting her medications. She agrees with this plan.  She is anxious about her partner and transmitting HIV to him.  I spent some time going over ways to prevent HIV transmission and told her the best thing to do would be to get her boyfriend on PrEP. She will bring him to the next appointment with me and we can discuss PrEP at that time too.  Plans: - Biktarvy when ADAP approved - Come back and see me in a month after starting medications  Cassie L. Kuppelweiser, PharmD, CPP Infectious Diseases Clinical Pharmacist [facility] 10/26/2016, 9:26 AM

## 2016-10-26 NOTE — Addendum Note (Signed)
Addended by: Aggie Cosier L on: 10/26/2016 10:22 AM   Modules accepted: Orders

## 2016-10-28 LAB — HERPES SIMPLEX VIRUS CULTURE: ORGANISM ID, BACTERIA: DETECTED

## 2016-11-04 ENCOUNTER — Ambulatory Visit (INDEPENDENT_AMBULATORY_CARE_PROVIDER_SITE_OTHER): Payer: Self-pay

## 2016-11-04 DIAGNOSIS — F4323 Adjustment disorder with mixed anxiety and depressed mood: Secondary | ICD-10-CM

## 2016-11-04 NOTE — BH Specialist Note (Signed)
I met with Claudia Brady today for the first time and she was tearful at times, talking about how things have not been going her way for a while. She was diagnosed with HIV in February and is just now going today to get her first bottle of medication for it, due to just getting approved for ADAP. She reports poor sleep, up and down appetite, depressed mood, anxiety (6 on a 10 pt scale). I gave her the basics of breathing techniques to reduce anxiety. I also informed her of my leaving in 2 weeks and gave her contact information on [facility] [place].  Curley Spice, LCSW

## 2016-11-09 ENCOUNTER — Telehealth: Payer: Self-pay | Admitting: Pharmacist

## 2016-11-09 NOTE — Telephone Encounter (Signed)
Called Claudia Brady about her ADAP being approved.  She said she picked up Biktarvy last week but went out of town and has not started.  She will start tonight.  Counseled again on how to take.  She will come back and see me in ~1 month.

## 2016-12-08 ENCOUNTER — Other Ambulatory Visit: Payer: Self-pay

## 2016-12-08 ENCOUNTER — Ambulatory Visit (INDEPENDENT_AMBULATORY_CARE_PROVIDER_SITE_OTHER): Payer: Self-pay | Admitting: Pharmacist

## 2016-12-08 DIAGNOSIS — B20 Human immunodeficiency virus [HIV] disease: Secondary | ICD-10-CM

## 2016-12-08 NOTE — Progress Notes (Signed)
HPI: Claudia ProfferMarcella Brady is a 56 y.o. female who presents to the [facility] pharmacy clinic today for HIV follow-up.   Allergies: Allergies  Allergen Reactions  . Other Rash    Patient states she is allergic to sanitary napkins.  . Penicillins Swelling and Rash    States she has taken amoxicillin without difficulty.     Past Medical History: Past Medical History:  Diagnosis Date  . Chronic pain   . Duodenal ulcer Jan. 2012   Upper GI bleeding 2nd to NSAID use  . GERD (gastroesophageal reflux disease)   . Iron deficiency anemia   . OSA (obstructive sleep apnea)   . Plantar fascia syndrome   . Rhinitis   . Varicose vein of leg     Social History: Social History   Social History  . Marital status: Single    Spouse name: N/A  . Number of children: N/A  . Years of education: N/A   Social History Main Topics  . Smoking status: Former Smoker    Years: 15.00    Types: Cigarettes  . Smokeless tobacco: Never Used     Comment: 2 cigs a day  . Alcohol use No  . Drug use: No  . Sexual activity: No   Other Topics Concern  . Not on file   Social History Narrative  . No narrative on file    Current Regimen: Biktarvy  Labs: CD4 T Cell Abs (/uL)  Date Value  10/02/2016 550   Hep B S Ab (no units)  Date Value  10/02/2016 NEG   Hepatitis B Surface Ag (no units)  Date Value  10/02/2016 NEGATIVE   HCV Ab (no units)  Date Value  10/02/2016 NEGATIVE    CrCl: CrCl cannot be calculated (Patient's most recent lab result is older than the maximum 21 days allowed.).  Lipids:    Component Value Date/Time   CHOL 138 10/02/2016 1037   TRIG 78 10/02/2016 1037   HDL 66 10/02/2016 1037   CHOLHDL 2.1 10/02/2016 1037   VLDL 16 10/02/2016 1037   LDLCALC 56 10/02/2016 1037    Assessment: Claudia Brady is here today for HIV follow-up. She started Biktarvy ~1 month ago.  She tells me she isn't doing too well today.  She is having some depression and anxiety lately.  She has started a  few new medications in the last month besides Biktarvy - lisinopril/HCTZ for her BP and gabapentin for her neuropathy.  Before she started the BP medication, her BP was 171/101.  Today it is 135/84.  She is excited that it is down.  She does say that she is still having trouble with the diagnosis and sharing it with her mother and sisters. She has shared the diagnosis with her son and daughter.  She is also homeless at this moment - living with her daughter and thinks she is a burden.  I spent a long time talking with her as she just wanted someone to listen and talk to her.  She did see Claudia Brady back before he left, and I will make sure she makes an appointment with Claudia Brady before leaving. She is no longer with the boyfriend she told me about at the previous visit.   She has not missed any doses of her Biktarvy and thinks it is working well for her.  No side effects associated with that.  Although, she is experiencing weight gain (likely from the gabapentin), and dizzy/lightheadedness (likely from the lisinopril/HCTZ). She was in better spirits after she  left.  I will check labs today.  She did ask if she could take Lipozene for weight loss.  Told her to separate it out form her Biktarvy.   Plans: - Continue Biktarvy - HIV VL today - F/u with Claudia Brady 6/6 at 2:30pm - F/u with Claudia Brady 6/11 at 1:45pm  Claudia Brady, PharmD, CPP Infectious Diseases Clinical Pharmacist [facility] 12/08/2016, 3:40 PM

## 2016-12-10 LAB — HIV-1 RNA QUANT-NO REFLEX-BLD
HIV 1 RNA QUANT: DETECTED {copies}/mL — AB
HIV-1 RNA Quant, Log: <1.3 Log copies/mL — AB

## 2016-12-16 ENCOUNTER — Ambulatory Visit (INDEPENDENT_AMBULATORY_CARE_PROVIDER_SITE_OTHER): Payer: Self-pay | Admitting: Licensed Clinical Social Worker

## 2016-12-16 DIAGNOSIS — F321 Major depressive disorder, single episode, moderate: Secondary | ICD-10-CM

## 2016-12-21 ENCOUNTER — Ambulatory Visit (INDEPENDENT_AMBULATORY_CARE_PROVIDER_SITE_OTHER): Payer: Self-pay | Admitting: Infectious Diseases

## 2016-12-21 ENCOUNTER — Encounter: Payer: Self-pay | Admitting: Infectious Diseases

## 2016-12-21 VITALS — BP 125/85 | HR 64 | Temp 97.8°F | Ht 65.0 in | Wt 268.0 lb

## 2016-12-21 DIAGNOSIS — M2140 Flat foot [pes planus] (acquired), unspecified foot: Secondary | ICD-10-CM

## 2016-12-21 DIAGNOSIS — Z23 Encounter for immunization: Secondary | ICD-10-CM

## 2016-12-21 DIAGNOSIS — I10 Essential (primary) hypertension: Secondary | ICD-10-CM

## 2016-12-21 DIAGNOSIS — K089 Disorder of teeth and supporting structures, unspecified: Secondary | ICD-10-CM

## 2016-12-21 DIAGNOSIS — B20 Human immunodeficiency virus [HIV] disease: Secondary | ICD-10-CM

## 2016-12-21 DIAGNOSIS — Z0000 Encounter for general adult medical examination without abnormal findings: Secondary | ICD-10-CM

## 2016-12-21 HISTORY — DX: Flat foot (pes planus) (acquired), unspecified foot: M21.40

## 2016-12-21 NOTE — Assessment & Plan Note (Signed)
Was seen in dental today and had multiple extractions.

## 2016-12-21 NOTE — Assessment & Plan Note (Signed)
bp fine today off meds. Will add ACE-I to Allergy list.

## 2016-12-21 NOTE — Progress Notes (Signed)
   Subjective:    Patient ID: Claudia Brady, female    DOB: [birth year], 56 y.o.   MRN: [id number]  HPI 56 yo F with HIV+ dx 09-2016. She also has hx of OSA.  At her f/u visit she was started on biktarvy.  She was recently started on lisinopril-HCTZ and has had chronic cough. Quit 2 days ago and BP today is normal.   Has been emotional, crying a lot. Flight of ideas. Wt is up 20#, is eating better.   No problems with Biktarvy.    HIV 1 RNA Quant (copies/mL)  Date Value  12/08/2016 <20 DETECTED (A)   CD4 T Cell Abs (/uL)  Date Value  10/02/2016 550    Review of Systems  Constitutional: Negative for appetite change, chills, fever and unexpected weight change.  Respiratory: Positive for cough.   Gastrointestinal: Negative for constipation and diarrhea.  Genitourinary: Negative for difficulty urinating.  Musculoskeletal: Positive for arthralgias.  Psychiatric/Behavioral: Positive for sleep disturbance.  sleeps 4h/night. Stays awake with "mind swimming, dreams...baggage". Feels isolated, has difficulty with people to talk to.  Spoke with Sherrie on 6-8. [facility]  Chronic pain in L foot- across her arch. Last 4 years.  Thinks she needs a brace to help her stand longer. 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  HENT:  Mouth/Throat: No oropharyngeal exudate.  Eyes: EOM are normal. Pupils are equal, round, and reactive to light.  Neck: Neck supple.  Cardiovascular: Normal rate, regular rhythm and normal heart sounds.   Pulmonary/Chest: Effort normal and breath sounds normal.  Abdominal: Soft. Bowel sounds are normal. There is no tenderness. There is no rebound.  Musculoskeletal: She exhibits no edema or tenderness.  Lymphadenopathy:    She has no cervical adenopathy.  Psychiatric: Her mood appears anxious. She exhibits a depressed mood.  Tearful.        Assessment & Plan:

## 2016-12-21 NOTE — Assessment & Plan Note (Signed)
[facility]

## 2016-12-21 NOTE — Assessment & Plan Note (Signed)
Will see if we can get her into podiatry.

## 2016-12-21 NOTE — Addendum Note (Signed)
Addended by: Andree CossHOWELL, Dakotah Heiman M on: 12/21/2016 03:01 PM   Modules accepted: Orders

## 2016-12-21 NOTE — Assessment & Plan Note (Addendum)
She is doing well.  Next hep B today mening- aged out Offered/refused condoms.  Not sexually active.  rtc in 4 months due to mood issues.

## 2016-12-23 NOTE — Progress Notes (Signed)
Integrated Behavioral Health Initial Visit  MRN: [id number] Name: Claudia Brady   Session Start time: 2:35 pm Session End time: 3:20 pm Total time: 45 minutes  Type of Service: Integrated Behavioral Health- Individual/Family Interpretor:No. Interpretor Name and Language: N/A   Warm Hand Off Completed.       SUBJECTIVE: Claudia Brady is a 56 y.o. female accompanied by patient. Patient was referred by Dr. Ninetta LightsHatcher for depressive symptoms. Patient reports the following symptoms/concerns: Patient is newly diagnosed in Feb 2018 and feels like she is still hiding her status.  Patient reports difficulty adjusting to her diagnosis and is ruminating on her status.  Patient also reported: guilt, sadness, feeling "mad" at self, anhedonia, social isolation, increased appetite, fatigue and "not moving fast enough", and distractibility leading to disorganization.  Additionally the patient recently lost her housing and is living with her daughter, which is stressful because she wants to be independent.  Patient shared that she recently moved back to [place] three years ago, but now feels the urge to flee to another city to "reset".  Patient reported that [facility], but she missed it because she over slept.  Patient reported and was encouraged to reschedule appointment there.  Duration of problem: 4 months; Severity of problem: moderate  OBJECTIVE: Mood: Depressed and Worthless and Affect: Tearful Risk of harm to self or others: No plan to harm self or others   LIFE CONTEXT: Family and Social: Patient is forced to live with her daughter because she was unable to pay her rent due to decreased part-time work. School/Work: Patient is only working part-time hours and states this is not enough income to gain housing and pay bills. Self-Care: Patient is able to tend to her ADL's. Life Changes: Patient was  recently evicted due to non-payment of rent.  GOALS ADDRESSED: Patient will reduce symptoms of: depression and increase knowledge and/or ability of: coping skills, self-management skills and stress reduction and also: Increase healthy adjustment to current life circumstances   INTERVENTIONS: Supportive Counseling and Link to [facility]   ASSESSMENT: Patient currently experiencing moderate depressive symptoms. Patient may benefit from community mental health treatment and medication management.  PLAN: 1. Referral(s): [facility]Outside Clinic[facility].  Patient will re-schedule previously scheduled appointment. 2. [facility] contact [facility] if she is unable to access mental health services in the community.   Vergia AlbertsSherry Haivyn Oravec, [facility]

## 2017-01-01 ENCOUNTER — Ambulatory Visit: Payer: Self-pay

## 2017-04-05 ENCOUNTER — Other Ambulatory Visit: Payer: Self-pay

## 2017-04-05 DIAGNOSIS — B20 Human immunodeficiency virus [HIV] disease: Secondary | ICD-10-CM

## 2017-04-06 LAB — T-HELPER CELL (CD4) - (RCID CLINIC ONLY)
CD4 % Helper T Cell: 38 % (ref 33–55)
CD4 T Cell Abs: 680 /uL (ref 400–2700)

## 2017-04-07 LAB — HIV-1 RNA QUANT-NO REFLEX-BLD
HIV 1 RNA Quant: <20 copies/mL — AB
HIV-1 RNA Quant, Log: <1.3 Log copies/mL — AB

## 2017-04-19 ENCOUNTER — Encounter: Payer: Self-pay | Admitting: Infectious Diseases

## 2017-04-19 ENCOUNTER — Ambulatory Visit (INDEPENDENT_AMBULATORY_CARE_PROVIDER_SITE_OTHER): Payer: Self-pay | Admitting: Infectious Diseases

## 2017-04-19 VITALS — BP 129/83 | HR 86 | Temp 98.6°F | Wt 295.0 lb

## 2017-04-19 DIAGNOSIS — Z23 Encounter for immunization: Secondary | ICD-10-CM

## 2017-04-19 DIAGNOSIS — B20 Human immunodeficiency virus [HIV] disease: Secondary | ICD-10-CM

## 2017-04-19 DIAGNOSIS — Z79899 Other long term (current) drug therapy: Secondary | ICD-10-CM

## 2017-04-19 DIAGNOSIS — Z6841 Body Mass Index (BMI) 40.0 and over, adult: Secondary | ICD-10-CM

## 2017-04-19 DIAGNOSIS — K089 Disorder of teeth and supporting structures, unspecified: Secondary | ICD-10-CM

## 2017-04-19 DIAGNOSIS — Z113 Encounter for screening for infections with a predominantly sexual mode of transmission: Secondary | ICD-10-CM

## 2017-04-19 DIAGNOSIS — F321 Major depressive disorder, single episode, moderate: Secondary | ICD-10-CM

## 2017-04-19 NOTE — Assessment & Plan Note (Signed)
Has dental appt next week.

## 2017-04-19 NOTE — Assessment & Plan Note (Addendum)
She is doing well Flu vax today.  Offered/refused condoms. She is single now.  Will see her back in 9 months.  mammo next month Will set her up for PAP

## 2017-04-19 NOTE — Assessment & Plan Note (Signed)
encouraged her to watch diet Encouraged her to exercise Will have her seen by nutrition.

## 2017-04-19 NOTE — Progress Notes (Signed)
   Subjective:    Patient ID: Claudia Brady, female    DOB: 05/12/1961, 56 y.o.   MRN: [id number]  HPI 56 yo F with HIV+ 09-2016 as well as OSA, depression.  Was started on biktarvy.  Has been feeling heavy. Has wt gained 60# since in ART. Feels like she eats more when she has her "sad days". Has been going to the gym, walking.  Had nl TSH 09-2016.  Has previous lost wt when she re-started smoking. Has no cravings now.   HIV 1 RNA Quant (copies/mL)  Date Value  04/05/2017 <20 DETECTED (A)  12/08/2016 <20 DETECTED (A)   CD4 T Cell Abs (/uL)  Date Value  04/05/2017 680  10/02/2016 550    Review of Systems  Constitutional: Positive for unexpected weight change. Negative for appetite change, chills and fever.  Respiratory: Negative for cough and shortness of breath.   Gastrointestinal: Negative for constipation and diarrhea.  Genitourinary: Negative for difficulty urinating and menstrual problem.  Musculoskeletal: Positive for arthralgias.  Psychiatric/Behavioral: Negative for sleep disturbance.  sees foot "therapy" in [facility].  Mood has been "up and down".  Has been followed by Mardella Layman at [facility]. Please see HPI. 12 point ROS o/w (-)     Objective:   Physical Exam  Constitutional: She appears well-developed and well-nourished.  HENT:  Mouth/Throat: No oropharyngeal exudate.  Eyes: Pupils are equal, round, and reactive to light. EOM are normal.  Neck: Neck supple.  Cardiovascular: Normal rate, regular rhythm and normal heart sounds.   Pulmonary/Chest: Effort normal and breath sounds normal.  Abdominal: Soft. Bowel sounds are normal. There is no tenderness. There is no rebound.  Musculoskeletal: She exhibits no edema.  Lymphadenopathy:    She has no cervical adenopathy.  Psychiatric: She has a normal mood and affect.          Assessment & Plan:

## 2017-04-19 NOTE — Assessment & Plan Note (Signed)
Has f/u at [facility].  Appreciate their eval.

## 2017-04-21 ENCOUNTER — Encounter: Payer: Self-pay | Admitting: Infectious Diseases

## 2017-04-30 ENCOUNTER — Ambulatory Visit: Payer: Self-pay | Admitting: Infectious Diseases

## 2017-05-06 ENCOUNTER — Other Ambulatory Visit: Payer: Self-pay | Admitting: Obstetrics and Gynecology

## 2017-05-06 DIAGNOSIS — Z1231 Encounter for screening mammogram for malignant neoplasm of breast: Secondary | ICD-10-CM

## 2017-05-18 ENCOUNTER — Other Ambulatory Visit (HOSPITAL_COMMUNITY): Payer: Self-pay | Admitting: *Deleted

## 2017-05-18 DIAGNOSIS — N6320 Unspecified lump in the left breast, unspecified quadrant: Secondary | ICD-10-CM

## 2017-05-20 ENCOUNTER — Other Ambulatory Visit: Payer: Self-pay

## 2017-05-20 ENCOUNTER — Ambulatory Visit
Admission: RE | Admit: 2017-05-20 | Discharge: 2017-05-20 | Disposition: A | Discharge status: Home / Self Care | Payer: No Typology Code available for payment source | Source: Ambulatory Visit | Attending: Obstetrics and Gynecology | Admitting: Obstetrics and Gynecology

## 2017-05-20 ENCOUNTER — Ambulatory Visit: Payer: Self-pay

## 2017-05-20 ENCOUNTER — Ambulatory Visit (HOSPITAL_COMMUNITY)
Admission: RE | Admit: 2017-05-20 | Discharge: 2017-05-20 | Disposition: A | Discharge status: Home / Self Care | Payer: Self-pay | Source: Ambulatory Visit | Attending: Obstetrics and Gynecology | Admitting: Obstetrics and Gynecology

## 2017-05-20 ENCOUNTER — Encounter (HOSPITAL_COMMUNITY): Payer: Self-pay

## 2017-05-20 VITALS — BP 136/84 | Temp 98.6°F | Ht 65.0 in | Wt 302.0 lb

## 2017-05-20 DIAGNOSIS — Z1239 Encounter for other screening for malignant neoplasm of breast: Secondary | ICD-10-CM

## 2017-05-20 DIAGNOSIS — N6320 Unspecified lump in the left breast, unspecified quadrant: Secondary | ICD-10-CM

## 2017-05-20 HISTORY — DX: Essential (primary) hypertension: I10

## 2017-05-20 NOTE — Progress Notes (Signed)
Patient complained of a mild upper right breast pain this morning that resolved prior to appointment.   Pap Smear: Pap smear not completed today. Last Pap smear was 10/23/2016 at [facility] and normal. Per patient has no history of an abnormal Pap smear. Last Pap smear result is in Epic.  Physical exam: Breasts Breasts symmetrical. No skin abnormalities bilateral breasts. No nipple retraction bilateral breasts. No nipple discharge bilateral breasts. No lymphadenopathy. No lumps palpated bilateral breasts. No complaints of pain or tenderness on exam. [facility] per recommendation from previous mammogram completed 11/07/2010 that a left breast diagnostic mammogram and ultrasound was recommended for follow-up. Appointment scheduled for Thursday, May 20, 2017 at 1150.        Pelvic/Bimanual No Pap smear completed today since last Pap smear was 10/23/2016. Pap smear not indicated per BCCCP guidelines.   Smoking History: Patient is a former smoker that quit 02/24/2016 per patient.  Patient Navigation: Patient education provided. Access to services provided for patient through BCCCP program.   Colorectal Cancer Screening: Per patient had a colon[place] in [place] in [place]. No complaints today. FIT Test given to patient to complete and return to B[facility]CP.

## 2017-05-20 NOTE — Patient Instructions (Signed)
Explained breast self awareness with Claudia Brady. Patient did not need a Pap smear today due to last Pap smear was 10/23/2016. Let her know BCCCP will cover Pap smears every 3 years unless has a history of abnormal Pap smears. Due to patient's history of HIV let her know that yearly Pap smears are recommended. [facility] per recommendation from previous mammogram completed 11/07/2010 that a left breast diagnostic mammogram and ultrasound was recommended for follow-up. Appointment scheduled for Thursday, May 20, 2017 at 1150. Claudia Brady verbalized understanding.  Zaydon Kinser, Kathaleen Maserhristine Poll, RN 12:22 PM

## 2017-05-21 ENCOUNTER — Encounter (HOSPITAL_COMMUNITY): Payer: Self-pay | Admitting: *Deleted

## 2017-05-21 ENCOUNTER — Other Ambulatory Visit: Payer: Self-pay | Admitting: Obstetrics and Gynecology

## 2017-06-01 ENCOUNTER — Ambulatory Visit: Payer: Self-pay | Admitting: Podiatry

## 2017-06-02 LAB — WET PREP FOR TRICH, YEAST, CLUE

## 2017-06-02 LAB — FECAL OCCULT BLOOD, IMMUNOCHEMICAL: FECAL OCCULT BLD: NEGATIVE

## 2017-06-07 ENCOUNTER — Encounter (HOSPITAL_COMMUNITY): Payer: Self-pay | Admitting: *Deleted

## 2017-06-07 NOTE — Progress Notes (Signed)
Letter mailed with negative Fit Test results.  

## 2017-11-30 ENCOUNTER — Encounter: Payer: Self-pay | Admitting: Infectious Diseases

## 2017-11-30 ENCOUNTER — Other Ambulatory Visit: Payer: Self-pay | Admitting: Infectious Diseases

## 2017-11-30 ENCOUNTER — Other Ambulatory Visit: Payer: Self-pay | Admitting: Behavioral Health

## 2017-11-30 DIAGNOSIS — B20 Human immunodeficiency virus [HIV] disease: Secondary | ICD-10-CM

## 2017-11-30 DIAGNOSIS — Z21 Asymptomatic human immunodeficiency virus [HIV] infection status: Secondary | ICD-10-CM

## 2017-11-30 MED ORDER — BICTEGRAVIR-EMTRICITAB-TENOFOV 50-200-25 MG PO TABS: 1.0000 | ORAL_TABLET | Freq: Every day | ORAL | 1 refills | Status: DC

## 2017-11-30 MED ORDER — BICTEGRAVIR-EMTRICITAB-TENOFOV 50-200-25 MG PO TABS: 1.0000 | ORAL_TABLET | Freq: Every day | ORAL | 0 refills | Status: DC

## 2018-01-03 ENCOUNTER — Other Ambulatory Visit (HOSPITAL_COMMUNITY)
Admission: RE | Admit: 2018-01-03 | Discharge: 2018-01-03 | Disposition: A | Discharge status: Home / Self Care | Payer: Medicaid Other | Source: Ambulatory Visit | Attending: Infectious Diseases | Admitting: Infectious Diseases

## 2018-01-03 ENCOUNTER — Other Ambulatory Visit: Payer: Medicaid Other

## 2018-01-03 DIAGNOSIS — Z113 Encounter for screening for infections with a predominantly sexual mode of transmission: Secondary | ICD-10-CM | POA: Insufficient documentation

## 2018-01-03 DIAGNOSIS — Z79899 Other long term (current) drug therapy: Secondary | ICD-10-CM

## 2018-01-03 DIAGNOSIS — B20 Human immunodeficiency virus [HIV] disease: Secondary | ICD-10-CM

## 2018-01-04 ENCOUNTER — Encounter: Payer: Self-pay | Admitting: Infectious Diseases

## 2018-01-04 ENCOUNTER — Other Ambulatory Visit: Payer: Self-pay | Admitting: *Deleted

## 2018-01-04 DIAGNOSIS — B20 Human immunodeficiency virus [HIV] disease: Secondary | ICD-10-CM

## 2018-01-04 LAB — T-HELPER CELL (CD4) - (RCID CLINIC ONLY)
CD4 % Helper T Cell: 39 % (ref 33–55)
CD4 T Cell Abs: 730 /uL (ref 400–2700)

## 2018-01-04 LAB — URINE CYTOLOGY ANCILLARY ONLY
Chlamydia: NEGATIVE
Neisseria Gonorrhea: NEGATIVE

## 2018-01-04 MED ORDER — BICTEGRAVIR-EMTRICITAB-TENOFOV 50-200-25 MG PO TABS: 1.0000 | ORAL_TABLET | Freq: Every day | ORAL | 1 refills | Status: DC

## 2018-01-06 ENCOUNTER — Other Ambulatory Visit: Payer: No Typology Code available for payment source

## 2018-01-06 LAB — HIV-1 RNA QUANT-NO REFLEX-BLD
HIV 1 RNA QUANT: 27 {copies}/mL — AB
HIV-1 RNA Quant, Log: 1.43 Log copies/mL — ABNORMAL HIGH

## 2018-01-10 ENCOUNTER — Encounter: Payer: Self-pay | Admitting: Infectious Diseases

## 2018-01-17 ENCOUNTER — Ambulatory Visit (INDEPENDENT_AMBULATORY_CARE_PROVIDER_SITE_OTHER): Payer: Medicaid Other | Admitting: Infectious Diseases

## 2018-01-17 ENCOUNTER — Ambulatory Visit (INDEPENDENT_AMBULATORY_CARE_PROVIDER_SITE_OTHER): Payer: Medicaid Other | Admitting: Licensed Clinical Social Worker

## 2018-01-17 ENCOUNTER — Encounter: Payer: Self-pay | Admitting: Infectious Diseases

## 2018-01-17 VITALS — BP 177/93 | HR 66 | Temp 97.8°F | Ht 65.0 in | Wt 326.0 lb

## 2018-01-17 DIAGNOSIS — B20 Human immunodeficiency virus [HIV] disease: Secondary | ICD-10-CM

## 2018-01-17 DIAGNOSIS — F321 Major depressive disorder, single episode, moderate: Secondary | ICD-10-CM

## 2018-01-17 DIAGNOSIS — E079 Disorder of thyroid, unspecified: Secondary | ICD-10-CM | POA: Diagnosis not present

## 2018-01-17 DIAGNOSIS — R51 Headache: Secondary | ICD-10-CM | POA: Diagnosis not present

## 2018-01-17 DIAGNOSIS — I10 Essential (primary) hypertension: Secondary | ICD-10-CM | POA: Diagnosis not present

## 2018-01-17 DIAGNOSIS — Z23 Encounter for immunization: Secondary | ICD-10-CM

## 2018-01-17 DIAGNOSIS — Z113 Encounter for screening for infections with a predominantly sexual mode of transmission: Secondary | ICD-10-CM | POA: Diagnosis not present

## 2018-01-17 DIAGNOSIS — Z6841 Body Mass Index (BMI) 40.0 and over, adult: Secondary | ICD-10-CM

## 2018-01-17 DIAGNOSIS — R519 Headache, unspecified: Secondary | ICD-10-CM

## 2018-01-17 DIAGNOSIS — R635 Abnormal weight gain: Secondary | ICD-10-CM

## 2018-01-17 DIAGNOSIS — Z21 Asymptomatic human immunodeficiency virus [HIV] infection status: Secondary | ICD-10-CM

## 2018-01-17 DIAGNOSIS — Z79899 Other long term (current) drug therapy: Secondary | ICD-10-CM

## 2018-01-17 DIAGNOSIS — K089 Disorder of teeth and supporting structures, unspecified: Secondary | ICD-10-CM

## 2018-01-17 MED ORDER — IBUPROFEN 800 MG PO TABS: 800.0000 mg | ORAL_TABLET | Freq: Two times a day (BID) | ORAL | 3 refills | Status: DC

## 2018-01-17 MED ORDER — BICTEGRAVIR-EMTRICITAB-TENOFOV 50-200-25 MG PO TABS: 1.0000 | ORAL_TABLET | Freq: Every day | ORAL | 5 refills | Status: DC

## 2018-01-17 NOTE — Assessment & Plan Note (Addendum)
Offered wt mgmt clinic. Will have to get referral from PCP as she is medicaid.  As been going to gym, exercising.

## 2018-01-17 NOTE — Assessment & Plan Note (Signed)
She is meeting with counselor today She is meeting with [facility] She is isolated.

## 2018-01-17 NOTE — Assessment & Plan Note (Addendum)
Offered/refused condoms.  Give her prevnar today Next Hep B today.  Make appt for PAP.

## 2018-01-17 NOTE — BH Specialist Note (Signed)
Integrated Behavioral Health Initial Visit  MRN: [id number] Name: Claudia Brady  Number of Integrated Behavioral Health Clinician visits:: 1/6 Session Start time: 11:50am  Session End time: 12:12pm Total time: 20 minutes  Type of Service: Integrated Behavioral Health- Individual/Family Interpretor:No. Interpretor Name and Language: n/a   Warm Hand Off Completed.       SUBJECTIVE: Claudia Brady is a 57 y.o. female accompanied by self Patient was referred by Dr Ninetta LightsHatcher for depressive symptoms. Patient reports the following symptoms/concerns: tearfulness, worry thoughts, pushing people away/wanting to isolate, guilty feelings, feeling worthless Duration of problem: 1 year; Severity of problem: moderate  OBJECTIVE: Mood: Depressed and Affect: Tearful Risk of harm to self or others: No plan to harm self or others  LIFE CONTEXT: Patient is originally from [place], where her mother and one sister still live. She and her other sister have lived in [place] for years. Sister is married to a man patient believes is controlling and would keep sister from seeing her if he knew patient is HIV+. Patient has 2 adult children, both are supportive and she is close to them. She has told them about her HIV diagnosis, but has not told her roommate, mother or sisters. Patient reports that she wants to tell the rest of her family, but every time she tries to go visit her mom with the intent to tell her, something keeps the trip from happening. Patient works full time.   GOALS ADDRESSED: Patient will: 1. Reduce symptoms of: depression 2. Demonstrate ability to: Increase healthy adjustment to current life circumstances  INTERVENTIONS: Interventions utilized: Motivational Interviewing, Brief CBT and Supportive Counseling    ASSESSMENT: Patient currently experiencing  Depressed mood, anxious thoughts, crying spells, isolation, low self-worth, thoughts of worthlessness, excessive guilt. She reports no other  episodes in the recent or remote past. 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  Patient shares that her main stressor is needing someone to talk to about the HIV diagnosis, and being unable to tell the people she most wants to tell, her mother and sisters. Counselor and patient explored reasons that she cannot tell these people. Patient indicates that it never seems to be a good time to tell her mother - she would prefer to do it in person but has been unable to get to [place] recently and when she decides to tell her on the phone mother is always delivering someone else's bad news so patient does not want to put more on her. She states that her sisters would be supportive, but the sister who lives here would tell her husband and he would use this as a reason for patient and sister not to see each other anymore. Counselor validated patient's thoughts and feelings, pointing out that these are more areas of grief on top of the HIV diagnosis. Patient shared that she doesn't want to be around anyone, because she doesn't want to have to explain her diagnosis. She further believes that no one will want to date her due to her diagnosis. Counselor guided patient in processing her own thoughts about her diagnosis, and emphasized that the healthiest relationships will come once patient has accepted her diagnosis and feels good about herself. Patient reported then that she has had a partner for 20 years, though they are not exactly committed, and that he knows about her diagnosis and still wants to be with her but "that won't work anymore". Counselor explored this with patient, who indicated that the reason it won't  work is her HIV diagnosis. Counselor pointed out that patient's levels are undetectable and that is because patient has done what she needs to to take care of herself and others she is involved with. Counselor encouraged patient to give herself credit for this, rather  than punishing herself for having HIV.    Patient may benefit from ongoing CBT to address self-worth and adjustment issues.  PLAN: 1. Follow up with behavioral health clinician on : 01/21/18@10am   Angus Palms, LCSW

## 2018-01-17 NOTE — Progress Notes (Signed)
   Subjective:    Patient ID: Claudia Brady, female    DOB: Sep 04, 1960, 57 y.o.   MRN: [id number]  HPI 57 yo F with HIV+ 09-2016 as well as OSA, depression.  Was started on biktarvy.   Has not disclosed status to anyone yet. Mood has been variable.  Needs PCP for BP rx.  Has f/u with natalie at [facility]. Trying to get involved in groups.  Has been off tobacco 2 yrs in August.  Still having issues with SOB, night time awakening.  Has gained 126# Has been getting headaches with biktarvy. Taking ibuprofen.    HIV 1 RNA Quant (copies/mL)  Date Value  01/03/2018 27 (H)  04/05/2017 <20 DETECTED (A)  12/08/2016 <20 DETECTED (A)   CD4 T Cell Abs (/uL)  Date Value  01/03/2018 730  04/05/2017 680  10/02/2016 550    Review of Systems  Constitutional: Positive for unexpected weight change. Negative for appetite change.  Respiratory: Positive for shortness of breath. Negative for cough.   Gastrointestinal: Negative for constipation and diarrhea.  Genitourinary: Negative for difficulty urinating.  "going through menopause" Mammo normal 05-2017.  [facility].  Please see HPI. All other systems reviewed and negative.     Objective:   Physical Exam  Constitutional: She is oriented to person, place, and time. She appears well-developed and well-nourished.  HENT:  Mouth/Throat: No oropharyngeal exudate.  Eyes: Pupils are equal, round, and reactive to light. EOM are normal.  Neck: Normal range of motion. Neck supple.  Cardiovascular: Normal rate, regular rhythm and normal heart sounds.  Pulmonary/Chest: Effort normal and breath sounds normal.  Abdominal: Soft. Bowel sounds are normal. There is no tenderness. There is no guarding.  Musculoskeletal: Normal range of motion. She exhibits no edema.  Lymphadenopathy:    She has no cervical adenopathy.  Neurological: She is alert and oriented to person, place, and time.  Psychiatric: She has a normal mood and affect.  she  does become tearful during the exam speaking about her illness.        Assessment & Plan:

## 2018-01-17 NOTE — Assessment & Plan Note (Signed)
Will get her in with dental.  

## 2018-01-17 NOTE — Assessment & Plan Note (Signed)
Will get her in with IM clinic.

## 2018-01-19 ENCOUNTER — Encounter: Payer: Self-pay | Admitting: Infectious Diseases

## 2018-01-19 NOTE — Addendum Note (Signed)
Addended by: Andree CossHOWELL, Clayborne Divis M on: 01/19/2018 04:32 PM   Modules accepted: Orders

## 2018-01-21 ENCOUNTER — Ambulatory Visit (INDEPENDENT_AMBULATORY_CARE_PROVIDER_SITE_OTHER): Payer: Medicaid Other | Admitting: Licensed Clinical Social Worker

## 2018-01-21 DIAGNOSIS — F321 Major depressive disorder, single episode, moderate: Secondary | ICD-10-CM

## 2018-01-21 NOTE — BH Specialist Note (Signed)
Integrated Behavioral Health Follow Up Visit  MRN: [id number] Name: Claudia Brady  Number of Integrated Behavioral Health Clinician visits: 2/6 Session Start time: 9:58am  Session End time: 10:34am Total time: 35 minutes  Type of Service: Integrated Behavioral Health- Individual/Family Interpretor:No. Interpretor Name and Language: n/a  SUBJECTIVE: Claudia Brady is a 57 y.o. female accompanied by self Patient reports the following symptoms/concerns: worry thoughts, sadness, not wanting to be around others   OBJECTIVE: Mood: Depressed and Affect: Constricted Risk of harm to self or others: No plan to harm self or others  LIFE CONTEXT: Patient reports she is not leaving the house much, and that she is not seeing much of other people. She states that it is not that she does not want to be social, but that there always seem to be obstacles and that the people she socializes with "have their own lives" so she doesn't bother them. Patient indicates that most of the time she spends out of the house is with her 672 year old grandson.   GOALS ADDRESSED: Patient will: 1.  Reduce symptoms of: depression   INTERVENTIONS: Interventions utilized:  Motivational Interviewing, Brief CBT and Supportive Counseling  ASSESSMENT: Patient currently experiencing sadness, isolation, anxiety.  She shares a list of people that she wants or feels she needs to tell about her HIV diagnosis, but states that there are reasons that keep her from telling all of them. Patient feels she needs to share her status with her roommate, for example, because they share living space but roommate is mentally handicapped and often tells others patient's information without thinking about it. She also feels the need to share the information with her work, in case she ever gets a cut or injury at work and her nieces and nephews as a cautionary tell about unprotected sex. Counselor explored with patient who she would tell if she  were basing her decision on what is best for her. Patient struggled with that thought. Counselor and patient processed patient's experience with the man from whom she contracted HIV. Patient stated that she needs him to acknowledge that he has HIV and passed it on to her, but he has not yet done that. Counselor emphasized that if patient's peace is based on someone else's decision it takes away her power to find peace. Counselor and patient explored ways that she can find peace which are under her own control.   Patient may benefit from ongoing counseling.  PLAN: 1. Patient does not yet have her work schedule but will call on Monday to schedule next session.  Angus Palmsegina Alexander, LCSW

## 2018-01-31 ENCOUNTER — Ambulatory Visit: Payer: No Typology Code available for payment source | Admitting: Infectious Diseases

## 2018-02-04 ENCOUNTER — Ambulatory Visit: Payer: Medicaid Other | Admitting: Licensed Clinical Social Worker

## 2018-02-04 ENCOUNTER — Encounter: Payer: Self-pay | Admitting: Infectious Diseases

## 2018-02-04 ENCOUNTER — Other Ambulatory Visit (HOSPITAL_COMMUNITY)
Admission: RE | Admit: 2018-02-04 | Discharge: 2018-02-04 | Disposition: A | Discharge status: Home / Self Care | Payer: Medicaid Other | Source: Ambulatory Visit | Attending: Infectious Diseases | Admitting: Infectious Diseases

## 2018-02-04 ENCOUNTER — Ambulatory Visit (INDEPENDENT_AMBULATORY_CARE_PROVIDER_SITE_OTHER): Payer: Medicaid Other | Admitting: Infectious Diseases

## 2018-02-04 DIAGNOSIS — Z1151 Encounter for screening for human papillomavirus (HPV): Secondary | ICD-10-CM | POA: Insufficient documentation

## 2018-02-04 DIAGNOSIS — N939 Abnormal uterine and vaginal bleeding, unspecified: Secondary | ICD-10-CM | POA: Insufficient documentation

## 2018-02-04 DIAGNOSIS — Z124 Encounter for screening for malignant neoplasm of cervix: Secondary | ICD-10-CM | POA: Diagnosis not present

## 2018-02-04 HISTORY — DX: Abnormal uterine and vaginal bleeding, unspecified: N93.9

## 2018-02-04 NOTE — Patient Instructions (Signed)
Will call to set up pelvic ultrasound to look at your uterus with return of bleeding.   May need to send you over to Gynecology to have them evaluate you as well pending what we find or if your bleeding returns again.   Will send pap results on MyChart.

## 2018-02-04 NOTE — Progress Notes (Signed)
      Subjective:    Claudia Brady is a 57 y.o. female here for an annual pelvic exam and pap smear.   Review of Systems: Current GYN complaints or concerns: vaginal bleeding for the first time in 6 years. Earlier this week noted small spotting on toilet paper, the 2nd day again noted, 3rd day had heavier bleeding she noticed on her underwear and felt it was coming from vagina after self-exploring. No hemorrhoids or blood when she wipes following BM. Some associated symptoms of LLQ pain and fullness in pelvis sometimes.   Patient denies any abdominal/pelvic pain, problems with bowel movements, urination, or intercourse.   Past Medical History:  Diagnosis Date  . Chronic pain   . Duodenal ulcer Jan. 2012   Upper GI bleeding 2nd to NSAID use  . GERD (gastroesophageal reflux disease)   . Hypertension   . Iron deficiency anemia   . OSA (obstructive sleep apnea)   . Plantar fascia syndrome   . Rhinitis   . Varicose vein of leg     Gynecologic History: [id number]  No LMP recorded (within years). (Menstrual status: Perimenopausal). Contraception: post menopausal status Last Pap: 4/18. Results were: normal for cytology but (+) HSV Anal Intercourse: no Last Mammogram: 10/18. Results were: normal  Objective:  Physical Exam  Constitutional: Well developed, well nourished, no acute distress. She is alert and oriented x3.  Pelvic: External genitalia is normal in appearance. The vagina is normal in appearance. The cervix is not well visualized on this exam. Some discomfort with exam. Normal expected cervical mucus present. No blood present. Bimanual exam reveals uterus that is felt to be normal size, shape, and contour but limited due to body habitus. No adnexal masses or tenderness noted. Breasts: symmetrical in contour, shape and texture.   Psych: She has a normal mood and affect.    Assessment:  Normal pelvic and bimanual exam however limited by body size. Thin prep pap was obtained  and sent for cytology with HPV and GC/C today. No blood in vaginal vault. Discussed today concerns with relapsed uterine bleeding after menopause and she would feel more comfortable with vaginal ultrasound to explore this.   Plan:  Health Maintenance =   Return in 1 year for annual pap screening unless indicated sooner. Discused continuing recommended screening every year. @ 57 yo can discuss spacing out to q3y if 3 serial negative cytologies/hpv results obtained. Discussed not recommended to stop screening with HIV+ women.   She has been counseled and instructed how to perform monthly self breast exams.  Screening mammogram to be scheduled 04-2018  Contraception / Family Planning =   Post-menopause   ?Bleeding after Menopause =   None present on exam today. Unable to see cervix to determine possibility of STI involvement - will test. Exam not consistent with fibroids but limited. She is certain this was vaginal bleeding after the more heavy days.   Discussed watchful waiting to see if it returns vs proceeding with vaginal u/s and she would prefer vaginal u/s. Will schedule.   HIV =   She will continue her Biktarvy and F/U as scheduled with Dr. Ninetta LightsHatcher for ongoing HIV care.   Rexene AlbertsStephanie Veroncia Jezek, MSN, NP-C [facility] Office: [phone] Pager: [phone]  02/04/18 11:57 AM

## 2018-02-08 LAB — CYTOLOGY - PAP
CHLAMYDIA, DNA PROBE: NEGATIVE
Diagnosis: NEGATIVE
HPV (WINDOPATH): NOT DETECTED
NEISSERIA GONORRHEA: NEGATIVE

## 2018-02-11 ENCOUNTER — Telehealth: Payer: Self-pay | Admitting: *Deleted

## 2018-02-11 NOTE — Telephone Encounter (Signed)
Patient called to check referral status for Internal Medicine (primary care, then weight management) and ultrasound order. Please contact with updates. Andree CossHowell, Michelle M, RN

## 2018-02-14 ENCOUNTER — Encounter (INDEPENDENT_AMBULATORY_CARE_PROVIDER_SITE_OTHER): Payer: Self-pay

## 2018-02-18 ENCOUNTER — Ambulatory Visit (HOSPITAL_COMMUNITY): Payer: Medicaid Other

## 2018-02-25 ENCOUNTER — Ambulatory Visit (HOSPITAL_COMMUNITY): Payer: Medicaid Other

## 2018-03-02 ENCOUNTER — Encounter: Payer: Self-pay | Admitting: Infectious Diseases

## 2018-03-25 ENCOUNTER — Ambulatory Visit: Payer: Self-pay | Admitting: Nurse Practitioner

## 2018-03-30 ENCOUNTER — Ambulatory Visit: Payer: Self-pay

## 2018-04-13 ENCOUNTER — Other Ambulatory Visit: Payer: Self-pay

## 2018-04-13 ENCOUNTER — Ambulatory Visit: Payer: Medicaid Other | Admitting: Internal Medicine

## 2018-04-13 ENCOUNTER — Encounter: Payer: Self-pay | Admitting: Internal Medicine

## 2018-04-13 VITALS — BP 146/74 | HR 99 | Temp 98.8°F | Ht 65.0 in | Wt 335.6 lb

## 2018-04-13 DIAGNOSIS — Z87891 Personal history of nicotine dependence: Secondary | ICD-10-CM | POA: Diagnosis not present

## 2018-04-13 DIAGNOSIS — R5383 Other fatigue: Secondary | ICD-10-CM | POA: Diagnosis not present

## 2018-04-13 DIAGNOSIS — F329 Major depressive disorder, single episode, unspecified: Secondary | ICD-10-CM

## 2018-04-13 DIAGNOSIS — Z6841 Body Mass Index (BMI) 40.0 and over, adult: Secondary | ICD-10-CM | POA: Diagnosis not present

## 2018-04-13 DIAGNOSIS — I10 Essential (primary) hypertension: Secondary | ICD-10-CM

## 2018-04-13 DIAGNOSIS — E079 Disorder of thyroid, unspecified: Secondary | ICD-10-CM

## 2018-04-13 DIAGNOSIS — G4733 Obstructive sleep apnea (adult) (pediatric): Secondary | ICD-10-CM

## 2018-04-13 DIAGNOSIS — Z23 Encounter for immunization: Secondary | ICD-10-CM

## 2018-04-13 DIAGNOSIS — Z79899 Other long term (current) drug therapy: Secondary | ICD-10-CM

## 2018-04-13 DIAGNOSIS — I509 Heart failure, unspecified: Secondary | ICD-10-CM

## 2018-04-13 DIAGNOSIS — I110 Hypertensive heart disease with heart failure: Secondary | ICD-10-CM

## 2018-04-13 MED ORDER — FUROSEMIDE 40 MG PO TABS: 40.0000 mg | ORAL_TABLET | Freq: Every day | ORAL | 11 refills | Status: DC

## 2018-04-13 MED ORDER — LOSARTAN POTASSIUM 50 MG PO TABS: 50.0000 mg | ORAL_TABLET | Freq: Every day | ORAL | 5 refills | Status: DC

## 2018-04-13 NOTE — Assessment & Plan Note (Signed)
She has had increased fatigue recently and has a family history of hypothyroidism. She requests to have her TSH checked today. Last check was 09/2016 and was 3.96.  - check TSH

## 2018-04-13 NOTE — Assessment & Plan Note (Signed)
BP Readings from Last 3 Encounters:  04/13/18 (!) 146/74  01/17/18 (!) 177/93  05/20/17 136/84   She previously was on Losartan 50mg  qd and HCTZ 25 mg qd but has been out of these medications since February.   - restart Losartan 50 mg - switch to lasix 40 mg qd due to lower extremity edema  - BMP today - return in three weeks for repeat BMP and BP check

## 2018-04-13 NOTE — Progress Notes (Addendum)
   CC: Establish Care, right leg pain and swelling  HPI:  [name] is a 57 y.o. with PMH as below. She has been out of her Losartan and HCTZ since February but states her HCTZ did not help her leg swelling when she was taking it. She currently has been having leg swelling after working all day. She ocassionally uses compression stockings. She states she has also had 9lb weight gain int he last month and around 30lbs in the last 9 months. She has had increased SOB on exertion and is able to lie flat when she sleeps. She has a history of OSA for which she used to use CPAP, but this has been in storage and is broken. She states her roommate sometimes notices that she will be snoring and then will suddenly stop snoring.   Please see A&P for assessment of the patient's chronic medical conditions.    Past Medical History:  Diagnosis Date  . Chronic pain   . Duodenal ulcer Jan. 2012   Upper GI bleeding 2nd to NSAID use  . GERD (gastroesophageal reflux disease)   . Hypertension   . Iron deficiency anemia   . OSA (obstructive sleep apnea)   . Plantar fascia syndrome   . Rhinitis   . Varicose vein of leg   Allergies: Ace inhibitors (cough), Penicillins (swelling, rash)  Social History:  She works at [employer] and uses the bus for transportation. She quit smoking 2.5 years ago and denies alcohol use.  She currently is living with a friend as she is unable to obtain an apartment due to a previous apartment manager who said she was evicted when she wasn't. She is trying to get her own apartment.   Family History: Hypothyroidism: mother, sisters, most of the women in her family   Hyperthyroidism: mother   Review of Systems:   Review of Systems  Constitutional: Positive for malaise/fatigue. Negative for weight loss.  Eyes: Negative for blurred vision, photophobia, pain and redness.  Respiratory: Positive for shortness of breath and wheezing. Negative for cough and hemoptysis.     Cardiovascular: Positive for leg swelling. Negative for chest pain, palpitations and orthopnea.  Gastrointestinal: Negative for abdominal pain, diarrhea, heartburn and nausea.  Genitourinary: Positive for frequency. Negative for dysuria and urgency.  Musculoskeletal: Positive for joint pain and myalgias. Negative for back pain.  Neurological: Positive for sensory change. Negative for dizziness, tingling, focal weakness, loss of consciousness and weakness.  Endo/Heme/Allergies: Negative for environmental allergies and polydipsia. Does not bruise/bleed easily.  Psychiatric/Behavioral: Positive for depression. The patient is not nervous/anxious and does not have insomnia.      Physical Exam:  Constitution: NAD, obese HENT: [place], AT Eyes: no scleral icterus, EOM intact Cardio: regular rate & rhythm, no m/r/g Respiratory: clear to auscultation, no wheezing, rales, rhonchi Abdominal: soft, NTTP, non-distended, +BS MSK: +2 pitting edema bilaterally, TTP LE bilaterally and right lateral thigh Neuro: A&Ox3, pleasant, cooperative, normal affect Skin: clean, dry, intact    Vitals:   04/13/18 1316  BP: (!) 146/74  Pulse: 99  Temp: 98.8 F (37.1 C)  TempSrc: Oral  SpO2: 97%  Weight: (!) 335 lb 9.6 oz (152.2 kg)  Height: 5\' 5"  (1.651 m)    Assessment & Plan:   See Encounters Tab for problem based charting.  Patient seen with Dr. Oswaldo Done

## 2018-04-13 NOTE — Assessment & Plan Note (Signed)
She has not used her CPAP in over a year as it has been in storage and has been broken. She states her roommate has heard her snoring and then suddenly stop, sounding as if she is stopping breathing. We discussed the importance of using her CPAP at home and getting it back in working order.

## 2018-04-13 NOTE — Patient Instructions (Addendum)
Thank you for coming to the clinic today. It was a pleasure to see you.   For your Hypertension  Please take:   Losartan 50 mg tablet once per day  Lasix 40 mg tablet once per day   Please contact your CPAP manufacturer to try and begin using this at night again.    Sleep Apnea Sleep apnea is a condition that affects breathing. People with sleep apnea have moments during sleep when their breathing pauses briefly or gets shallow. Sleep apnea can cause these symptoms:  Trouble staying asleep.  Sleepiness or tiredness during the day.  Irritability.  Loud snoring.  Morning headaches.  Trouble concentrating.  Forgetting things.  Less interest in sex.  Being sleepy for no reason.  Mood swings.  Personality changes.  Depression.  Waking up a lot during the night to pee (urinate).  Dry mouth.  Sore throat.  Follow these instructions at home:  Make any changes in your routine that your doctor recommends.  Eat a healthy, well-balanced diet.    Take over-the-counter and prescription medicines only as told by your doctor.  Avoid using alcohol, calming medicines (sedatives), and narcotic medicines.  Take steps to lose weight if you are overweight.  If you were given a machine (device) to use while you sleep, use it only as told by your doctor.  Do not use any tobacco products, such as cigarettes, chewing tobacco, and e-cigarettes. If you need help quitting, ask your doctor.  Keep all follow-up visits as told by your doctor. This is important. Contact a doctor if:  The machine that you were given to use during sleep is uncomfortable or does not seem to be working.  Your symptoms do not get better.  Your symptoms get worse. Get help right away if:  Your chest hurts.  You have trouble breathing in enough air (shortness of breath).  You have an uncomfortable feeling in your back, arms, or stomach.  You have trouble talking.  One side of your body feels  weak.  A part of your face is hanging down (drooping). These symptoms may be an emergency. Do not wait to see if the symptoms will go away. Get medical help right away. Call your local emergency services (911 in the [place] Do not drive yourself to the hospital. This information is not intended to replace advice given to you by your health care provider. Make sure you discuss any questions you have with your health care provider. Document Released: 04/07/2008 Document Revised: 02/23/2016 Document Reviewed: 04/08/2015 Elsevier Interactive Patient Education  2018 [employer]   Heart Failure  Heart failure is a condition in which the heart has trouble pumping blood because it has become weak or stiff. This means that the heart does not pump blood efficiently for the body to work well. For some people with heart failure, fluid may back up into the lungs and there may be swelling (edema) in the lower legs. Heart failure is usually a long-term (chronic) condition. It is important for you to take good care of yourself and follow the treatment plan from your health care provider. What are the causes? This condition is caused by some health problems, including:  High blood pressure (hypertension). Hypertension causes the heart muscle to work harder than normal. High blood pressure eventually causes the heart to become stiff and weak.  Coronary artery disease (CAD). CAD is the buildup of cholesterol and fat (plaques) in the arteries of the heart.  Heart attack (myocardial infarction). Injured  tissue, which is caused by the heart attack, does not contract as well and the heart's ability to pump blood is weakened.  Abnormal heart valves. When the heart valves do not open and close properly, the heart muscle must pump harder to keep the blood flowing.  Heart muscle disease (cardiomyopathy or myocarditis). Heart muscle disease is damage to the heart muscle from a variety of causes, such as drug or  alcohol abuse, infections, or unknown causes. These can increase the risk of heart failure.  Lung disease. When the lungs do not work properly, the heart must work harder.  What increases the risk? Risk of heart failure increases as a person ages. This condition is also more likely to develop in people who:  Are overweight.  Are female.  Smoke or chew tobacco.  Abuse alcohol or illegal drugs.  Have taken medicines that can damage the heart, such as chemotherapy drugs.  Have diabetes. ? High blood sugar (glucose) is associated with high fat (lipid) levels in the blood. ? Diabetes can also damage tiny blood vessels that carry nutrients to the heart muscle.  Have abnormal heart rhythms.  Have thyroid problems.  Have low blood counts (anemia).  What are the signs or symptoms? Symptoms of this condition include:  Shortness of breath with activity, such as when climbing stairs.  Persistent cough.  Swelling of the feet, ankles, legs, or abdomen.  Unexplained weight gain.  Difficulty breathing when lying flat (orthopnea).  Waking from sleep because of the need to sit up and get more air.  Rapid heartbeat.  Fatigue and loss of energy.  Feeling light-headed, dizzy, or close to fainting.  Loss of appetite.  Nausea.  Increased urination during the night (nocturia).  Confusion.  How is this diagnosed? This condition is diagnosed based on:  Medical history, symptoms, and a physical exam.  Diagnostic tests, which may include: ? Echocardiogram. ? Electrocardiogram (ECG). ? Chest X-ray. ? Blood tests. ? Exercise stress test. ? Radionuclide scans. ? Cardiac catheterization and angiogram.  How is this treated? Treatment for this condition is aimed at managing the symptoms of heart failure. Medicines, behavioral changes, or other treatments may be necessary to treat heart failure. Medicines These may include:  Angiotensin-converting enzyme (ACE) inhibitors.  This type of medicine blocks the effects of a blood protein called angiotensin-converting enzyme. ACE inhibitors relax (dilate) the blood vessels and help to lower blood pressure.  Angiotensin receptor blockers (ARBs). This type of medicine blocks the actions of a blood protein called angiotensin. ARBs dilate the blood vessels and help to lower blood pressure.  Water pills (diuretics). Diuretics cause the kidneys to remove salt and water from the blood. The extra fluid is removed through urination, leaving a lower volume of blood that the heart has to pump.  Beta blockers. These improve heart muscle strength and they prevent the heart from beating too quickly.  Digoxin. This increases the force of the heartbeat.  Healthy behavior changes These may include:  Reaching and maintaining a healthy weight.  Stopping smoking or chewing tobacco.  Eating heart-healthy foods.  Limiting or avoiding alcohol.  Stopping use of street drugs (illegal drugs).  Physical activity.  Other treatments These may include:  Surgery to open blocked coronary arteries or repair damaged heart valves.  Placement of a biventricular pacemaker to improve heart muscle function (cardiac resynchronization therapy). This device paces both the right ventricle and left ventricle.  Placement of a device to treat serious abnormal heart rhythms (implantable cardioverter defibrillator,  or ICD).  Placement of a device to improve the pumping ability of the heart (left ventricular assist device, or LVAD).  Heart transplant. This can cure heart failure, and it is considered for certain patients who do not improve with other therapies.  Follow these instructions at home: Medicines  Take over-the-counter and prescription medicines only as told by your health care provider. Medicines are important in reducing the workload of your heart, slowing the progression of heart failure, and improving your symptoms. ? Do not stop  taking your medicine unless your health care provider told you to do that. ? Do not skip any dose of medicine. ? Refill your prescriptions before you run out of medicine. You need your medicines every day. Eating and drinking    Eat heart-healthy foods. Talk with a dietitian to make an eating plan that is right for you. ? Choose foods that contain no trans fat and are low in saturated fat and cholesterol. Healthy choices include fresh or frozen fruits and vegetables, fish, lean meats, legumes, fat-free or low-fat dairy products, and whole-grain or high-fiber foods. ? Limit salt (sodium) if directed by your health care provider. Sodium restriction may reduce symptoms of heart failure. Ask a dietitian to recommend heart-healthy seasonings. ? Use healthy cooking methods instead of frying. Healthy methods include roasting, grilling, broiling, baking, poaching, steaming, and stir-frying.  Limit your fluid intake if directed by your health care provider. Fluid restriction may reduce symptoms of heart failure. Lifestyle  Stop smoking or using chewing tobacco. Nicotine and tobacco can damage your heart and your blood vessels. Do not use nicotine gum or patches before talking to your health care provider.  Limit alcohol intake to no more than 1 drink per day for non-pregnant women and 2 drinks per day for men. One drink equals 12 oz of beer, 5 oz of wine, or 1 oz of hard liquor. ? Drinking more than that is harmful to your heart. Tell your health care provider if you drink alcohol several times a week. ? Talk with your health care provider about whether any level of alcohol use is safe for you. ? If your heart has already been damaged by alcohol or you have severe heart failure, drinking alcohol should be stopped completely.  Stop use of illegal drugs.  Lose weight if directed by your health care provider. Weight loss may reduce symptoms of heart failure.  Do moderate physical activity if directed  by your health care provider. People who are elderly and people with severe heart failure should consult with a health care provider for physical activity recommendations. Monitor important information  Weigh yourself every day. Keeping track of your weight daily helps you to notice excess fluid sooner. ? Weigh yourself every morning after you urinate and before you eat breakfast. ? Wear the same amount of clothing each time you weigh yourself. ? Record your daily weight. Provide your health care provider with your weight record.  Monitor and record your blood pressure as told by your health care provider.  Check your pulse as told by your health care provider. Dealing with extreme temperatures  If the weather is extremely hot: ? Avoid vigorous physical activity. ? Use air conditioning or fans or seek a cooler location. ? Avoid caffeine and alcohol. ? Wear loose-fitting, lightweight, and light-colored clothing.  If the weather is extremely cold: ? Avoid vigorous physical activity. ? Layer your clothes. ? Wear mittens or gloves, a hat, and a scarf when you go outside. ?  Avoid alcohol. General instructions  Manage other health conditions such as hypertension, diabetes, thyroid disease, or abnormal heart rhythms as told by your health care provider.  Learn to manage stress. If you need help to do this, ask your health care provider.  Plan rest periods when fatigued.  Get ongoing education and support as needed.  Participate in or seek rehabilitation as needed to maintain or improve independence and quality of life.  Stay up to date with immunizations. Keeping current on pneumococcal and influenza immunizations is especially important to prevent respiratory infections.  Keep all follow-up visits as told by your health care provider. This is important. Contact a health care provider if:  You have a rapid weight gain.  You have increasing shortness of breath that is unusual for  you.  You are unable to participate in your usual physical activities.  You tire easily.  You cough more than normal, especially with physical activity.  You have any swelling or more swelling in areas such as your hands, feet, ankles, or abdomen.  You are unable to sleep because it is hard to breathe.  You feel like your heart is beating quickly (palpitations).  You become dizzy or light-headed when you stand up. Get help right away if:  You have difficulty breathing.  You notice or your family notices a change in your awareness, such as having trouble staying awake or having difficulty with concentration.  You have pain or discomfort in your chest.  You have an episode of fainting (syncope). This information is not intended to replace advice given to you by your health care provider. Make sure you discuss any questions you have with your health care provider. Document Released: 06/29/2005 Document Revised: 03/03/2016 Document Reviewed: 01/22/2016 Elsevier Interactive Patient Education  [employer].

## 2018-04-13 NOTE — Assessment & Plan Note (Signed)
She has had 9lb weight gain in the last month and 30lbs in the past 9 months, increasing dyspnea on exertion and leg swelling. She states she is able to lie flat at night. She states she quit smoking 2 years ago. Last ECHO in 2012 showed EF 55-60% and no diastolic dysfunction. She has not had her HCTZ 25 mg since February but states it was not helping her swelling.    - refer for ECHO - switch HCTZ to lasix 40 mg qd - BMP - return in 3 weeks for repeat BMP

## 2018-04-14 ENCOUNTER — Encounter: Payer: Self-pay | Admitting: Internal Medicine

## 2018-04-14 LAB — BMP8+ANION GAP
Anion Gap: 13 mmol/L (ref 10.0–18.0)
BUN / CREAT RATIO: 12 (ref 9–23)
BUN: 9 mg/dL (ref 6–24)
CHLORIDE: 101 mmol/L (ref 96–106)
CO2: 27 mmol/L (ref 20–29)
Calcium: 9 mg/dL (ref 8.7–10.2)
Creatinine, Ser: 0.74 mg/dL (ref 0.57–1.00)
GFR calc Af Amer: 104 mL/min/{1.73_m2} (ref >59)
GFR calc non Af Amer: 90 mL/min/{1.73_m2} (ref >59)
GLUCOSE: 101 mg/dL — AB (ref 65–99)
POTASSIUM: 4.3 mmol/L (ref 3.5–5.2)
Sodium: 141 mmol/L (ref 134–144)

## 2018-04-14 LAB — TSH: TSH: 3.02 u[IU]/mL (ref 0.450–4.500)

## 2018-04-14 NOTE — Addendum Note (Signed)
Addended by: Guinevere Scarlet A on: 04/14/2018 05:04 PM   Modules accepted: Level of Service

## 2018-04-15 NOTE — Progress Notes (Signed)
Internal Medicine Clinic Attending  I saw and evaluated the patient.  I personally confirmed the key portions of the history and exam documented by Dr. Seawell and I reviewed pertinent patient test results.  The assessment, diagnosis, and plan were formulated together and I agree with the documentation in the resident's note.     

## 2018-04-22 ENCOUNTER — Encounter: Payer: Self-pay | Admitting: Internal Medicine

## 2018-04-22 NOTE — Telephone Encounter (Signed)
I spoke with Claudia Brady by phone and told her about normal bmp and tsh result. She is doing well on lasix, edema a little improved. Has echo scheduled for 10/15, then planning on seeing [place] again in clinic on 10/23. We can go over results of echo then.

## 2018-04-26 ENCOUNTER — Ambulatory Visit (HOSPITAL_COMMUNITY): Admission: RE | Admit: 2018-04-26 | Payer: Medicaid Other | Source: Ambulatory Visit

## 2018-04-29 ENCOUNTER — Ambulatory Visit (HOSPITAL_COMMUNITY): Admission: RE | Admit: 2018-04-29 | Payer: Medicaid Other | Source: Ambulatory Visit

## 2018-05-04 ENCOUNTER — Ambulatory Visit (INDEPENDENT_AMBULATORY_CARE_PROVIDER_SITE_OTHER): Payer: Medicaid Other | Admitting: Internal Medicine

## 2018-05-04 ENCOUNTER — Encounter: Payer: Self-pay | Admitting: Internal Medicine

## 2018-05-04 VITALS — BP 175/110 | HR 78 | Temp 98.0°F | Wt 336.3 lb

## 2018-05-04 DIAGNOSIS — Z79899 Other long term (current) drug therapy: Secondary | ICD-10-CM | POA: Diagnosis not present

## 2018-05-04 DIAGNOSIS — I509 Heart failure, unspecified: Secondary | ICD-10-CM

## 2018-05-04 DIAGNOSIS — I10 Essential (primary) hypertension: Secondary | ICD-10-CM

## 2018-05-04 DIAGNOSIS — G4733 Obstructive sleep apnea (adult) (pediatric): Secondary | ICD-10-CM | POA: Diagnosis not present

## 2018-05-04 DIAGNOSIS — I110 Hypertensive heart disease with heart failure: Secondary | ICD-10-CM | POA: Diagnosis not present

## 2018-05-04 MED ORDER — AMLODIPINE BESYLATE 5 MG PO TABS: 5.0000 mg | ORAL_TABLET | Freq: Every day | ORAL | 2 refills | Status: DC

## 2018-05-04 NOTE — Patient Instructions (Signed)
Thank you for allowing [place] to provide your care today. Today we discussed hypertension and chronic heart failure.    I have ordered basic metabolic panel labs for you. I will call if any are abnormal.    Today we made the following changes to your medications:  Please START taking Norvasc (amlodipine) 5mg  tablet once per day.    Please follow-up in two weeks for blood pressure check and after having your echocardiogram done.    Should you have any questions or concerns please call the internal medicine clinic at [phone].

## 2018-05-04 NOTE — Progress Notes (Signed)
   CC: f/u bp check   HPI:  [name] is a 57 y.o. with PMH as below presenting for follow-up BP check and new medication for chronic heart failure.    Please see A&P for assessment of the patient's chronic medical conditions.   Past Medical History:  Diagnosis Date  . Chronic pain   . Duodenal ulcer Jan. 2012   Upper GI bleeding 2nd to NSAID use  . GERD (gastroesophageal reflux disease)   . Hypertension   . Iron deficiency anemia   . OSA (obstructive sleep apnea)   . Plantar fascia syndrome   . Rhinitis   . Varicose vein of leg    Review of Systems:   Review of Systems  Constitutional: Negative for diaphoresis and malaise/fatigue.  Respiratory: Negative for cough and wheezing. Shortness of breath: occasional SOB.   Cardiovascular: Positive for leg swelling. Negative for chest pain and palpitations.  Gastrointestinal: Negative for abdominal pain and nausea.   Physical Exam:  Constitution: NAD, obese Cardio: RRR, no m/r/g, JVD Respiratory: CTAB, no wheezing or crackles MSK: +1 pitting edema Skin: c/d/i    Vitals:   05/04/18 1310  BP: (!) 175/110  Pulse: 78  Temp: 98 F (36.7 C)  TempSrc: Oral  SpO2: 98%  Weight: (!) 336 lb 4.8 oz (152.5 kg)     Assessment & Plan:   See Encounters Tab for problem based charting.  Patient seen with Dr. Rogelia Boga

## 2018-05-05 LAB — BMP8+ANION GAP
Anion Gap: 11 mmol/L (ref 10.0–18.0)
BUN / CREAT RATIO: 17 (ref 9–23)
BUN: 14 mg/dL (ref 6–24)
CO2: 28 mmol/L (ref 20–29)
Calcium: 9 mg/dL (ref 8.7–10.2)
Chloride: 101 mmol/L (ref 96–106)
Creatinine, Ser: 0.82 mg/dL (ref 0.57–1.00)
GFR calc Af Amer: 92 mL/min/{1.73_m2} (ref >59)
GFR, EST NON AFRICAN AMERICAN: 80 mL/min/{1.73_m2} (ref >59)
Glucose: 111 mg/dL — ABNORMAL HIGH (ref 65–99)
POTASSIUM: 4 mmol/L (ref 3.5–5.2)
SODIUM: 140 mmol/L (ref 134–144)

## 2018-05-05 NOTE — Assessment & Plan Note (Signed)
BP Readings from Last 3 Encounters:  05/04/18 (!) 175/110  04/13/18 (!) 146/74  01/17/18 (!) 177/93   She has been taking her lasix 40 mg qd and losartan 50 mg qd with BP still elevated. Rechecked bp 190/92.   - start Norvasc 5 mg qd - cont. Lasix, losartan - f/u two weeks

## 2018-05-05 NOTE — Assessment & Plan Note (Signed)
She is doing well on lasix 40 mg qd although is having trouble taking it everyday because she gets in trouble at work for too many bathroom breaks. She has cancelled her ECHO twice. We have arranged an appt today and discussed that she needs to get this done. She states she sometimes has SOB when walking and feels like the swelling in her legs has decreased.   - cont. Lasix 40 mg qd - note for work citing necessity of bathroom breaks - ECHO scheduled  - f/u two weeks

## 2018-05-05 NOTE — Assessment & Plan Note (Signed)
She still has been unable to obtain her CPAP from storage. She states she will be moving into an apartment soon and will be able to get it then.

## 2018-05-18 ENCOUNTER — Ambulatory Visit: Payer: Self-pay

## 2018-05-20 ENCOUNTER — Ambulatory Visit (HOSPITAL_COMMUNITY)
Admission: RE | Admit: 2018-05-20 | Discharge: 2018-05-20 | Disposition: A | Discharge status: Home / Self Care | Payer: Medicaid Other | Source: Ambulatory Visit | Attending: Oncology | Admitting: Oncology

## 2018-05-20 DIAGNOSIS — Z6841 Body Mass Index (BMI) 40.0 and over, adult: Secondary | ICD-10-CM | POA: Diagnosis not present

## 2018-05-20 DIAGNOSIS — I4891 Unspecified atrial fibrillation: Secondary | ICD-10-CM | POA: Insufficient documentation

## 2018-05-20 DIAGNOSIS — I110 Hypertensive heart disease with heart failure: Secondary | ICD-10-CM | POA: Diagnosis not present

## 2018-05-20 DIAGNOSIS — I509 Heart failure, unspecified: Secondary | ICD-10-CM | POA: Diagnosis present

## 2018-05-20 NOTE — Progress Notes (Signed)
Echocardiogram 2D Echocardiogram has been performed.  Claudia Brady 05/20/2018, 3:16 PM

## 2018-05-23 ENCOUNTER — Other Ambulatory Visit: Payer: Self-pay

## 2018-05-23 ENCOUNTER — Ambulatory Visit: Payer: Medicaid Other | Admitting: Internal Medicine

## 2018-05-23 ENCOUNTER — Encounter: Payer: Self-pay | Admitting: Internal Medicine

## 2018-05-23 DIAGNOSIS — Z79899 Other long term (current) drug therapy: Secondary | ICD-10-CM | POA: Diagnosis not present

## 2018-05-23 DIAGNOSIS — I10 Essential (primary) hypertension: Secondary | ICD-10-CM | POA: Diagnosis not present

## 2018-05-23 NOTE — Patient Instructions (Signed)
Thank you for coming to the clinic today. It was a pleasure to see you.   For your hypertension, please continue to take the norvasc 5 mg, lasix 40 mg, and losartan 50 mg daily   FOLLOW-UP INSTRUCTIONS When: 3-6 months with Dr. Cleaster Corin  For: checkup of your general health  What to bring: all of your medication bottles   Please call the internal medicine center clinic if you have any questions or concerns, we may be able to help and keep you from a long and expensive emergency room wait. Our clinic and after hours phone number is [phone], the best time to call is Monday through Friday 9 am to 4 pm but there is always someone available 24/7 if you have an emergency. If you need medication refills please notify your pharmacy one week in advance and they will send [place] a request.

## 2018-05-23 NOTE — Progress Notes (Signed)
   CC: follow up of hypertension   HPI:  [name] is a 57 y.o. with PMH as listed below who presents for follow up of hypertension. Please see the assessment and plans for the status of the patient chronic medical problems.   Past Medical History:  Diagnosis Date  . Chronic pain   . Duodenal ulcer Jan. 2012   Upper GI bleeding 2nd to NSAID use  . GERD (gastroesophageal reflux disease)   . Hypertension   . Iron deficiency anemia   . OSA (obstructive sleep apnea)   . Plantar fascia syndrome   . Rhinitis   . Varicose vein of leg    Review of Systems: Refer to history of present illness and assessment and plans for pertinent review of systems, all others reviewed and negative  Physical Exam:  Vitals:   05/23/18 1316  BP: 133/76  Pulse: 75  Temp: 97.6 F (36.4 C)  TempSrc: Oral  SpO2: 100%  Weight: (!) 334 lb (151.5 kg)  Height: 5\' 5"  (1.651 m)   General: Well appearing, no acute distress  Cardiac: Regular rate and rhythm, no murmur, no peripheral edema  Pulm: normal work of breathing, lungs clear to auscultation    Assessment & Plan:   Hypertension  Blood pressure well controlled today, significantly improved from the time of last office visit when she was started on norvasc 5 mg daily in addition to her already prescribed lasix 40 mg daily and losartan 50 mg daily. Since beginning the new medication she has noticed that her eyes become red and burn sometimes and at times and that she feels fatigued after taking the medication. Last BMP was a few weeks ago, renal function and electrolytes were within the normal range. We discussed lifestyle modifications including salt restriction and weight loss.  - continue current antihypertensive combination, we discussed that she should continue to take the lasix during the day to limit the number of times that she needs to wake up to urinate but that the amlodipine and losartan can be taken in the evening in an attempt to limit  the side effects that she is feeling   See Encounters Tab for problem based charting.  Patient discussed with Dr. Criselda Peaches

## 2018-05-23 NOTE — Assessment & Plan Note (Signed)
Blood pressure well controlled today, significantly improved from the time of last office visit when she was started on norvasc 5 mg daily in addition to her already prescribed lasix 40 mg daily and losartan 50 mg daily. Since beginning the new medication she has noticed that her eyes become red and burn sometimes and at times and that she feels fatigued after taking the medication. Last BMP was a few weeks ago, renal function and electrolytes were within the normal range.  - continue current antihypertensive combination, we discussed that she should continue to take the lasix during the day to limit the number of times that she needs to wake up to urinate but that the amlodipine and losartan can be taken in the evening in an attempt to limit the side effects that she is feeling

## 2018-05-26 NOTE — Progress Notes (Signed)
Internal Medicine Clinic Attending  Case discussed with Dr. Blum at the time of the visit.  We reviewed the resident's history and exam and pertinent patient test results.  I agree with the assessment, diagnosis, and plan of care documented in the resident's note. 

## 2018-05-29 ENCOUNTER — Other Ambulatory Visit: Payer: Self-pay | Admitting: Infectious Diseases

## 2018-05-29 DIAGNOSIS — B20 Human immunodeficiency virus [HIV] disease: Secondary | ICD-10-CM

## 2018-07-18 ENCOUNTER — Other Ambulatory Visit (HOSPITAL_COMMUNITY)
Admission: RE | Admit: 2018-07-18 | Discharge: 2018-07-18 | Disposition: A | Discharge status: Home / Self Care | Payer: Medicaid Other | Source: Ambulatory Visit | Attending: Internal Medicine | Admitting: Internal Medicine

## 2018-07-18 ENCOUNTER — Other Ambulatory Visit: Payer: No Typology Code available for payment source

## 2018-07-18 ENCOUNTER — Other Ambulatory Visit: Payer: Medicaid Other

## 2018-07-18 DIAGNOSIS — Z79899 Other long term (current) drug therapy: Secondary | ICD-10-CM

## 2018-07-18 DIAGNOSIS — B20 Human immunodeficiency virus [HIV] disease: Secondary | ICD-10-CM

## 2018-07-18 DIAGNOSIS — Z113 Encounter for screening for infections with a predominantly sexual mode of transmission: Secondary | ICD-10-CM | POA: Insufficient documentation

## 2018-07-19 LAB — LIPID PANEL
CHOLESTEROL: 170 mg/dL (ref <200)
HDL: 65 mg/dL (ref >50)
LDL CHOLESTEROL (CALC): 86 mg/dL
NON-HDL CHOLESTEROL (CALC): 105 mg/dL (ref <130)
Total CHOL/HDL Ratio: 2.6 (calc) (ref <5.0)
Triglycerides: 99 mg/dL (ref <150)

## 2018-07-19 LAB — CBC
HCT: 38.8 % (ref 35.0–45.0)
HEMOGLOBIN: 12.5 g/dL (ref 11.7–15.5)
MCH: 27 pg (ref 27.0–33.0)
MCHC: 32.2 g/dL (ref 32.0–36.0)
MCV: 83.8 fL (ref 80.0–100.0)
MPV: 11.2 fL (ref 7.5–12.5)
PLATELETS: 319 10*3/uL (ref 140–400)
RBC: 4.63 10*6/uL (ref 3.80–5.10)
RDW: 14.8 % (ref 11.0–15.0)
WBC: 4.5 10*3/uL (ref 3.8–10.8)

## 2018-07-19 LAB — COMPREHENSIVE METABOLIC PANEL
AG RATIO: 1.2 (calc) (ref 1.0–2.5)
ALT: 10 U/L (ref 6–29)
AST: 19 U/L (ref 10–35)
Albumin: 4.2 g/dL (ref 3.6–5.1)
Alkaline phosphatase (APISO): 100 U/L (ref 33–130)
BUN: 10 mg/dL (ref 7–25)
CO2: 32 mmol/L (ref 20–32)
CREATININE: 0.91 mg/dL (ref 0.50–1.05)
Calcium: 9.4 mg/dL (ref 8.6–10.4)
Chloride: 103 mmol/L (ref 98–110)
GLUCOSE: 83 mg/dL (ref 65–99)
Globulin: 3.6 g/dL (calc) (ref 1.9–3.7)
Potassium: 4.4 mmol/L (ref 3.5–5.3)
SODIUM: 141 mmol/L (ref 135–146)
TOTAL PROTEIN: 7.8 g/dL (ref 6.1–8.1)
Total Bilirubin: 0.6 mg/dL (ref 0.2–1.2)

## 2018-07-19 LAB — T-HELPER CELL (CD4) - (RCID CLINIC ONLY)
CD4 % Helper T Cell: 41 % (ref 33–55)
CD4 T CELL ABS: 730 /uL (ref 400–2700)

## 2018-07-19 LAB — URINE CYTOLOGY ANCILLARY ONLY
CHLAMYDIA, DNA PROBE: NEGATIVE
NEISSERIA GONORRHEA: NEGATIVE

## 2018-07-19 LAB — RPR: RPR Ser Ql: NONREACTIVE

## 2018-08-01 ENCOUNTER — Encounter: Payer: Self-pay | Admitting: Internal Medicine

## 2018-08-01 ENCOUNTER — Encounter: Payer: No Typology Code available for payment source | Admitting: Infectious Diseases

## 2018-08-01 ENCOUNTER — Ambulatory Visit (INDEPENDENT_AMBULATORY_CARE_PROVIDER_SITE_OTHER): Payer: Medicaid Other | Admitting: Internal Medicine

## 2018-08-01 VITALS — BP 149/92 | HR 73 | Temp 97.6°F | Wt 334.0 lb

## 2018-08-01 DIAGNOSIS — I10 Essential (primary) hypertension: Secondary | ICD-10-CM | POA: Diagnosis not present

## 2018-08-01 DIAGNOSIS — R600 Localized edema: Secondary | ICD-10-CM | POA: Diagnosis not present

## 2018-08-01 DIAGNOSIS — B20 Human immunodeficiency virus [HIV] disease: Secondary | ICD-10-CM | POA: Diagnosis not present

## 2018-08-01 DIAGNOSIS — R635 Abnormal weight gain: Secondary | ICD-10-CM | POA: Diagnosis not present

## 2018-08-01 DIAGNOSIS — Z23 Encounter for immunization: Secondary | ICD-10-CM | POA: Diagnosis not present

## 2018-08-01 DIAGNOSIS — Z0000 Encounter for general adult medical examination without abnormal findings: Secondary | ICD-10-CM | POA: Diagnosis not present

## 2018-08-01 MED ORDER — AMLODIPINE BESYLATE 10 MG PO TABS: 10.0000 mg | ORAL_TABLET | Freq: Every day | ORAL | 11 refills | Status: DC

## 2018-08-01 NOTE — Progress Notes (Signed)
RFV: follow up for hiv disease, establishing with new provider  Patient ID: [name], female   DOB: 1960-10-15, 58 y.o.   MRN: [id number]  HPI Claudia Brady is a 58yo F with hiv disease, CD 4 count of 730/VL 27 (jan 2020) on biktarvy.   3 days of the week she takes it normally. But during hte week difficulty because of her work hours are irregularly and takes her medications at different times. She denies any recent illnesses.  ROS: notices she has some lower extremity swelling at the end of the work day  Outpatient Encounter Medications as of 08/01/2018  Medication Sig  . amLODipine (NORVASC) 5 MG tablet Take 1 tablet (5 mg total) by mouth daily.  . bictegravir-emtricitabine-tenofovir AF (BIKTARVY) 50-200-25 MG TABS tablet Take 1 tablet by mouth daily.  Marland Kitchen. BIKTARVY 50-200-25 MG TABS tablet TAKE 1 TABLET BY MOUTH DAILY  . furosemide (LASIX) 40 MG tablet Take 1 tablet (40 mg total) by mouth daily.  Marland Kitchen. ibuprofen (ADVIL,MOTRIN) 800 MG tablet Take 1 tablet (800 mg total) by mouth 2 (two) times daily.  Marland Kitchen. losartan (COZAAR) 50 MG tablet Take 1 tablet (50 mg total) by mouth daily.  . Multiple Vitamin (MULTIVITAMIN WITH MINERALS) TABS tablet Take 1 tablet by mouth daily.  Marland Kitchen. BIOTIN PO Take 1 capsule by mouth daily.   No facility-administered encounter medications on file as of 08/01/2018.      Patient Active Problem List   Diagnosis Date Noted  . Chronic heart failure (HCC) 04/13/2018  . Abnormal uterine bleeding (AUB) 02/04/2018  . Morbid obesity with BMI of 45.0-49.9, adult (HCC) 04/19/2017  . 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 HTN (hypertension) 10/23/2016  . Human immunodeficiency virus I infection (HCC) 10/02/2016  . Poor dentition 10/02/2016  . Depression 11/28/2010  . Disorder of thyroid 08/20/2010  . ANEMIA, IRON DEFICIENCY 08/20/2010  . Obstructive sleep apnea 08/20/2010  . RESTLESS LEG SYNDROME 08/20/2010  . ATRIAL FIBRILLATION 08/20/2010  . ALLERGIC RHINITIS 08/20/2010  . ASTHMA  08/20/2010  . G E R D 08/20/2010     Health Maintenance Due  Topic Date Due  . TETANUS/TDAP  02/22/1980     Review of Systems  Physical Exam   BP (!) 149/92   Pulse 73   Temp 97.6 F (36.4 C) (Oral)   Wt (!) 334 lb (151.5 kg)   BMI 55.58 kg/m    Lab Results  Component Value Date   CD4TCELL 41 07/18/2018   Lab Results  Component Value Date   CD4TABS 730 07/18/2018   CD4TABS 730 01/03/2018   CD4TABS 680 04/05/2017   Lab Results  Component Value Date   HIV1RNAQUANT 27 (H) 01/03/2018   Lab Results  Component Value Date   HEPBSAB NEG 10/02/2016   Lab Results  Component Value Date   LABRPR NON-REACTIVE 07/18/2018    CBC Lab Results  Component Value Date   WBC 4.5 07/18/2018   RBC 4.63 07/18/2018   HGB 12.5 07/18/2018   HCT 38.8 07/18/2018   PLT 319 07/18/2018   MCV 83.8 07/18/2018   MCH 27.0 07/18/2018   MCHC 32.2 07/18/2018   RDW 14.8 07/18/2018   LYMPHSABS 0.8 03/17/2014   MONOABS 0.5 03/17/2014   EOSABS 0.0 03/17/2014    BMET Lab Results  Component Value Date   NA 141 07/18/2018   K 4.4 07/18/2018   CL 103 07/18/2018   CO2 32 07/18/2018   GLUCOSE 83 07/18/2018   BUN 10 07/18/2018   CREATININE 0.91 07/18/2018  CALCIUM 9.4 07/18/2018   GFRNONAA 80 05/04/2018   GFRAA 92 05/04/2018      Assessment and Plan  htn = will increase norvasc to 10mg  (up from 5mg ); continue on cozaar. Will have her follow up with pcp in 3-4 wk  chf = compression stockings too tight. (lighter compression). Will give referral for wholesale in [place]  hiv disease = well controlled, continue on biktarvy  Health maintenance = will get appt for mammogram, and give tdap  Weight = will increase exercise; weight management course? - will check with seawell

## 2018-08-03 ENCOUNTER — Telehealth: Payer: Self-pay

## 2018-08-03 NOTE — Telephone Encounter (Signed)
Per Dr. Drue Second called patient's PCP office to schedule check up appointment. Patient is scheduled for March 4 at 1:45. Left voicemail with appointment information and PCP phone number incase she needs to reschedule. Claudia Brady, [place]

## 2018-08-29 ENCOUNTER — Other Ambulatory Visit: Payer: Self-pay | Admitting: Infectious Diseases

## 2018-08-29 DIAGNOSIS — B20 Human immunodeficiency virus [HIV] disease: Secondary | ICD-10-CM

## 2018-09-14 ENCOUNTER — Other Ambulatory Visit: Payer: Self-pay

## 2018-09-14 ENCOUNTER — Ambulatory Visit (INDEPENDENT_AMBULATORY_CARE_PROVIDER_SITE_OTHER): Payer: Medicaid Other | Admitting: Internal Medicine

## 2018-09-14 ENCOUNTER — Encounter: Payer: Self-pay | Admitting: Internal Medicine

## 2018-09-14 VITALS — BP 139/90 | HR 75 | Temp 97.9°F | Ht 65.0 in | Wt 343.2 lb

## 2018-09-14 DIAGNOSIS — Z6841 Body Mass Index (BMI) 40.0 and over, adult: Secondary | ICD-10-CM | POA: Diagnosis not present

## 2018-09-14 DIAGNOSIS — I10 Essential (primary) hypertension: Secondary | ICD-10-CM

## 2018-09-14 DIAGNOSIS — I110 Hypertensive heart disease with heart failure: Secondary | ICD-10-CM

## 2018-09-14 DIAGNOSIS — Z21 Asymptomatic human immunodeficiency virus [HIV] infection status: Secondary | ICD-10-CM

## 2018-09-14 DIAGNOSIS — I872 Venous insufficiency (chronic) (peripheral): Secondary | ICD-10-CM

## 2018-09-14 DIAGNOSIS — G43909 Migraine, unspecified, not intractable, without status migrainosus: Secondary | ICD-10-CM | POA: Diagnosis not present

## 2018-09-14 DIAGNOSIS — E669 Obesity, unspecified: Secondary | ICD-10-CM

## 2018-09-14 DIAGNOSIS — Z791 Long term (current) use of non-steroidal anti-inflammatories (NSAID): Secondary | ICD-10-CM | POA: Diagnosis not present

## 2018-09-14 DIAGNOSIS — I509 Heart failure, unspecified: Secondary | ICD-10-CM

## 2018-09-14 DIAGNOSIS — Z8719 Personal history of other diseases of the digestive system: Secondary | ICD-10-CM | POA: Diagnosis not present

## 2018-09-14 DIAGNOSIS — Z79899 Other long term (current) drug therapy: Secondary | ICD-10-CM | POA: Diagnosis not present

## 2018-09-14 DIAGNOSIS — K5900 Constipation, unspecified: Secondary | ICD-10-CM | POA: Diagnosis not present

## 2018-09-14 DIAGNOSIS — R1012 Left upper quadrant pain: Secondary | ICD-10-CM

## 2018-09-14 DIAGNOSIS — G43809 Other migraine, not intractable, without status migrainosus: Secondary | ICD-10-CM

## 2018-09-14 MED ORDER — METOPROLOL TARTRATE 25 MG PO TABS: 25.0000 mg | ORAL_TABLET | Freq: Two times a day (BID) | ORAL | 2 refills | Status: DC

## 2018-09-14 NOTE — Patient Instructions (Signed)
Thank you for allowing [place] to provide your care today. Today we discussed left sided pain and migraine headaches   I have ordered the following labs for you:  Complete metabolic panel and complete blood count   I will call if any are abnormal.    Today we made the following changes to your medications:   Please START taking  Metoprolol 25 MG table two times per day to help treat your migraine headaches   Please STOP taking   Any NSAIDs: Ibuprofen, aleve, and other Non-steroidal Anti-inflammatory Drugs  Please follow-up in one week or if symptoms worsen to reevaluate your pain.  I have ordered xrays of your left side and will call if the results are abnormal.    Should you have any questions or concerns please call the internal medicine clinic at [phone].

## 2018-09-14 NOTE — Progress Notes (Signed)
   CC: routine check up  HPI:  [name] is a 58 y.o. with PMH as below.   Please see A&P for assessment of the patient's acute and chronic medical conditions.    Past Medical History:  Diagnosis Date  . ATRIAL FIBRILLATION 08/20/2010   Qualifier: History of  By: Excell Seltzer CMA, Lawson Fiscal    . Chronic pain   . Duodenal ulcer Jan. 2012   Upper GI bleeding 2nd to NSAID use  . GERD (gastroesophageal reflux disease)   . HIV (human immunodeficiency virus infection) (HCC) 2018  . Hypertension   . Iron deficiency anemia   . OSA (obstructive sleep apnea)   . Plantar fascia syndrome   . RESTLESS LEG SYNDROME 08/20/2010       . Rhinitis   . Varicose vein of leg    Review of Systems:  Review of Systems  Constitutional: Negative for chills.  Respiratory: Negative for cough.   Cardiovascular: Negative for chest pain and leg swelling.  Gastrointestinal: Positive for abdominal pain and constipation. Negative for blood in stool, nausea and vomiting.  Genitourinary: Negative for dysuria and urgency.  Musculoskeletal: Positive for myalgias. Negative for falls.  Neurological: Positive for tingling and headaches. Negative for dizziness, focal weakness and weakness.    Physical Exam:  Constitution: NAD, obese HENT: [place]/AT Eyes: no injection or icterus Cardio: RRR, no m/r/g  Respiratory: CTA, no w/r/r  Abdominal: TTP RUQ, no CVA tenderness MSK: TTP left lower chest wall, NTTP back, pain with supine positioning Skin: no rash or lesions or bruising    Vitals:   09/14/18 1413  BP: 139/90  Pulse: 75  Temp: 97.9 F (36.6 C)  TempSrc: Oral  SpO2: 98%  Weight: (!) 343 lb 3.2 oz (155.7 kg)  Height: 5\' 5"  (1.651 m)     Assessment & Plan:   See Encounters Tab for problem based charting.  Patient discussed with Dr. Rogelia Boga

## 2018-09-15 ENCOUNTER — Other Ambulatory Visit: Payer: Self-pay

## 2018-09-15 LAB — CMP14 + ANION GAP
A/G RATIO: 1.2 (ref 1.2–2.2)
ALT: 10 IU/L (ref 0–32)
AST: 20 IU/L (ref 0–40)
Albumin: 4.3 g/dL (ref 3.8–4.9)
Alkaline Phosphatase: 121 IU/L — ABNORMAL HIGH (ref 39–117)
Anion Gap: 15 mmol/L (ref 10.0–18.0)
BUN / CREAT RATIO: 12 (ref 9–23)
BUN: 11 mg/dL (ref 6–24)
Bilirubin Total: 0.4 mg/dL (ref 0.0–1.2)
CHLORIDE: 97 mmol/L (ref 96–106)
CO2: 26 mmol/L (ref 20–29)
Calcium: 9.2 mg/dL (ref 8.7–10.2)
Creatinine, Ser: 0.9 mg/dL (ref 0.57–1.00)
GFR calc Af Amer: 82 mL/min/{1.73_m2} (ref >59)
GFR calc non Af Amer: 71 mL/min/{1.73_m2} (ref >59)
Globulin, Total: 3.5 g/dL (ref 1.5–4.5)
Glucose: 79 mg/dL (ref 65–99)
POTASSIUM: 4.7 mmol/L (ref 3.5–5.2)
Sodium: 138 mmol/L (ref 134–144)
Total Protein: 7.8 g/dL (ref 6.0–8.5)

## 2018-09-15 LAB — CBC
Hematocrit: 36.9 % (ref 34.0–46.6)
Hemoglobin: 12.1 g/dL (ref 11.1–15.9)
MCH: 27.9 pg (ref 26.6–33.0)
MCHC: 32.8 g/dL (ref 31.5–35.7)
MCV: 85 fL (ref 79–97)
PLATELETS: 294 10*3/uL (ref 150–450)
RBC: 4.34 x10E6/uL (ref 3.77–5.28)
RDW: 14.7 % (ref 11.7–15.4)
WBC: 5.5 10*3/uL (ref 3.4–10.8)

## 2018-09-15 LAB — URINALYSIS, ROUTINE W REFLEX MICROSCOPIC
BILIRUBIN UA: NEGATIVE
Glucose, UA: NEGATIVE
Ketones, UA: NEGATIVE
Nitrite, UA: NEGATIVE
Protein, UA: NEGATIVE
RBC, UA: NEGATIVE
Specific Gravity, UA: 1.011 (ref 1.005–1.030)
Urobilinogen, Ur: 1 mg/dL (ref 0.2–1.0)
pH, UA: 7 (ref 5.0–7.5)

## 2018-09-15 LAB — MICROSCOPIC EXAMINATION: Casts: NONE SEEN /lpf

## 2018-09-16 ENCOUNTER — Ambulatory Visit (HOSPITAL_COMMUNITY)
Admission: RE | Admit: 2018-09-16 | Discharge: 2018-09-16 | Disposition: A | Discharge status: Home / Self Care | Payer: Medicaid Other | Source: Ambulatory Visit | Attending: Internal Medicine | Admitting: Internal Medicine

## 2018-09-16 ENCOUNTER — Encounter: Payer: Self-pay | Admitting: Internal Medicine

## 2018-09-16 DIAGNOSIS — I872 Venous insufficiency (chronic) (peripheral): Secondary | ICD-10-CM | POA: Insufficient documentation

## 2018-09-16 DIAGNOSIS — R1012 Left upper quadrant pain: Secondary | ICD-10-CM | POA: Insufficient documentation

## 2018-09-16 HISTORY — DX: Venous insufficiency (chronic) (peripheral): I87.2

## 2018-09-16 NOTE — Assessment & Plan Note (Signed)
Previously referred for ECHO due to swelling in her legs, dyspnea on exertion, orthopnea, and fast weight gain. Echo in November 2019 showed EF of 55-60% and no diastolic dysfunction. Her symptoms of LE swelling may be due to venous insufficiency and obesity.

## 2018-09-17 ENCOUNTER — Encounter: Payer: Self-pay | Admitting: Internal Medicine

## 2018-09-17 DIAGNOSIS — G43909 Migraine, unspecified, not intractable, without status migrainosus: Secondary | ICD-10-CM | POA: Insufficient documentation

## 2018-09-17 DIAGNOSIS — R1012 Left upper quadrant pain: Secondary | ICD-10-CM | POA: Insufficient documentation

## 2018-09-17 HISTORY — DX: Migraine, unspecified, not intractable, without status migrainosus: G43.909

## 2018-09-17 NOTE — Assessment & Plan Note (Signed)
Thought to be secondary to her Biktarvy. She previously was taking Ibuprofen but has not taken much recently and has not had her 800 MG refilled since December. Discussed that this medication is not ideal with her history of GI bleed and places her at risk. Headaches are located anteriorly with photophobia. She will f/u with infectious disease in July.   - start metoprolol 25mg  bid

## 2018-09-17 NOTE — Assessment & Plan Note (Addendum)
Four weeks jabbing pain that hurts with breathing and with laying down on her back or left side. History of right sided pain last July but now on the left. The area is relieved slightly with sitting up. She has had some intermittent diarrhea that she describes as dark. She denies dizziness, endorses numbness in the hands and feet. Area of rib and LUQ and flank are TTP. Symptoms are most likely MSK in origin as tenderness seems localized to the rib dermatome but with her ongoing use of NSAIDs with history of GI bleed in 2012 will obtain labs. She last had a colonoscopy in 2015 when living in [place] which she states had no acute findings. No palpable splenomegaly, but she does have some increased risk with her history of HIV.   - CBC, CMP  - stop Ibuprofen with history of GI bleed  - x-ray ribs and spine - follow-up one week  - UA  ADDENDUM: X-rays and labs normal. She continues to have severe right upper quadrant pain, will schedule follow-up for further evaluation.

## 2018-09-17 NOTE — Assessment & Plan Note (Signed)
BP Readings from Last 3 Encounters:  09/14/18 139/90  08/01/18 (!) 149/92  05/23/18 133/76  Blood pressure well controlled on norvasc 10mg , losartan 50 mg qd, and lasix 40mg  qd.  - cont. Current medications.  - metoprolol added for migraines, assess if bp adjustment necessary at f/u

## 2018-09-21 NOTE — Progress Notes (Signed)
Internal Medicine Clinic Attending  Case discussed with Dr. Seawell at the time of the visit.  We reviewed the resident's history and exam and pertinent patient test results.  I agree with the assessment, diagnosis, and plan of care documented in the resident's note.    

## 2018-09-29 ENCOUNTER — Other Ambulatory Visit: Payer: Self-pay

## 2018-09-29 ENCOUNTER — Ambulatory Visit: Payer: Medicaid Other | Admitting: Internal Medicine

## 2018-09-29 ENCOUNTER — Encounter: Payer: Self-pay | Admitting: Internal Medicine

## 2018-09-29 VITALS — BP 160/99 | HR 72 | Temp 98.1°F | Ht 65.5 in | Wt 343.1 lb

## 2018-09-29 DIAGNOSIS — M7918 Myalgia, other site: Secondary | ICD-10-CM

## 2018-09-29 DIAGNOSIS — G8929 Other chronic pain: Secondary | ICD-10-CM

## 2018-09-29 DIAGNOSIS — R1012 Left upper quadrant pain: Secondary | ICD-10-CM | POA: Diagnosis not present

## 2018-09-29 DIAGNOSIS — R109 Unspecified abdominal pain: Principal | ICD-10-CM

## 2018-09-29 NOTE — Patient Instructions (Signed)
Claudia Brady,  It was a pleasure taking care of you at the clinic today and sorry to hear about your ongoing left side pain.  Today, we did well we call " trigger point injection with lidocaine" hopefully this should provide you some relief.  Please continue your daily exercise routine You can do slow stretches  Also as discussed, you can try the lidocaine patches.  Dr. Dortha Schwalbe  Please call the internal medicine center clinic if you have any questions or concerns, we may be able to help and keep you from a long and expensive emergency room wait. Our clinic and after hours phone number is [phone], the best time to call is Monday through Friday 9 am to 4 pm but there is always someone available 24/7 if you have an emergency. If you need medication refills please notify your pharmacy one week in advance and they will send [place] a request.     Muscle Strain A muscle strain is an injury that happens when a muscle is stretched longer than normal. This can happen during a fall, sports, or lifting. This can tear some muscle fibers. Usually, recovery from muscle strain takes 1-2 weeks. Complete healing normally takes 5-6 weeks. This condition is first treated with PRICE therapy. This involves:  Protecting your muscle from being injured again.  Resting your injured muscle.  Icing your injured muscle.  Applying pressure (compression) to your injured muscle. This may be done with a splint or elastic bandage.  Raising (elevating) your injured muscle. Your doctor may also recommend medicine for pain. Follow these instructions at home: If you have a splint:  Wear the splint as told by your doctor. Take it off only as told by your doctor.  Loosen the splint if your fingers or toes tingle, get numb, or turn cold and blue.  Keep the splint clean.  If the splint is not waterproof: ? Do not let it get wet. ? Cover it with a watertight covering when you take a bath or a shower. Managing pain,  stiffness, and swelling   If directed, put ice on your injured area. ? If you have a removable splint, take it off as told by your doctor. ? Put ice in a plastic bag. ? Place a towel between your skin and the bag. ? Leave the ice on for 20 minutes, 2-3 times a day.  Move your fingers or toes often. This helps to avoid stiffness and lessen swelling.  Raise your injured area above the level of your heart while you are sitting or lying down.  Wear an elastic bandage as told by your doctor. Make sure it is not too tight. General instructions  Take over-the-counter and prescription medicines only as told by your doctor.  Limit your activity. Rest your injured muscle as told by your doctor. Your doctor may say that gentle movements are okay.  If physical therapy was prescribed, do exercises as told by your doctor.  Do not put pressure on any part of the splint until it is fully hardened. This may take many hours.  Do not use any products that contain nicotine or tobacco, such as cigarettes and e-cigarettes. These can delay bone healing. If you need help quitting, ask your doctor.  Warm up before you exercise. This helps to prevent more muscle strains.  Ask your doctor when it is safe to drive if you have a splint.  Keep all follow-up visits as told by your doctor. This is important. Contact a doctor  if:  You have more pain or swelling in your injured area. Get help right away if:  You have any of these problems in your injured area: ? You have numbness. ? You have tingling. ? You lose a lot of strength. Summary  A muscle strain is an injury that happens when a muscle is stretched longer than normal.  This condition is first treated with PRICE therapy. This includes protecting, resting, icing, adding pressure, and raising your injury.  Limit your activity. Rest your injured muscle as told by your doctor. Your doctor may say that gentle movements are okay.  Warm up before you  exercise. This helps to prevent more muscle strains. This information is not intended to replace advice given to you by your health care provider. Make sure you discuss any questions you have with your health care provider. Document Released: 04/07/2008 Document Revised: 08/05/2016 Document Reviewed: 08/05/2016 Elsevier Interactive Patient Education  2019 [employer].

## 2018-09-29 NOTE — Progress Notes (Signed)
   CC: Chronic left upper quadrant pain  HPI:  [name] is a 58 y.o. with medical history listed below presenting for evaluation of chronic left upper quadrant pain.  Please see problem based charting for further details.  Chronic left upper quadrant pain: [name] unfortunately has been suffering from intermittent left upper quadrant pain with onset July 2019 which spontaneously subsided however reoccurred 2 months ago.  She was last evaluated by Dr. Cleaster Corin on September 14, 2018.  She reports that the pain is sharp in nature and typically worsens when she lies on her left side or twists/rotate her body.  She denies any relation with food, nausea, vomiting, epigastric pain, melena, history of pancreatitis.  Currently she denies any pain at the site but states that intermittently pain is around 4/10.  During her last visit with Dr. Cleaster Corin, she was sent for x-ray of her ribs which was unremarkable.   On assessment today, she points to area of pain which is located at the left upper quadrant right below the left lower rib.  PROCEDURE: Trigger point injection of left sided muscular layer of the upper abdomen  The area was cleaned with alcohol wipes and a 27-gauge needle was used to inject through the subcutaneous layer into the muscle at the area of maximal pain.  A total of 5 cc of 1% lidocaine without epinephrine was injected.  There were no complications   Plan: -Continue daily activities -Trial of lidocaine patch -Advised to avoid triggers to pain for the next couple weeks.  Past Medical History:  Diagnosis Date  . ATRIAL FIBRILLATION 08/20/2010   Qualifier: History of  By: Excell Seltzer CMA, Lawson Fiscal    . Chronic pain   . Duodenal ulcer Jan. 2012   Upper GI bleeding 2nd to NSAID use  . GERD (gastroesophageal reflux disease)   . HIV (human immunodeficiency virus infection) (HCC) 2018  . Hypertension   . Iron deficiency anemia   . OSA (obstructive sleep apnea)   . Plantar fascia  syndrome   . RESTLESS LEG SYNDROME 08/20/2010       . Rhinitis   . Varicose vein of leg    Review of Systems  Musculoskeletal:       -Pain at the left upper quadrant just below the left lower rib.   Physical Exam:  Vitals:   09/29/18 1314  BP: (!) 160/99  Pulse: 72  Temp: 98.1 F (36.7 C)  TempSrc: Oral  SpO2: 98%  Weight: (!) 343 lb 1.6 oz (155.6 kg)  Height: 5' 5.5" (1.664 m)   Physical Exam  Constitutional: She is well-developed, well-nourished, and in no distress.  HENT:  Head: Normocephalic and atraumatic.  Cardiovascular: Normal rate and regular rhythm. Exam reveals no gallop and no friction rub.  No murmur heard. Pulmonary/Chest: Effort normal and breath sounds normal. She has no wheezes. She has no rales.  Abdominal: Soft. There is no abdominal tenderness.  Distended secondary to body habitus.  Musculoskeletal:        General: Tenderness present.  Skin:     Psychiatric: Affect and judgment normal.    Assessment & Plan:   See Encounters Tab for problem based charting.  Patient discussed with Dr. Cleda Daub

## 2018-09-30 ENCOUNTER — Inpatient Hospital Stay: Admission: RE | Admit: 2018-09-30 | Payer: Self-pay | Source: Ambulatory Visit

## 2018-09-30 NOTE — Progress Notes (Signed)
Internal Medicine Clinic Attending  I saw and evaluated the patient.  I personally confirmed the key portions of the history and exam documented by Dr. Agyei and I reviewed pertinent patient test results.  The assessment, diagnosis, and plan were formulated together and I agree with the documentation in the resident's note.  

## 2018-10-05 ENCOUNTER — Encounter: Payer: Self-pay | Admitting: Internal Medicine

## 2018-10-24 ENCOUNTER — Other Ambulatory Visit: Payer: Self-pay | Admitting: Internal Medicine

## 2018-10-24 DIAGNOSIS — I10 Essential (primary) hypertension: Secondary | ICD-10-CM

## 2018-11-14 ENCOUNTER — Encounter: Payer: Self-pay | Admitting: Internal Medicine

## 2018-11-22 ENCOUNTER — Encounter: Payer: Self-pay | Admitting: Internal Medicine

## 2018-11-22 NOTE — Telephone Encounter (Signed)
Return call from pt -stated she has been having blood in stools, on and off since Sunday. Straining sometimes;reports diarrhea and solid stools. No hemorrhoids noted. And stools are bright red to dark in color. Offered appt for tomorrow; no transportation - tele health visit offered/explained. Agreed to tele health visit - tomorrow 5/13 @ 1415 with PCP.

## 2018-11-23 ENCOUNTER — Ambulatory Visit: Payer: Medicaid Other

## 2018-11-23 ENCOUNTER — Encounter: Payer: Medicaid Other | Admitting: Internal Medicine

## 2018-12-01 ENCOUNTER — Other Ambulatory Visit: Payer: Self-pay | Admitting: Internal Medicine

## 2018-12-01 DIAGNOSIS — G43809 Other migraine, not intractable, without status migrainosus: Secondary | ICD-10-CM

## 2019-02-15 ENCOUNTER — Other Ambulatory Visit: Payer: Self-pay | Admitting: Infectious Diseases

## 2019-02-15 DIAGNOSIS — B20 Human immunodeficiency virus [HIV] disease: Secondary | ICD-10-CM

## 2019-03-01 ENCOUNTER — Encounter: Payer: Medicaid Other | Admitting: Internal Medicine

## 2019-04-04 ENCOUNTER — Telehealth: Payer: Self-pay | Admitting: Internal Medicine

## 2019-04-04 NOTE — Telephone Encounter (Signed)
rtc to pt, she states this started 9/21, no fevers, "more of a head cold" Instructions given and she is agreeable

## 2019-04-04 NOTE — Telephone Encounter (Signed)
If no fever, cough, shortness of breath than yes she can come in person.  Just use standard precautions and notify the front desk when she arrives of her symptoms.  Thanks

## 2019-04-04 NOTE — Telephone Encounter (Signed)
Please advise if pt can come to face to face appt or needs TELEHEALTH

## 2019-04-04 NOTE — Telephone Encounter (Signed)
Pt is saying her nose and eyes is running, can she still come to her appt tomorrow. Pls contact [phone] so she can contact transportation

## 2019-04-05 ENCOUNTER — Encounter: Payer: Self-pay | Admitting: Internal Medicine

## 2019-04-05 ENCOUNTER — Ambulatory Visit (INDEPENDENT_AMBULATORY_CARE_PROVIDER_SITE_OTHER): Payer: Medicare Other | Admitting: Internal Medicine

## 2019-04-05 ENCOUNTER — Other Ambulatory Visit: Payer: Self-pay

## 2019-04-05 VITALS — BP 175/78 | HR 78 | Temp 98.8°F | Wt 370.8 lb

## 2019-04-05 DIAGNOSIS — R7309 Other abnormal glucose: Secondary | ICD-10-CM

## 2019-04-05 DIAGNOSIS — Z6841 Body Mass Index (BMI) 40.0 and over, adult: Secondary | ICD-10-CM

## 2019-04-05 DIAGNOSIS — Z7722 Contact with and (suspected) exposure to environmental tobacco smoke (acute) (chronic): Secondary | ICD-10-CM

## 2019-04-05 DIAGNOSIS — I10 Essential (primary) hypertension: Secondary | ICD-10-CM

## 2019-04-05 DIAGNOSIS — F321 Major depressive disorder, single episode, moderate: Secondary | ICD-10-CM

## 2019-04-05 DIAGNOSIS — B20 Human immunodeficiency virus [HIV] disease: Secondary | ICD-10-CM

## 2019-04-05 DIAGNOSIS — I872 Venous insufficiency (chronic) (peripheral): Secondary | ICD-10-CM

## 2019-04-05 DIAGNOSIS — R635 Abnormal weight gain: Secondary | ICD-10-CM

## 2019-04-05 DIAGNOSIS — Z79899 Other long term (current) drug therapy: Secondary | ICD-10-CM

## 2019-04-05 DIAGNOSIS — F419 Anxiety disorder, unspecified: Secondary | ICD-10-CM | POA: Diagnosis not present

## 2019-04-05 DIAGNOSIS — Z21 Asymptomatic human immunodeficiency virus [HIV] infection status: Secondary | ICD-10-CM

## 2019-04-05 DIAGNOSIS — Z0000 Encounter for general adult medical examination without abnormal findings: Secondary | ICD-10-CM | POA: Insufficient documentation

## 2019-04-05 DIAGNOSIS — F329 Major depressive disorder, single episode, unspecified: Secondary | ICD-10-CM | POA: Diagnosis not present

## 2019-04-05 DIAGNOSIS — K649 Unspecified hemorrhoids: Secondary | ICD-10-CM | POA: Diagnosis not present

## 2019-04-05 LAB — GLUCOSE, CAPILLARY: Glucose-Capillary: 79 mg/dL (ref 70–99)

## 2019-04-05 LAB — POCT GLYCOSYLATED HEMOGLOBIN (HGB A1C): Hemoglobin A1C: 5.4 % (ref 4.0–5.6)

## 2019-04-05 MED ORDER — HYDROCHLOROTHIAZIDE 25 MG PO TABS: 25.0000 mg | ORAL_TABLET | Freq: Every day | ORAL | 2 refills | Status: DC

## 2019-04-05 NOTE — Progress Notes (Signed)
   CC: hypertension  HPI:  Ms.Yoona Carbonell is a 58 y.o. with PMH as below. Medications include norvasc 10 mg, losartan 50 mg qd, metoprolol 25 mg and lasix 40 mg qd. She has not been taking amlodipine or lasix as she feels like this is causing full body cramps. She endorses LE swelling and has not been able to get new compression stockings as this would require traveling to [facility] due to her legs being larger since gaining weight. She has occasional SOB but only when she walks. She has had >150lb weight gain since 2018 and 40lbs since March due to being sedentary as she has remained indoors due to the virus She has had increased anxiety associated with this as well. She denies constipation, abdominal pain, dizziness, chest pain or changes in urination.  She does not smoke but her boyfriend does which she has been asking him to stop.   Please see A&P for assessment of the patient's acute and chronic medical conditions.    Past Medical History:  Diagnosis Date  . ATRIAL FIBRILLATION 08/20/2010   Qualifier: History of  By: Burt Knack CMA, Cecille Rubin    . Chronic pain   . Duodenal ulcer Jan. 2012   Upper GI bleeding 2nd to NSAID use  . GERD (gastroesophageal reflux disease)   . HIV (human immunodeficiency virus infection) ([place]) 2018  . Hypertension   . Iron deficiency anemia   . OSA (obstructive sleep apnea)   . Plantar fascia syndrome   . RESTLESS LEG SYNDROME 08/20/2010       . Rhinitis   . Varicose vein of leg    Review of Systems:   ROS negative except as noted in HPI.   Physical Exam:  Constitution: NAD, morbidly obese  Cardio: RRR, no m/r/g, trace LE edema, non-pitting Respiratory: CTA, no wheezing rales or rhonchi  Neuro: a&o, normal affect Skin: c/d/i    Vitals:   04/05/19 1032 04/05/19 1059  BP: (!) 211/125 (!) 175/78  Pulse: 88 78  Temp: 98.8 F (37.1 C)   TempSrc: Oral   SpO2: 98%   Weight: (!) 370 lb 12.8 oz (168.2 kg)      Assessment & Plan:   See Encounters Tab  for problem based charting.  Patient discussed with Dr. Lynnae January

## 2019-04-05 NOTE — Assessment & Plan Note (Signed)
Previously had blood in her stool and called into clinic but cancelled appt due to not having a ride. She states this was a small amount of BRB after BM and has not occurred again. She denies tingling or fatigue. Overall symptoms are consistent with hemorrhoid. She is not having constipation currently but recommended she schedule f/u appointment if this occurs again. Last colonoscopy 2015 without acute findings per pt although no records available as this was done in [place].

## 2019-04-05 NOTE — Patient Instructions (Signed)
Thank you for allowing [place] to provide your care today. Today we discussed your hypertension and wanting to exercise more.    I have ordered the following labs for you:  Basic metabolic panel, hemoglobin a1c   I will call if any are abnormal.    Today we made the following changes to your medications:   Please START taking  Hydrochlorothiazide 25 mg - take one tablet per day.   Please STOP taking   Lasix  Amlodipine   Please follow-up in one month for blood pressure check, labs, and Pap Smear.    Please call the internal medicine center clinic if you have any questions or concerns, we may be able to help and keep you from a long and expensive emergency room wait. Our clinic and after hours phone number is [phone], the best time to call is Monday through Friday 9 am to 4 pm but there is always someone available 24/7 if you have an emergency. If you need medication refills please notify your pharmacy one week in advance and they will send [place] a request.

## 2019-04-05 NOTE — Assessment & Plan Note (Signed)
She hasn't been out of the home because of covid. She doesn't leave the house and has had a 40lb weight gain since march and >150lbs since 2018. Feels that she started gaining weight after starting biktarvy but this is not a reported side effect. We discussed exercises that can be done at home and provided handout for sitting exercises.   - f/u one month to discuss progress  - TSH ordered

## 2019-04-05 NOTE — Assessment & Plan Note (Signed)
F/u planned with ID in May, but she has not gone anywhere due to covid. Recommended contacted ID to schedule follow-up. She has been taking her biktarvy without issue.

## 2019-04-05 NOTE — Assessment & Plan Note (Addendum)
PHQ-9 8 today and GAD-7 is 8. She states she has been having ongoing anxiety, especially with conern to the virus.   - Address at follow-up in one month if she would like to speak with Miquel Dunn and other resources.

## 2019-04-05 NOTE — Assessment & Plan Note (Signed)
BP Readings from Last 3 Encounters:  04/05/19 (!) 175/78  09/29/18 (!) 160/99  09/14/18 139/90   Medications include norvasc 10 mg, losartan 50 mg qd, metoprolol 25 mg and lasix 40 mg qd. She has not been taking amlodipine or lasix as she feels like this is causing full body cramps. She endorses LE swelling and has not been able to get new compression stockings as this would require traveling to [facility] due to her legs being larger since gaining weight. She has occasional SOB but only when she walks.  - switch amlodipine and lasix back to HCTZ 25 mg qd, which she tolerated well in the past. No pitting edema on PE.  - BMP today

## 2019-04-05 NOTE — Assessment & Plan Note (Signed)
-   influenza vaccine - she does not want flu shot today as she feels it makes her sick.  - hx of elevated glucose, will check a1c today - pap smear at f/u in one month

## 2019-04-06 LAB — TSH: TSH: 3.61 u[IU]/mL (ref 0.450–4.500)

## 2019-04-06 LAB — BMP8+ANION GAP
Anion Gap: 15 mmol/L (ref 10.0–18.0)
BUN/Creatinine Ratio: 11 (ref 9–23)
BUN: 9 mg/dL (ref 6–24)
CO2: 24 mmol/L (ref 20–29)
Calcium: 9.1 mg/dL (ref 8.7–10.2)
Chloride: 100 mmol/L (ref 96–106)
Creatinine, Ser: 0.79 mg/dL (ref 0.57–1.00)
GFR calc Af Amer: 95 mL/min/{1.73_m2} (ref >59)
GFR calc non Af Amer: 83 mL/min/{1.73_m2} (ref >59)
Glucose: 79 mg/dL (ref 65–99)
Potassium: 4.5 mmol/L (ref 3.5–5.2)
Sodium: 139 mmol/L (ref 134–144)

## 2019-04-07 ENCOUNTER — Encounter: Payer: Self-pay | Admitting: Internal Medicine

## 2019-04-10 NOTE — Progress Notes (Signed)
Internal Medicine Clinic Attending  Case discussed with Dr. Seawell at the time of the visit.  We reviewed the resident's history and exam and pertinent patient test results.  I agree with the assessment, diagnosis, and plan of care documented in the resident's note.    

## 2019-05-03 ENCOUNTER — Other Ambulatory Visit: Payer: Self-pay

## 2019-05-03 ENCOUNTER — Encounter: Payer: Self-pay | Admitting: Internal Medicine

## 2019-05-03 ENCOUNTER — Ambulatory Visit (INDEPENDENT_AMBULATORY_CARE_PROVIDER_SITE_OTHER): Payer: Medicare Other | Admitting: Internal Medicine

## 2019-05-03 VITALS — BP 174/94 | HR 75 | Temp 98.5°F | Wt 374.5 lb

## 2019-05-03 DIAGNOSIS — I10 Essential (primary) hypertension: Secondary | ICD-10-CM

## 2019-05-03 DIAGNOSIS — B20 Human immunodeficiency virus [HIV] disease: Secondary | ICD-10-CM

## 2019-05-03 DIAGNOSIS — M7989 Other specified soft tissue disorders: Secondary | ICD-10-CM

## 2019-05-03 DIAGNOSIS — F419 Anxiety disorder, unspecified: Secondary | ICD-10-CM | POA: Diagnosis not present

## 2019-05-03 DIAGNOSIS — Z6841 Body Mass Index (BMI) 40.0 and over, adult: Secondary | ICD-10-CM

## 2019-05-03 DIAGNOSIS — Z79899 Other long term (current) drug therapy: Secondary | ICD-10-CM

## 2019-05-03 DIAGNOSIS — F329 Major depressive disorder, single episode, unspecified: Secondary | ICD-10-CM | POA: Diagnosis not present

## 2019-05-03 DIAGNOSIS — F321 Major depressive disorder, single episode, moderate: Secondary | ICD-10-CM

## 2019-05-03 MED ORDER — DULOXETINE HCL 30 MG PO CPEP: ORAL_CAPSULE | ORAL | 2 refills | Status: DC

## 2019-05-03 NOTE — Progress Notes (Signed)
   CC: hypertension  HPI:  Ms.Tayley Codd is a 58 y.o. with PMH as below.   Please see A&P for assessment of the patient's acute and chronic medical conditions.    Past Medical History:  Diagnosis Date  . ATRIAL FIBRILLATION 08/20/2010   Qualifier: History of  By: Burt Knack CMA, Cecille Rubin    . Chronic pain   . Duodenal ulcer Jan. 2012   Upper GI bleeding 2nd to NSAID use  . GERD (gastroesophageal reflux disease)   . HIV (human immunodeficiency virus infection) ([place]) 2018  . Hypertension   . Iron deficiency anemia   . OSA (obstructive sleep apnea)   . Plantar fascia syndrome   . RESTLESS LEG SYNDROME 08/20/2010       . Rhinitis   . Varicose vein of leg    Review of Systems:   Review of Systems  Respiratory: Negative for cough, shortness of breath and wheezing.   Cardiovascular: Positive for leg swelling. Negative for chest pain and palpitations.  Gastrointestinal: Negative for abdominal pain, heartburn, nausea and vomiting.  Musculoskeletal:       Cramps with medications  Psychiatric/Behavioral: Positive for depression. Negative for suicidal ideas. The patient is nervous/anxious. The patient does not have insomnia.    Physical Exam:  Constitution: NAD, morbidly obese HENT: AT/[place] Cardio: RRR, +1 LE edema, pitting Respiratory: non-labored breathing Neuro: a&o, normal affect Skin: c/d/i    Vitals:   05/03/19 1537 05/03/19 1546 05/03/19 1628  BP:  (!) 192/123 (!) 174/94  Pulse:  81 75  Temp:  98.5 F (36.9 C)   TempSrc:  Oral   SpO2:  98%   Weight: (!) 374 lb 8 oz (169.9 kg) (!) 374 lb 8 oz (169.9 kg)      Assessment & Plan:   See Encounters Tab for problem based charting.  Patient discussed with Dr. Lynnae January

## 2019-05-03 NOTE — Patient Instructions (Addendum)
Thank you for allowing [place] to provide your care today. Today we discussed your anxiety and hypertension  I have ordered the following labs for you:   Today we made the following changes to your medications:   Please START taking  Duloxetine 30 mg - take 1 tablet two times per day for one week THEN Start taking 2 tablets (60mg ) once per day  Please take your HCTZ in the afternoon  Please follow-up in one month for labs.    Please call the internal medicine center clinic if you have any questions or concerns, we may be able to help and keep you from a long and expensive emergency room wait. Our clinic and after hours phone number is [phone], the best time to call is Monday through Friday 9 am to 4 pm but there is always someone available 24/7 if you have an emergency. If you need medication refills please notify your pharmacy one week in advance and they will send [place] a request.

## 2019-05-04 ENCOUNTER — Other Ambulatory Visit: Payer: Medicare Other

## 2019-05-04 DIAGNOSIS — B20 Human immunodeficiency virus [HIV] disease: Secondary | ICD-10-CM

## 2019-05-05 LAB — T-HELPER CELL (CD4) - (RCID CLINIC ONLY)
CD4 % Helper T Cell: 46 % (ref 33–65)
CD4 T Cell Abs: 776 /uL (ref 400–1790)

## 2019-05-09 LAB — CBC WITH DIFFERENTIAL/PLATELET
Absolute Monocytes: 475 cells/uL (ref 200–950)
Basophils Absolute: 38 cells/uL (ref 0–200)
Basophils Relative: 0.8 %
Eosinophils Absolute: 158 cells/uL (ref 15–500)
Eosinophils Relative: 3.3 %
HCT: 36.1 % (ref 35.0–45.0)
Hemoglobin: 11.6 g/dL — ABNORMAL LOW (ref 11.7–15.5)
Lymphs Abs: 1608 cells/uL (ref 850–3900)
MCH: 27.1 pg (ref 27.0–33.0)
MCHC: 32.1 g/dL (ref 32.0–36.0)
MCV: 84.3 fL (ref 80.0–100.0)
MPV: 11.5 fL (ref 7.5–12.5)
Monocytes Relative: 9.9 %
Neutro Abs: 2520 cells/uL (ref 1500–7800)
Neutrophils Relative %: 52.5 %
Platelets: 283 10*3/uL (ref 140–400)
RBC: 4.28 10*6/uL (ref 3.80–5.10)
RDW: 15 % (ref 11.0–15.0)
Total Lymphocyte: 33.5 %
WBC: 4.8 10*3/uL (ref 3.8–10.8)

## 2019-05-09 LAB — COMPREHENSIVE METABOLIC PANEL
AG Ratio: 1 (calc) (ref 1.0–2.5)
ALT: 12 U/L (ref 6–29)
AST: 21 U/L (ref 10–35)
Albumin: 3.7 g/dL (ref 3.6–5.1)
Alkaline phosphatase (APISO): 84 U/L (ref 37–153)
BUN: 9 mg/dL (ref 7–25)
CO2: 28 mmol/L (ref 20–32)
Calcium: 9.2 mg/dL (ref 8.6–10.4)
Chloride: 104 mmol/L (ref 98–110)
Creat: 0.77 mg/dL (ref 0.50–1.05)
Globulin: 3.7 g/dL (calc) (ref 1.9–3.7)
Glucose, Bld: 113 mg/dL — ABNORMAL HIGH (ref 65–99)
Potassium: 4.6 mmol/L (ref 3.5–5.3)
Sodium: 140 mmol/L (ref 135–146)
Total Bilirubin: 0.4 mg/dL (ref 0.2–1.2)
Total Protein: 7.4 g/dL (ref 6.1–8.1)

## 2019-05-09 LAB — HIV-1 RNA QUANT-NO REFLEX-BLD
HIV 1 RNA Quant: <20 copies/mL
HIV-1 RNA Quant, Log: <1.3 Log copies/mL

## 2019-05-09 NOTE — Assessment & Plan Note (Signed)
PHQ-9 and GAD-7 previously elevated. She does endorse a lot of anxiety and some depression. She thinks this is related to being isolated with covid going on. She feels that she needs to get out of her house more and walk around. She is interested in speaking with Miquel Dunn and trying a medication to help with anxiety and depression.   - start duloxetine 30 mg for one week, then increase to 60 mg - referral placed to [facility]

## 2019-05-09 NOTE — Assessment & Plan Note (Signed)
BP Readings from Last 3 Encounters:  05/03/19 (!) 174/94  04/05/19 (!) 175/78  09/29/18 (!) 160/99   Blood pressure elevated during previous visit due to patient not taking bp medications due to side effects. She was switched from norvasc and lasix back to HCTZ 25 mg qd. She took this once but did not like taking it alongside the [place].  She checks bp at home and bottom number is still 3 numbers in the evening.  She is willing to take HCTZ 25 mg in the afternoon so that it is not taken with her cozaar or metoprolol.   - start HCTZ 25 mg - cont cozaar 50 mg qd, and metoprolol 25 mg  - f/u one month for BMP

## 2019-05-10 NOTE — Progress Notes (Signed)
Internal Medicine Clinic Attending  Case discussed with Dr. Seawell at the time of the visit.  We reviewed the resident's history and exam and pertinent patient test results.  I agree with the assessment, diagnosis, and plan of care documented in the resident's note.    

## 2019-05-17 ENCOUNTER — Telehealth: Payer: Self-pay | Admitting: Licensed Clinical Social Worker

## 2019-05-17 ENCOUNTER — Encounter: Payer: Self-pay | Admitting: Licensed Clinical Social Worker

## 2019-05-17 NOTE — Telephone Encounter (Signed)
Patient was called (1st attempt) to discuss the referral from her doctor. Patient did not answer, and a vm was left for the pt to call our office back.

## 2019-05-18 ENCOUNTER — Ambulatory Visit: Payer: Medicare Other | Admitting: Internal Medicine

## 2019-06-01 ENCOUNTER — Telehealth: Payer: Self-pay | Admitting: Licensed Clinical Social Worker

## 2019-06-01 NOTE — Telephone Encounter (Signed)
Patient called, 2nd attempt, and did not answer. Vm was left for the patient.

## 2019-06-14 NOTE — Addendum Note (Signed)
Addended by: Hulan Fray on: 06/14/2019 06:04 PM   Modules accepted: Orders

## 2019-09-06 ENCOUNTER — Other Ambulatory Visit: Payer: Self-pay | Admitting: Internal Medicine

## 2019-09-06 ENCOUNTER — Encounter: Payer: Medicare Other | Admitting: Internal Medicine

## 2019-09-06 DIAGNOSIS — Z0000 Encounter for general adult medical examination without abnormal findings: Secondary | ICD-10-CM

## 2019-10-04 ENCOUNTER — Emergency Department (HOSPITAL_COMMUNITY)
Admission: EM | Admit: 2019-10-04 | Discharge: 2019-10-05 | Disposition: A | Discharge status: Home / Self Care | Payer: Medicare Other | Attending: Emergency Medicine | Admitting: Emergency Medicine

## 2019-10-04 ENCOUNTER — Encounter (HOSPITAL_COMMUNITY): Payer: Self-pay | Admitting: Emergency Medicine

## 2019-10-04 ENCOUNTER — Emergency Department (HOSPITAL_COMMUNITY): Payer: Medicare Other

## 2019-10-04 ENCOUNTER — Ambulatory Visit (INDEPENDENT_AMBULATORY_CARE_PROVIDER_SITE_OTHER): Payer: Medicare Other | Admitting: Internal Medicine

## 2019-10-04 ENCOUNTER — Other Ambulatory Visit: Payer: Self-pay

## 2019-10-04 DIAGNOSIS — Z79899 Other long term (current) drug therapy: Secondary | ICD-10-CM | POA: Diagnosis not present

## 2019-10-04 DIAGNOSIS — N3000 Acute cystitis without hematuria: Secondary | ICD-10-CM | POA: Diagnosis not present

## 2019-10-04 DIAGNOSIS — R252 Cramp and spasm: Secondary | ICD-10-CM

## 2019-10-04 DIAGNOSIS — Z21 Asymptomatic human immunodeficiency virus [HIV] infection status: Secondary | ICD-10-CM | POA: Insufficient documentation

## 2019-10-04 DIAGNOSIS — R1013 Epigastric pain: Secondary | ICD-10-CM

## 2019-10-04 DIAGNOSIS — R0789 Other chest pain: Secondary | ICD-10-CM

## 2019-10-04 DIAGNOSIS — I10 Essential (primary) hypertension: Secondary | ICD-10-CM | POA: Diagnosis not present

## 2019-10-04 LAB — TROPONIN I (HIGH SENSITIVITY)
Troponin I (High Sensitivity): 4 ng/L (ref <18)
Troponin I (High Sensitivity): 5 ng/L (ref <18)

## 2019-10-04 LAB — CBC
HCT: 38 % (ref 36.0–46.0)
Hemoglobin: 11.7 g/dL — ABNORMAL LOW (ref 12.0–15.0)
MCH: 28.5 pg (ref 26.0–34.0)
MCHC: 30.8 g/dL (ref 30.0–36.0)
MCV: 92.5 fL (ref 80.0–100.0)
Platelets: 303 10*3/uL (ref 150–400)
RBC: 4.11 MIL/uL (ref 3.87–5.11)
RDW: 15.5 % (ref 11.5–15.5)
WBC: 4.8 10*3/uL (ref 4.0–10.5)
nRBC: 0 % (ref 0.0–0.2)

## 2019-10-04 LAB — BASIC METABOLIC PANEL
Anion gap: 7 (ref 5–15)
BUN: 6 mg/dL (ref 6–20)
CO2: 29 mmol/L (ref 22–32)
Calcium: 8.9 mg/dL (ref 8.9–10.3)
Chloride: 102 mmol/L (ref 98–111)
Creatinine, Ser: 1.12 mg/dL — ABNORMAL HIGH (ref 0.44–1.00)
GFR calc Af Amer: >60 mL/min (ref >60)
GFR calc non Af Amer: 54 mL/min — ABNORMAL LOW (ref >60)
Glucose, Bld: 97 mg/dL (ref 70–99)
Potassium: 4.1 mmol/L (ref 3.5–5.1)
Sodium: 138 mmol/L (ref 135–145)

## 2019-10-04 LAB — I-STAT BETA HCG BLOOD, ED (MC, WL, AP ONLY): I-stat hCG, quantitative: <5 m[IU]/mL (ref <5)

## 2019-10-04 MED ORDER — SODIUM CHLORIDE 0.9% FLUSH: 3.0000 mL | Freq: Once | INTRAVENOUS | Status: DC

## 2019-10-04 NOTE — Progress Notes (Signed)
   CC: hypertension  This is a telephone encounter between Claudia Brady and Claudia Brady on 10/04/2019 for hypertension. The visit was conducted with the patient located at home and Claudia Brady at [facility]. The patient's identity was confirmed using their DOB and current address. The patient has consented to being evaluated through a telephone encounter and understands the associated risks (an examination cannot be done and the patient may need to come in for an appointment) / benefits (allows the patient to remain at home, decreasing exposure to coronavirus). I personally spent 16 minutes on medical discussion.   HPI:  [name] is a 59 y.o. with PMH as below.   Please see A&P for assessment of the patient's acute and chronic medical conditions.    Past Medical History:  Diagnosis Date  . ATRIAL FIBRILLATION 08/20/2010   Qualifier: History of  By: Excell Seltzer CMA, Lawson Fiscal    . Chronic pain   . Duodenal ulcer Jan. 2012   Upper GI bleeding 2nd to NSAID use  . GERD (gastroesophageal reflux disease)   . HIV (human immunodeficiency virus infection) (H[facility]) 2018  . Hypertension   . Iron deficiency anemia   . OSA (obstructive sleep apnea)   . Plantar fascia syndrome   . RESTLESS LEG SYNDROME 08/20/2010       . Rhinitis   . Varicose vein of leg    Review of Systems:   Review of Systems  Constitutional: Negative for chills, diaphoresis and fever.  Respiratory: Positive for shortness of breath. Negative for cough and wheezing.   Cardiovascular: Positive for leg swelling.       Chest tightness with rest and with exertion  Gastrointestinal: Negative for abdominal pain, diarrhea, nausea and vomiting.  Genitourinary: Negative for dysuria and frequency.  Neurological: Positive for dizziness. Negative for tingling and focal weakness.    Assessment & Plan:   See Encounters Tab for problem based charting.  Patient discussed with Dr. Antony Contras

## 2019-10-04 NOTE — Assessment & Plan Note (Signed)
Checked her blood pressure recently and it was 211/112. She felt dizzy when it happened. She doesn't feel dizzy now but has been having exertional and non-exertional chest tightness, like a weight. She denies nausea, arm numbness or tingling. She denies shortness of breath, cough. She endorses LE swelling. She has not been taking her  HCTZ 25 mg, which was restarted at last visit due to elevated blood pressures. Other medications include cozaar 50 mg qd and metoprolol 25 mg bid and she is only taking the metoprolol occasionally. She otherwise has been taking her duloxetine for depression without issue.   - Recommended she go to the ER with symptoms concerning for ACS and hypertensive urgency vs. Emergency.  - will have her follow-up in [facility] or with me in clinic after this.

## 2019-10-04 NOTE — ED Triage Notes (Signed)
Pt in POV. Reports intermittent CP X1 week. Central, non radiating, 5/10. States pain worsens with exertion.

## 2019-10-05 DIAGNOSIS — R1013 Epigastric pain: Secondary | ICD-10-CM | POA: Diagnosis not present

## 2019-10-05 LAB — URINALYSIS, ROUTINE W REFLEX MICROSCOPIC
Bilirubin Urine: NEGATIVE
Glucose, UA: NEGATIVE mg/dL
Hgb urine dipstick: NEGATIVE
Ketones, ur: NEGATIVE mg/dL
Nitrite: NEGATIVE
Protein, ur: NEGATIVE mg/dL
Specific Gravity, Urine: 1.019 (ref 1.005–1.030)
pH: 5 (ref 5.0–8.0)

## 2019-10-05 LAB — HEPATIC FUNCTION PANEL
ALT: 18 U/L (ref 0–44)
AST: 26 U/L (ref 15–41)
Albumin: 3.6 g/dL (ref 3.5–5.0)
Alkaline Phosphatase: 81 U/L (ref 38–126)
Bilirubin, Direct: <0.1 mg/dL (ref 0.0–0.2)
Total Bilirubin: 0.6 mg/dL (ref 0.3–1.2)
Total Protein: 7.9 g/dL (ref 6.5–8.1)

## 2019-10-05 LAB — LIPASE, BLOOD: Lipase: 24 U/L (ref 11–51)

## 2019-10-05 LAB — CK: Total CK: 122 U/L (ref 38–234)

## 2019-10-05 LAB — BRAIN NATRIURETIC PEPTIDE: B Natriuretic Peptide: 14.2 pg/mL (ref 0.0–100.0)

## 2019-10-05 MED ORDER — DICYCLOMINE HCL 20 MG PO TABS: 20.0000 mg | ORAL_TABLET | Freq: Two times a day (BID) | ORAL | 0 refills | Status: DC

## 2019-10-05 MED ORDER — DICYCLOMINE HCL 10 MG PO CAPS
10.0000 mg | ORAL_CAPSULE | Freq: Once | ORAL | Status: AC
  Administered 2019-10-05: 10 mg via ORAL
  Filled 2019-10-05: qty 1

## 2019-10-05 MED ORDER — CIPROFLOXACIN HCL 250 MG PO TABS: 250.0000 mg | ORAL_TABLET | Freq: Two times a day (BID) | ORAL | 0 refills | Status: AC

## 2019-10-05 MED ORDER — CIPROFLOXACIN HCL 250 MG PO TABS: 250.0000 mg | ORAL_TABLET | Freq: Two times a day (BID) | ORAL | 0 refills | Status: DC

## 2019-10-05 NOTE — ED Provider Notes (Signed)
[facility] EMERGENCY DEPARTMENT Provider Note   CSN: [id number] Arrival date & time: 10/04/19  1535     History Chief Complaint  Patient presents with  . Chest Pain    Claudia Brady is a 59 y.o. female with a HIV (viral load undetectable), HTN, duodenal ulcer secondary to NSAID use who presents to the emergency department with a chief complaint of epigastric pain.  The patient reports that she received her first Covid vaccine on 3/20.  She reports that 24 hours later that she developed constant, nonradiating epigastric pain.  She characterizes the pain as "feeling gassy", denies increase in burping, or flatus.  She denies nausea, vomiting, diarrhea, constipation, dysuria, vaginal pain or discharge, back pain, fever, chills, cough, palpitations, numbness, weakness, chest pain.  She reports that she did have one episode of dizziness and lightheadedness several days ago while she was preparing a meal.  She is concerned that she was making Ramen noodles and smelled the packet before putting them in the boiling water that caused her to be lightheaded.  She has had no dizziness or lightheadedness with ambulation.  No syncope.  She does report that she has been feeling short of breath for years, but this has not worsened.  She also reports that her legs are chronically swollen, but not acutely swollen.  She does report that her ankles were swollen over the last few weeks, but this resolved with elevation.  She does report that she has been having muscle cramps that have been more frequent over the last week.  She is also noticed that her has been dark.  She reports that she voided while she was in the ER and the abdominal pain improved after emptying her bladder.   She does report that she has been eating more salads recently.  She states "I feel like I have not done any physical activity over the last year due to Covid, but I really been trying to increase my physical activity  and work my diet over the last few weeks."  No crash diets or dietary supplements.  No language interpreter was used.       Past Medical History:  Diagnosis Date  . ATRIAL FIBRILLATION 08/20/2010   Qualifier: History of  By: Excell Seltzer CMA, Lawson Fiscal    . Chronic pain   . Duodenal ulcer Jan. 2012   Upper GI bleeding 2nd to NSAID use  . GERD (gastroesophageal reflux disease)   . HIV (human immunodeficiency virus infection) ([facility]) 2018  . Hypertension   . Iron deficiency anemia   . OSA (obstructive sleep apnea)   . Plantar fascia syndrome   . RESTLESS LEG SYNDROME 08/20/2010       . Rhinitis   . Varicose vein of leg     Patient Active Problem List   Diagnosis Date Noted  . Chest tightness 10/04/2019  . Hemorrhoid 04/05/2019  . Healthcare maintenance 04/05/2019  . Migraine 09/17/2018  . Left upper quadrant pain 09/17/2018  . Venous insufficiency 09/16/2018  . Abnormal uterine bleeding (AUB) 02/04/2018  . Morbid obesity with BMI of 45.0-49.9, adult (HCC) 04/19/2017  . 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 HTN (hypertension) 10/23/2016  . Human immunodeficiency virus I infection (H[facility]) 10/02/2016  . Poor dentition 10/02/2016  . Depression 11/28/2010  . Obstructive sleep apnea 08/20/2010    Past Surgical History:  Procedure Laterality Date  . FOOT SURGERY    . TONSILLECTOMY    . TUBAL LIGATION       OB  History    Gravida  3   Para  2   Term  2   Preterm      AB  1   Living  2     SAB      TAB  1   Ectopic      Multiple      Live Births              Family History  Problem Relation Age of Onset  . Sleep apnea Other   . Diabetes Other   . Hypertension Other   . Asthma Other   . Emphysema Other   . Allergies Other     Social History   Tobacco Use  . Smoking status: Former Smoker    Years: 15.00    Types: Cigarettes    Quit date: 12/22/2015    Years since quitting: 3.7  . Smokeless tobacco: Never Used  Substance Use Topics  . Alcohol use: Yes    Comment:  occ  . Drug use: No    Home Medications Prior to Admission medications   Medication Sig Start Date End Date Taking? Authorizing Provider  BIKTARVY 50-200-25 MG TABS tablet TAKE 1 TABLET BY MOUTH DAILY 02/16/19   Judyann Munson, MD  ciprofloxacin (CIPRO) 250 MG tablet Take 1 tablet (250 mg total) by mouth every 12 (twelve) hours for 3 days. 10/05/19 10/08/19  Elisandro Jarrett A, PA-C  dicyclomine (BENTYL) 20 MG tablet Take 1 tablet (20 mg total) by mouth 2 (two) times daily. 10/05/19   Daquane Aguilar A, PA-C  DULoxetine (CYMBALTA) 30 MG capsule Take 1 tablet per day for 1 week, then increase to 2 tablets per day 05/03/19   Seawell, Jaimie A, DO  losartan (COZAAR) 50 MG tablet TAKE 1 TABLET(50 MG) BY MOUTH DAILY 10/24/18   Levert Feinstein, MD  metoprolol tartrate (LOPRESSOR) 25 MG tablet TAKE 1 TABLET(25 MG) BY MOUTH TWICE DAILY 12/01/18   Seawell, Jaimie A, DO  Multiple Vitamin (MULTIVITAMIN WITH MINERALS) TABS tablet Take 1 tablet by mouth daily.    [provider]    Allergies    Ace inhibitors, Other, and Penicillins  Review of Systems   Review of Systems  Constitutional: Negative for activity change, chills and fever.  HENT: Negative for congestion, sinus pressure and sinus pain.   Eyes: Negative for visual disturbance.  Respiratory: Negative for cough, shortness of breath (chronic) and wheezing.   Cardiovascular: Positive for leg swelling (chronic). Negative for chest pain and palpitations.  Gastrointestinal: Positive for abdominal pain. Negative for constipation, diarrhea, nausea and vomiting.  Genitourinary: Negative for dysuria, frequency, urgency, vaginal discharge and vaginal pain.  Musculoskeletal: Positive for myalgias. Negative for arthralgias, back pain, gait problem, neck pain and neck stiffness.  Skin: Negative for rash.  Allergic/Immunologic: Negative for immunocompromised state.  Neurological: Negative for dizziness, seizures, syncope, weakness, numbness and  headaches.  Psychiatric/Behavioral: Negative for confusion.    Physical Exam Updated Vital Signs BP (!) 151/51 (BP Location: Left Arm)   Pulse 65   Temp 98.3 F (36.8 C) (Oral)   Resp 20   Ht 5\' 5"  (1.651 m)   Wt (!) 167.8 kg   SpO2 97%   BMI 61.57 kg/m   Physical Exam Vitals and nursing note reviewed.  Constitutional:      General: She is not in acute distress.    Appearance: She is obese. She is not ill-appearing, toxic-appearing or diaphoretic.     Comments: Morbidly obese.  Pleasant,  no acute distress.  HENT:     Head: Normocephalic.  Eyes:     General: No scleral icterus.    Conjunctiva/sclera: Conjunctivae normal.     Pupils: Pupils are equal, round, and reactive to light.  Cardiovascular:     Rate and Rhythm: Normal rate and regular rhythm.     Pulses: Normal pulses.     Heart sounds: Normal heart sounds. No murmur. No friction rub. No gallop.   Pulmonary:     Effort: Pulmonary effort is normal. No respiratory distress.     Breath sounds: No stridor. No wheezing, rhonchi or rales.  Chest:     Chest wall: No tenderness.  Abdominal:     General: There is no distension.     Palpations: Abdomen is soft. There is no mass.     Tenderness: There is no abdominal tenderness. There is no right CVA tenderness, left CVA tenderness, guarding or rebound.     Hernia: No hernia is present.     Comments: Abdomen is obese.  No focal tenderness.  Soft and nondistended.  Normoactive bowel sounds.  Musculoskeletal:        General: Normal range of motion.     Cervical back: Normal range of motion and neck supple.     Right lower leg: No edema.     Left lower leg: No edema.  Skin:    General: Skin is warm.     Capillary Refill: Capillary refill takes less than 2 seconds.     Findings: No rash.  Neurological:     General: No focal deficit present.     Mental Status: She is alert.  Psychiatric:        Behavior: Behavior normal.     ED Results / Procedures / Treatments     Labs (all labs ordered are listed, but only abnormal results are displayed) Labs Reviewed  BASIC METABOLIC PANEL - Abnormal; Notable for the following components:      Result Value   Creatinine, Ser 1.12 (*)    GFR calc non Af Amer 54 (*)    All other components within normal limits  CBC - Abnormal; Notable for the following components:   Hemoglobin 11.7 (*)    All other components within normal limits  URINALYSIS, ROUTINE W REFLEX MICROSCOPIC - Abnormal; Notable for the following components:   APPearance HAZY (*)    Leukocytes,Ua SMALL (*)    Bacteria, UA MANY (*)    All other components within normal limits  URINE CULTURE  LIPASE, BLOOD  HEPATIC FUNCTION PANEL  BRAIN NATRIURETIC PEPTIDE  CK  I-STAT BETA HCG BLOOD, ED ([facility], [facility], [facility] ONLY)  TROPONIN I (HIGH SENSITIVITY)  TROPONIN I (HIGH SENSITIVITY)    EKG EKG Interpretation  Date/Time:  Wednesday October 04 2019 15:48:31 EDT Ventricular Rate:  76 PR Interval:  150 QRS Duration: 100 QT Interval:  430 QTC Calculation: 483 R Axis:   -32 Text Interpretation: Normal sinus rhythm Left axis deviation Cannot rule out Anterior infarct , age undetermined Abnormal ECG Confirmed by Ripley Fraise [phone]) on 10/04/2019 11:16:01 PM   Radiology DG Chest 2 View  Result Date: 10/04/2019 CLINICAL DATA:  Chest tightness for 1 week EXAM: CHEST - 2 VIEW COMPARISON:  09/16/2018 FINDINGS: Frontal and lateral views of the chest demonstrate a stable cardiac silhouette. There is chronic central vascular congestion and interstitial prominence, without airspace disease, effusion, or pneumothorax. No acute bony abnormalities. IMPRESSION: 1. Stable chest, no acute process. Electronically Signed  By: Sharlet Salina M.D.   On: 10/04/2019 16:11    Procedures Procedures (including critical care time)  Medications Ordered in ED Medications  sodium chloride flush (NS) 0.9 % injection 3 mL (has no administration in time range)  dicyclomine (BENTYL)  capsule 10 mg (10 mg Oral Given 10/05/19 0019)    ED Course  I have reviewed the triage vital signs and the nursing notes.  Pertinent labs & imaging results that were available during my care of the patient were reviewed by me and considered in my medical decision making (see chart for details).    MDM Rules/Calculators/A&P                      59 year old female with a HIV (viral load undetectable), HTN, duodenal ulcer secondary to NSAID use who presents with "gassy" of a gastric pain that began approximately 24 hours after receiving her first Covid vaccine.  Sx have been constant for the last 3 days.  No other associated GI symptoms.  She did have improvement in her pain in the ER with voiding.  Will check UA.  She also denies infectious symptoms.  She chronically has shortness of breath and leg swelling, but symptoms have not acutely worsened.  She did have one episode of lightheadedness several days ago, but no recurrent episodes.  Mildly hypertensive in the ER.  Vital signs are otherwise normal.  Chest x-ray demonstrating some chronic central vascular congestion and interstitial prominence.  However, lungs are clear to auscultation bilaterally.  No lower extremity edema.  BNP is not elevated.  Doubt congestive heart failure, pneumonia, tension pneumothorax.  EKG with normal sinus rhythm.  Delta troponins are not elevated.  Doubt ACS.  Doubt PE or aortic dissection.  Given epigastric pain, will add on lipase and hepatic function panel to ensure she is not having elevated transaminases due to [facility] versus cholecystitis or pancreatitis.  Hepatic function panel and lipase are within normal limits.  She was given Bentyl and on reevaluation her pain had resolved.  UA with bacteria and small leukocytes.  Will send for culture.  She has a penicillin allergy so we will start her on clindamycin since she is symptomatic.  She has close follow-up with infectious disease and primary care.  Advised to  follow-up with primary care if symptoms persist.  ER return precautions given.  She is hemodynamically stable and in no distress.  Safe discharge home with outpatient follow-up as needed.   Final Clinical Impression(s) / ED Diagnoses Final diagnoses:  Epigastric pain  Muscle cramps  Acute cystitis without hematuria    Rx / DC Orders ED Discharge Orders         Ordered    dicyclomine (BENTYL) 20 MG tablet  2 times daily,   Status:  Discontinued     10/05/19 0206    ciprofloxacin (CIPRO) 250 MG tablet  Every 12 hours,   Status:  Discontinued     10/05/19 0206    ciprofloxacin (CIPRO) 250 MG tablet  Every 12 hours     10/05/19 0207    dicyclomine (BENTYL) 20 MG tablet  2 times daily     10/05/19 0207           Lavender Stanke, Pedro Earls A, PA-C 10/05/19 [id number]    Shon Baton, MD 10/05/19 [phone]

## 2019-10-05 NOTE — Progress Notes (Signed)
Internal Medicine Clinic Attending  Case discussed with Dr. Seawell at the time of the visit.  We reviewed the resident's history and exam and pertinent patient test results.  I agree with the assessment, diagnosis, and plan of care documented in the resident's note.    

## 2019-10-05 NOTE — ED Notes (Signed)
Patient verbalizes understanding of discharge instructions. Opportunity for questioning and answers were provided. Armband removed by staff, pt discharged from ED. Pt. ambulatory and discharged home.  

## 2019-10-05 NOTE — Discharge Instructions (Addendum)
Thank you for allowing me to care for you today in the Emergency Department.   You were seen today in the emergency department for epigastric pain.  Your work-up for your heart was reassuring.  You were given a dose of Bentyl with significant improvement in her symptoms.  Take 1 tablet of Bentyl up to 2 times daily as well with epigastric pain.  If your symptoms do not improve within the next week, please follow-up with primary care.  Your urine was also concerning for a urinary tract infection.  Take 1 tablet of ciprofloxacin 2 times daily for the next 3 days.  Your urine has been sent to the culture for lab.  If symptoms persist after finishing the course of antibiotics, please follow-up with primary care.  Return to the emergency department if you develop respiratory distress, uncontrollable vomiting, if you pass out, or develop other new, concerning symptoms.

## 2019-10-07 LAB — URINE CULTURE: Culture: >=100000 — AB

## 2019-10-08 ENCOUNTER — Telehealth: Payer: Self-pay | Admitting: Emergency Medicine

## 2019-10-08 NOTE — Telephone Encounter (Signed)
Post ED Visit - Positive Culture Follow-up  Culture report reviewed by antimicrobial stewardship pharmacist: [facility] []  Nathan Batchelder, Pharm.D. []  , Pharm.D., BCPS AQ-ID []  , Pharm.D., BCPS []  Celedonio Miyamoto, Pharm.D., BCPS []  [place], Garvin Fila.D., BCPS, AAHIVP []  , Pharm.D., BCPS, AAHIVP []  Georgina Pillion, PharmD, BCPS []  , PharmD, BCPS []  [place], PharmD, BCPS [x]  [address], PharmD []  , PharmD, BCPS []  Estella Husk, PharmD  Pharmacy Team []  Lysle Pearl, PharmD []  , PharmD []  Phillips Climes, PharmD []  , Rph []  Agapito Games) , PharmD []  Joaquim Lai, PharmD []  , PharmD []  Mervyn Gay, PharmD []  , PharmD []  Vinnie Level, PharmD []  [facility], PharmD []  , PharmD []  Len Childs, PharmD   Positive urine culture Treated with Ciprofloxacin, organism sensitive to the same and no further patient follow-up is required at this time.  Namrata Dangler 10/08/2019, 2:46 PM

## 2019-10-09 ENCOUNTER — Ambulatory Visit: Payer: Medicare Other

## 2019-10-18 ENCOUNTER — Encounter: Payer: Self-pay | Admitting: Internal Medicine

## 2019-10-18 ENCOUNTER — Ambulatory Visit (INDEPENDENT_AMBULATORY_CARE_PROVIDER_SITE_OTHER): Payer: Medicare Other | Admitting: Internal Medicine

## 2019-10-18 VITALS — BP 141/79 | HR 80 | Temp 98.2°F | Ht 65.0 in | Wt 374.1 lb

## 2019-10-18 DIAGNOSIS — R1013 Epigastric pain: Secondary | ICD-10-CM

## 2019-10-18 DIAGNOSIS — I10 Essential (primary) hypertension: Secondary | ICD-10-CM

## 2019-10-18 DIAGNOSIS — R0789 Other chest pain: Secondary | ICD-10-CM

## 2019-10-18 DIAGNOSIS — Z6841 Body Mass Index (BMI) 40.0 and over, adult: Secondary | ICD-10-CM

## 2019-10-18 DIAGNOSIS — K219 Gastro-esophageal reflux disease without esophagitis: Secondary | ICD-10-CM

## 2019-10-18 DIAGNOSIS — Z79899 Other long term (current) drug therapy: Secondary | ICD-10-CM

## 2019-10-18 DIAGNOSIS — G4733 Obstructive sleep apnea (adult) (pediatric): Secondary | ICD-10-CM | POA: Diagnosis not present

## 2019-10-18 MED ORDER — LOSARTAN POTASSIUM 50 MG PO TABS: ORAL_TABLET | ORAL | 5 refills | Status: DC

## 2019-10-18 MED ORDER — OMEPRAZOLE 20 MG PO CPDR: 20.0000 mg | DELAYED_RELEASE_CAPSULE | Freq: Every day | ORAL | 0 refills | Status: DC

## 2019-10-18 NOTE — Assessment & Plan Note (Signed)
After previous ED visit she notes the bentyl really helped some of her chest pain. She does get a burning feeling after she eats. She does try to eat lighter food and eats about twice a day. She notes that she does eat a lot of fruit and uses them to make a lot of smoothies. Last colonoscopy was in 2015 in [place] and was normal, records are not available. She denies recent dark stools. She has had some lighter stools the last three weeks. LFTs on 3/24 showed normal AST?ALT and total bilirubin.  She thinks she may have had an ulcer before when her stool was really black in 2011 maybe. She has not had an EGD before.   - start omeprazole 20 mg qd  - refer to GI

## 2019-10-18 NOTE — Assessment & Plan Note (Signed)
Providing information to get in touch with [employer] to obtain the missing part to her CPAP machine. I believe a lot of her symptoms of SOB and chest tightness are likely related to her OSA as well. See chest tightness. She states she will call [employer] to get missing part.

## 2019-10-18 NOTE — Progress Notes (Signed)
   CC: chest tightness  HPI:  [name] is a 59 y.o. with PMH as below.   Please see A&P for assessment of the patient's acute and chronic medical conditions.   During last telephone visit she was having episodes of chest tightness with blood pressure elevated to 211/112 and LE swelling and dizzinesss so was sent to the ED. She did associate some of the chest tightness with anxiety but also randomly at rest. There, EKG at the time showed NSR. CXR and exam were negative for acute HF, labs were within normal limits. It does not look like she has been seen by cardiology in the past.   After previous ED visit she notes the bentyl really helped some of her chest pain. She does get a burning feeling after she eats. She does try to eat lighter food and eats about twice a day. She notes that she does eat a lot of fruit and uses them to make a lot of smoothies. Last colonoscopy was in 2015 in [place] and was normal, records are not available. She denies recent dark stools. She has had some lighter stools the last three weeks.  She really wants to lose weight. She feels she eats a healthy diet with lots of fruits and vegetables but continues to gain weight and is open to any advice and strategies she can get.    Past Medical History:  Diagnosis Date  . ATRIAL FIBRILLATION 08/20/2010   Qualifier: History of  By: Excell Seltzer CMA, Lawson Fiscal    . Chronic pain   . Duodenal ulcer Jan. 2012   Upper GI bleeding 2nd to NSAID use  . GERD (gastroesophageal reflux disease)   . HIV (human immunodeficiency virus infection) (HCC) 2018  . Hypertension   . Iron deficiency anemia   . OSA (obstructive sleep apnea)   . Plantar fascia syndrome   . RESTLESS LEG SYNDROME 08/20/2010       . Rhinitis   . Varicose vein of leg    Review of Systems:   10 point ROS negative except as noted in HPI  Physical Exam:   Constitution: NAD, appears stated age, morbidly obese Cardio: RRR, no m/r/g, no LE edema  Respiratory:  CTA, no w/r/r Abdominal: NTTP, soft, non-distended Neuro: normal affect, a&ox3 Skin: c/d/i    Vitals:   10/18/19 1347  BP: (!) 141/79  Pulse: 80  Temp: 98.2 F (36.8 C)  TempSrc: Oral  SpO2: 96%  Weight: (!) 374 lb 1.6 oz (169.7 kg)  Height: 5\' 5"  (1.651 m)     Assessment & Plan:   See Encounters Tab for problem based charting.  Patient discussed with Dr. 

## 2019-10-18 NOTE — Assessment & Plan Note (Signed)
She really wants to lose weight. She feels she eats a healthy diet with lots of fruits and vegetables but continues to gain weight and is open to any advice and strategies she can get.   - refer to Claudia Brady for weight management

## 2019-10-18 NOTE — Assessment & Plan Note (Signed)
During last telephone visit she was having episodes of chest tightness with blood pressure elevated to 211/112 and LE swelling and dizzinesss so was sent to the ED. She did associate some of the chest tightness with anxiety but also randomly at rest. There, EKG at the time showed NSR. CXR and exam were negative for acute HF, labs were within normal limits. It does not look like she has been seen by cardiology in the past.   - referral placed to cardiology for stress test  - she still has not called to get the part for her CPAP. Provided phone number information for new company [employer] and she states she will follow-up

## 2019-10-18 NOTE — Assessment & Plan Note (Signed)
BP Readings from Last 3 Encounters:  10/18/19 (!) 141/79  10/05/19 (!) 151/51  05/03/19 (!) 174/94   She is taking cozaar 50 mg qd and metoprolol 25 mg bid intermittently when she feels that her blood pressure is elevated. Today blood pressure improved.   - cont. Current medications  - f/u six months

## 2019-10-18 NOTE — Patient Instructions (Addendum)
Thank you for allowing [place] to provide your care today. Today we discussed chest tightness and reflux.    I have ordered the following labs for you:    I will call if any are abnormal.    Today we made the following changes to your medications:   Please START taking  Omeprazole 20 mg tablet - take one tablet daily prior to breakfast.    Please follow-up in 6 months for blood pressure check or if symptoms do not improve or you are unable to be seen by cardiology or the GI doctor.    Please contact [employer] to get your CPAP machine fixed.   Please call the internal medicine center clinic if you have any questions or concerns, we may be able to help and keep you from a long and expensive emergency room wait. Our clinic and after hours phone number is [phone], the best time to call is Monday through Friday 9 am to 4 pm but there is always someone available 24/7 if you have an emergency. If you need medication refills please notify your pharmacy one week in advance and they will send [place] a request.

## 2019-10-23 ENCOUNTER — Encounter: Payer: Self-pay | Admitting: Gastroenterology

## 2019-10-23 ENCOUNTER — Encounter: Payer: Self-pay | Admitting: Internal Medicine

## 2019-10-26 NOTE — Progress Notes (Signed)
Internal Medicine Clinic Attending  Case discussed with Dr. Seawell at the time of the visit.  We reviewed the resident's history and exam and pertinent patient test results.  I agree with the assessment, diagnosis, and plan of care documented in the resident's note.    

## 2019-10-26 NOTE — Addendum Note (Signed)
Addended by: Guinevere Scarlet A on: 10/26/2019 05:18 PM   Modules accepted: Orders

## 2019-10-31 ENCOUNTER — Telehealth: Payer: Self-pay

## 2019-10-31 DIAGNOSIS — G4733 Obstructive sleep apnea (adult) (pediatric): Secondary | ICD-10-CM

## 2019-10-31 NOTE — Telephone Encounter (Signed)
Requesting to speak with a nurse about getting CPAP machine, please call pt back.

## 2019-11-02 NOTE — Telephone Encounter (Signed)
Spoke with patient-last sleep study 2012. Will need new study to obtain new machine.  Will place order and send to MD for [facility]

## 2019-11-08 ENCOUNTER — Ambulatory Visit (INDEPENDENT_AMBULATORY_CARE_PROVIDER_SITE_OTHER): Payer: Medicare Other | Admitting: Dietician

## 2019-11-08 ENCOUNTER — Encounter: Payer: Self-pay | Admitting: Dietician

## 2019-11-08 DIAGNOSIS — Z713 Dietary counseling and surveillance: Secondary | ICD-10-CM | POA: Diagnosis not present

## 2019-11-08 DIAGNOSIS — I10 Essential (primary) hypertension: Secondary | ICD-10-CM | POA: Diagnosis not present

## 2019-11-08 NOTE — Patient Instructions (Signed)
Keep track of of your food and beverage intake for as many days as you can.  Please make a follow up in 2-3 weeks.  Lupita Leash [phone]

## 2019-11-09 NOTE — Progress Notes (Addendum)
Medical Nutrition Therapy:  Appt start time: 1525 end time:  1610. Total time: 45 Visit # 1  11/09/2019 Claudia Brady [id number]  Assessment:  Primary concerns today: weight loss to assist her with controlling hypertension Ms. Bottcher has been trying to make changes on her own for the past month to help herself stop her weight gain and reduce her  weight. Her efforts(quitting soda and starting back to exercising)  have not resulted in weight loss. She verbalizes some understanding of how to read a label, but lacks the knowledge, training and support to use the information to achieve her goals. She states that she gets extra money from her insurer to help purchase healthy foods which she enjoys. She also mentions that she has had a significant change in her living situation that caused her to tear up. She has an appointment schedule with the couselor and was encouraged to keep that appointment today. She mentions having supportive friends who exercise and food shop with her as well.  Preferred Learning Style: No preference indicated  Learning Readiness: Contemplating,Ready, Change in progress  ANTHROPOMETRICS: Estimated body mass index is 62.42 kg/m as calculated from the following:   Height as of 10/18/19: 5\' 5"  (1.651 m).   Weight as of this encounter: 375 lb 1.6 oz (170.1 kg).  Weight History: reports 60# weight gain soon after stating BIKTARVY and has continued to increase since then. Reports weight was stables ~ 230# until then.  SLEEP:broken by 1-2 hours of wakefullness between 1-4 AM. Having to use bathroom wakes her then she stays awake MEDICATIONS: BIKTARVY can cause weight gain,Ompeprazole can decrease absorption of iron and other nutrients  DIETARY INTAKE: Usual eating pattern includes 3 meals and 3 snacks per day. Everyday foods include loves vegetables and seafood, fruits, bread both whie and whole grain and rye, sweets and snacks  Avoided foods include  most fried foods except  fish about 2x/week.  Dining Out (times/week): 2x/month- would rather eat food from home 24-hr recall:  B ( 730AM): 1/2 a magic bullet smoothie coffee with sweet almond creamer nd sugar, boiled egg/banana/cheerios  Snk ( 1130 AM): 1/2 smoothie(fruit, spinach, blueberry, carrots, celery  L ( 330 PM): hamburger or half of a sandwich Dinner (630 PM): chicken, spaghetti with hamburger, uses onions, mushroom and peppers in most foods SNK ( 830PM): cookies, candy, wheat thins, crackers, cranberry juiice, chips M & Ms, trail mix. Crackers and cheese, yogurt candy Beverages: water, juice,   coffee  Usual physical activity: started back going to gym 1 month ago, goes    Progress Towards Goal(s):  In progress.   Nutritional Diagnosis:  NB-1.1 Food and nutrition-related knowledge deficit As related to lack of sufficient education about food and nutrition to achieve weight loss.  As evidenced by her report, reading labels and BMI of 62 .    Intervention:  Nutrition education about how weight loss would impact her hypertension how to achieve weight loss and how to keep a food record. . Action Goal:write down every ting you eat and drink, portion consumed and calories I that portion if able to find.   Outcome goal: improved nutrient status, weight decrease Coordination of care: consider weight loss medication if she is unable to achieve weight loss with lifestyle changes.   Teaching Method Utilized: Visual, Auditory,Hands on Handouts given during visit include:food records with instruction of how to use, booklet on how to achieve weight loss Barriers to learning/adherence to lifestyle change: competing values Demonstrated degree of understanding via:  Teach Back   Monitoring/Evaluation:  Dietary intake, exercise, food records and body weight in 2 week(s)-3 weeks Debera Lat, RD 11/09/2019 2:15 PM. .

## 2019-11-16 ENCOUNTER — Other Ambulatory Visit: Payer: Self-pay | Admitting: Internal Medicine

## 2019-11-16 DIAGNOSIS — F419 Anxiety disorder, unspecified: Secondary | ICD-10-CM

## 2019-11-20 ENCOUNTER — Other Ambulatory Visit (HOSPITAL_COMMUNITY)
Admission: RE | Admit: 2019-11-20 | Discharge: 2019-11-20 | Disposition: A | Discharge status: Home / Self Care | Payer: Medicare Other | Source: Ambulatory Visit | Attending: Internal Medicine | Admitting: Internal Medicine

## 2019-11-20 DIAGNOSIS — Z01812 Encounter for preprocedural laboratory examination: Secondary | ICD-10-CM | POA: Insufficient documentation

## 2019-11-20 DIAGNOSIS — Z20822 Contact with and (suspected) exposure to covid-19: Secondary | ICD-10-CM | POA: Insufficient documentation

## 2019-11-20 LAB — SARS CORONAVIRUS 2 (TAT 6-24 HRS): SARS Coronavirus 2: NEGATIVE

## 2019-11-21 ENCOUNTER — Ambulatory Visit: Payer: Medicare Other | Admitting: Gastroenterology

## 2019-11-22 ENCOUNTER — Ambulatory Visit (HOSPITAL_BASED_OUTPATIENT_CLINIC_OR_DEPARTMENT_OTHER): Payer: Medicare Other | Attending: Internal Medicine | Admitting: Internal Medicine

## 2019-11-22 ENCOUNTER — Encounter: Payer: Self-pay | Admitting: Licensed Clinical Social Worker

## 2019-11-22 ENCOUNTER — Other Ambulatory Visit: Payer: Self-pay

## 2019-11-22 ENCOUNTER — Ambulatory Visit (INDEPENDENT_AMBULATORY_CARE_PROVIDER_SITE_OTHER): Payer: Medicare Other | Admitting: Licensed Clinical Social Worker

## 2019-11-22 VITALS — Temp 97.7°F | Ht 65.0 in | Wt 375.0 lb

## 2019-11-22 DIAGNOSIS — F321 Major depressive disorder, single episode, moderate: Secondary | ICD-10-CM

## 2019-11-22 DIAGNOSIS — G4733 Obstructive sleep apnea (adult) (pediatric): Secondary | ICD-10-CM | POA: Insufficient documentation

## 2019-11-22 NOTE — BH Specialist Note (Signed)
Integrated Behavioral Health Visit via Telemedicine (Telephone)  11/22/2019 Claudia Brady [id number]   Session Start time: 12:00  Session End time: 12:30 Total time: 30 minutes  Referring Provider: Dr. Cleaster Corin Type of Visit: Telephonic Patient location: Office [facility] Provider location: Office All persons participating in visit: Patient and [name]  Confirmed patient's address: Yes  Confirmed patient's phone number: Yes  Any changes to demographics: No   Discussed confidentiality: Yes    The following statements were read to the patient and/or legal guardian that are established with the [facility] Provider.  "The purpose of this phone visit is to provide behavioral health care while limiting exposure to the coronavirus (COVID19).  There is a possibility of technology failure and discussed alternative modes of communication if that failure occurs."  "By engaging in this telephone visit, you consent to the provision of healthcare.  Additionally, you authorize for your insurance to be billed for the services provided during this telephone visit."   Patient and/or legal guardian consented to telephone visit: Yes   PRESENTING CONCERNS: Patient and/or family reports the following symptoms/concerns: depression, sleep disturbances, issues with weight gain Duration of problem: around three years; Severity of problem: moderate  GOALS ADDRESSED: Patient will: 1.  Reduce symptoms of: depression, insomnia and stress  2.  Increase knowledge and/or ability of: coping skills, healthy habits and stress reduction  3.  Demonstrate ability to: Increase healthy adjustment to current life circumstances, Increase adequate support systems for patient/family and Increase motivation to adhere to plan of care  INTERVENTIONS: Interventions utilized:  Behavioral Activation, Supportive Counseling and Sleep Hygiene Standardized Assessments completed: assessed for SI, HI, and  self-harm.  ASSESSMENT: Patient currently experiencing moderate levels of depression. Patient reported that her weight gain is a trigger for her depression and poor self-esteem. Patient lacks motivation to engage in activities or goals she wants to integrate into her life. Patient is unsure if her weight gain is stemming from her medication. Patient is hopeful to increase her motivation to increase exercise and eating habits.   Patient may benefit from counseling and continuing to work with Lupita Leash.  PLAN: 1. Follow up with behavioral health clinician on : two weeks.   Lysle Rubens, [facility], LCAS

## 2019-11-26 NOTE — Procedures (Signed)
Patient Name: Claudia, Brady Date: 11/22/2019 Gender: Female D.O.B: 19-Nov-1960 Age (years): 46 Referring Provider: Nischal Narendra Height (inches): 65 Interpreting Physician: Baird Lyons MD, ABSM Weight (lbs): 375 RPSGT: Carolin Coy BMI: 62 MRN: [id number] Neck Size: 16.50  CLINICAL INFORMATION The patient is referred for a split night study with BPAP. MEDICATIONS Medications self-administered by patient taken the night of the study : none reported  SLEEP STUDY TECHNIQUE As per the AASM Manual for the Scoring of Sleep and Associated Events v2.3 (April 2016) with a hypopnea requiring 4% desaturations.  The channels recorded and monitored were frontal, central and occipital EEG, electrooculogram (EOG), submentalis EMG (chin), nasal and oral airflow, thoracic and abdominal wall motion, anterior tibialis EMG, snore microphone, electrocardiogram, and pulse oximetry. Bi-level positive airway pressure (BiPAP) was initiated when the patient met split night criteria and was titrated according to treat sleep-disordered breathing.  RESPIRATORY PARAMETERS Diagnostic  Total AHI (/hr): 143.0 RDI (/hr): 145.0 OA Index (/hr): 99.5 CA Index (/hr): 0.0 REM AHI (/hr): 93.3 NREM AHI (/hr): 144.9 Supine AHI (/hr): 138.4 Non-supine AHI (/hr): 154.29 Min O2 Sat (%): 76.0 Mean O2 (%): 90.0 Time below 88% (min): 33.8   Titration  Optimal IPAP Pressure (cm): 26 Optimal EPAP Pressure (cm): 22 AHI at Optimal Pressure (/hr): 19.7 Min O2 at Optimal Pressure (%): 92.0 Sleep % at Optimal (%): 92 Supine % at Optimal (%): 0     SLEEP ARCHITECTURE The study was initiated at 9:51:22 PM and terminated at 5:00:16 AM. The total recorded time was 428.9 minutes. EEG confirmed total sleep time was 345.5 minutes yielding a sleep efficiency of 80.6%%. Sleep onset after lights out was 20.7 minutes with a REM latency of 34.0 minutes. The patient spent 9.4%% of the night in stage N1 sleep, 63.5%% in  stage N2 sleep, 3.3%% in stage N3 and 23.7% in REM. Wake after sleep onset (WASO) was 62.7 minutes. The Arousal Index was 37.0/hour.  LEG MOVEMENT DATA The total Periodic Limb Movements of Sleep (PLMS) were 0. The PLMS index was 0.0 .  CARDIAC DATA The 2 lead EKG demonstrated sinus rhythm. The mean heart rate was 100.0 beats per minute. Other EKG findings include: PVCs.  IMPRESSIONS - Severe obstructive sleep apnea occurred during the diagnostic portion of the study (AHI = 143.0 /hour). - CPAP titration did not provide adequate control at tolerated pressure and was changed to BIPAPtitration.An optimal BIPAP pressure was selected for this patient ( 26 / 22 cm of water). - No significant central sleep apnea occurred during the diagnostic portion of the study (CAI = 0.0/hour). - Oxygen desaturation was noted during the diagnostic portion of the study (Min O2 = 76.0%). Minimum saturationon BIPAP 26/22 was 92%. - The patient snored with loud snoring volume during the diagnostic portion of the study. - EKG findings include PVCs. - Clinically significant periodic limb movements of sleep did not occur during the study.  DIAGNOSIS - Obstructive Sleep Apnea (327.23 [G47.33 ICD-10])  RECOMMENDATIONS - Trial of BiPAP therapy on 26/22 cm H2O.. - Patient used a Wide size Fisher&Paykel Nasal Mask Evora (Nasal) Mask, large chin strap and heated humidification. - Be careful with alcohol, sedatives and other CNS depressants that may worsen sleep apnea and disrupt normal sleep architecture. - Sleep hygiene should be reviewed to assess factors that may improve sleep quality. - Weight management and regular exercise should be initiated or continued.  [Electronically signed] 11/26/2019 01:05 PM  Baird Lyons MD, ABSM Diplomate, American Board of Sleep  Medicine   NPI: [id number]                         [place], American Board of Sleep Medicine  ELECTRONICALLY  SIGNED ON:  11/26/2019, 1:01 PM [place] PH: [phone]   FX: [phone] [place]

## 2019-12-04 ENCOUNTER — Ambulatory Visit: Payer: Medicare Other

## 2020-01-03 ENCOUNTER — Ambulatory Visit: Payer: Medicare Other | Admitting: Gastroenterology

## 2020-01-03 NOTE — Addendum Note (Signed)
Addended by: Neomia Dear on: 01/03/2020 05:43 PM   Modules accepted: Orders

## 2020-01-11 ENCOUNTER — Other Ambulatory Visit: Payer: Self-pay

## 2020-01-11 DIAGNOSIS — B20 Human immunodeficiency virus [HIV] disease: Secondary | ICD-10-CM

## 2020-01-17 ENCOUNTER — Other Ambulatory Visit: Payer: Medicare Other

## 2020-01-17 ENCOUNTER — Other Ambulatory Visit: Payer: Self-pay

## 2020-01-17 DIAGNOSIS — B20 Human immunodeficiency virus [HIV] disease: Secondary | ICD-10-CM

## 2020-01-18 LAB — T-HELPER CELL (CD4) - (RCID CLINIC ONLY)
CD4 % Helper T Cell: 46 % (ref 33–65)
CD4 T Cell Abs: 834 /uL (ref 400–1790)

## 2020-01-22 ENCOUNTER — Other Ambulatory Visit: Payer: Self-pay

## 2020-01-22 ENCOUNTER — Encounter: Payer: Self-pay | Admitting: Internal Medicine

## 2020-01-22 ENCOUNTER — Ambulatory Visit (INDEPENDENT_AMBULATORY_CARE_PROVIDER_SITE_OTHER): Payer: Medicare Other | Admitting: Internal Medicine

## 2020-01-22 VITALS — BP 158/92 | HR 79 | Wt 376.6 lb

## 2020-01-22 DIAGNOSIS — R635 Abnormal weight gain: Secondary | ICD-10-CM | POA: Diagnosis not present

## 2020-01-22 DIAGNOSIS — B20 Human immunodeficiency virus [HIV] disease: Secondary | ICD-10-CM

## 2020-01-22 DIAGNOSIS — Z0000 Encounter for general adult medical examination without abnormal findings: Secondary | ICD-10-CM | POA: Diagnosis not present

## 2020-01-22 DIAGNOSIS — I10 Essential (primary) hypertension: Secondary | ICD-10-CM

## 2020-01-22 LAB — COMPLETE METABOLIC PANEL WITH GFR
AG Ratio: 1.2 (calc) (ref 1.0–2.5)
ALT: 15 U/L (ref 6–29)
AST: 22 U/L (ref 10–35)
Albumin: 3.9 g/dL (ref 3.6–5.1)
Alkaline phosphatase (APISO): 84 U/L (ref 37–153)
BUN: 11 mg/dL (ref 7–25)
CO2: 30 mmol/L (ref 20–32)
Calcium: 8.9 mg/dL (ref 8.6–10.4)
Chloride: 103 mmol/L (ref 98–110)
Creat: 0.84 mg/dL (ref 0.50–1.05)
GFR, Est African American: 89 mL/min/{1.73_m2} (ref >=60)
GFR, Est Non African American: 77 mL/min/{1.73_m2} (ref >=60)
Globulin: 3.3 g/dL (calc) (ref 1.9–3.7)
Glucose, Bld: 82 mg/dL (ref 65–99)
Potassium: 4.8 mmol/L (ref 3.5–5.3)
Sodium: 139 mmol/L (ref 135–146)
Total Bilirubin: 0.4 mg/dL (ref 0.2–1.2)
Total Protein: 7.2 g/dL (ref 6.1–8.1)

## 2020-01-22 LAB — CBC WITH DIFFERENTIAL/PLATELET
Absolute Monocytes: 622 cells/uL (ref 200–950)
Basophils Absolute: 39 cells/uL (ref 0–200)
Basophils Relative: 0.7 %
Eosinophils Absolute: 213 cells/uL (ref 15–500)
Eosinophils Relative: 3.8 %
HCT: 35.3 % (ref 35.0–45.0)
Hemoglobin: 11.5 g/dL — ABNORMAL LOW (ref 11.7–15.5)
Lymphs Abs: 1792 cells/uL (ref 850–3900)
MCH: 28.4 pg (ref 27.0–33.0)
MCHC: 32.6 g/dL (ref 32.0–36.0)
MCV: 87.2 fL (ref 80.0–100.0)
MPV: 10.6 fL (ref 7.5–12.5)
Monocytes Relative: 11.1 %
Neutro Abs: 2934 cells/uL (ref 1500–7800)
Neutrophils Relative %: 52.4 %
Platelets: 278 10*3/uL (ref 140–400)
RBC: 4.05 10*6/uL (ref 3.80–5.10)
RDW: 14.4 % (ref 11.0–15.0)
Total Lymphocyte: 32 %
WBC: 5.6 10*3/uL (ref 3.8–10.8)

## 2020-01-22 LAB — HIV-1 RNA QUANT-NO REFLEX-BLD
HIV 1 RNA Quant: 103 copies/mL — ABNORMAL HIGH
HIV-1 RNA Quant, Log: 2.01 Log copies/mL — ABNORMAL HIGH

## 2020-01-22 NOTE — Progress Notes (Signed)
Patient ID: Claudia Brady, female   DOB: [birth year], 59 y.o.   MRN: [id number]  HPI 59yo F with HIV disease, hx of headaches, obesity, htn, anxiety -  Held up for 6 months kept inside her appt due to covid anxiety.  Knew people who had covid. Had a sisters - who had it and survived  covid -19 vaccine + => #1 pfizer, [id number] lot#, 09/30/19, #2 pfizer, [phone], on 10/21/19  Off of medicine for 1 month concern for 100lb weight gain over 3 years and feels certain it is due to HIV regimen.   Outpatient Encounter Medications as of 01/22/2020  Medication Sig  . DULoxetine (CYMBALTA) 30 MG capsule Take 2 tablets per day (Patient not taking: Reported on 01/22/2020)  . Multiple Vitamin (MULTIVITAMIN WITH MINERALS) TABS tablet Take 1 tablet by mouth daily.   Marland Kitchen BIKTARVY 50-200-25 MG TABS tablet TAKE 1 TABLET BY MOUTH DAILY (Patient not taking: Reported on 01/22/2020)  . dicyclomine (BENTYL) 20 MG tablet Take 1 tablet (20 mg total) by mouth 2 (two) times daily.  Marland Kitchen losartan (COZAAR) 50 MG tablet TAKE 1 TABLET(50 MG) BY MOUTH DAILY (Patient not taking: Reported on 01/22/2020)  . metoprolol tartrate (LOPRESSOR) 25 MG tablet TAKE 1 TABLET(25 MG) BY MOUTH TWICE DAILY (Patient not taking: Reported on 01/22/2020)  . omeprazole (PRILOSEC) 20 MG capsule Take 1 capsule (20 mg total) by mouth daily. (Patient not taking: Reported on 01/22/2020)   No facility-administered encounter medications on file as of 01/22/2020.     Patient Active Problem List   Diagnosis Date Noted  . Epigastric pain 10/18/2019  . Chest tightness 10/04/2019  . Hemorrhoid 04/05/2019  . Healthcare maintenance 04/05/2019  . Migraine 09/17/2018  . Left upper quadrant pain 09/17/2018  . Venous insufficiency 09/16/2018  . Abnormal uterine bleeding (AUB) 02/04/2018  . Morbid obesity (HCC) 04/19/2017  . 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 HTN (hypertension) 10/23/2016  . Human immunodeficiency virus I infection (HCC) 10/02/2016  . Poor  dentition 10/02/2016  . Depression 11/28/2010  . Obstructive sleep apnea 08/20/2010     Health Maintenance Due  Topic Date Due  . COVID-19 Vaccine (1) Never done  . PAP SMEAR-Modifier  02/05/2019  . MAMMOGRAM  05/21/2019     Review of Systems 12 point ros is negative except  For anxiety, weight gain, and what is mentioned in hpi Physical Exam   BP (!) 158/92   Pulse 79   Wt (!) 376 lb 9.6 oz (170.8 kg)   SpO2 93%   BMI 62.67 kg/m   Physical Exam  Constitutional:  oriented to person, place, and time. appears well-developed and well-nourished. No distress.  HENT: [place]/AT, PERRLA, no scleral icterus Mouth/Throat: Oropharynx is clear and moist. No oropharyngeal exudate.  Cardiovascular: Normal rate, regular rhythm and normal heart sounds. Exam reveals no gallop and no friction rub.  No murmur heard.  Pulmonary/Chest: Effort normal and breath sounds normal. No respiratory distress.  has no wheezes.  Neck = supple, no nuchal rigidity Abdominal: Soft. Bowel sounds are normal.  exhibits no distension. There is no tenderness.  Lymphadenopathy: no cervical adenopathy. No axillary adenopathy Neurological: alert and oriented to person, place, and time.  Skin: Skin is warm and dry. No rash noted. No erythema.  Psychiatric: a normal mood and affect.  behavior is normal.   Lab Results  Component Value Date   CD4TCELL 46 01/17/2020   Lab Results  Component Value Date   CD4TABS 834 01/17/2020   CD4TABS 776  05/04/2019   CD4TABS 730 07/18/2018   Lab Results  Component Value Date   HIV1RNAQUANT <20 NOT DETECTED 05/04/2019   Lab Results  Component Value Date   HEPBSAB NEG 10/02/2016   Lab Results  Component Value Date   LABRPR NON-REACTIVE 07/18/2018    CBC Lab Results  Component Value Date   WBC 5.6 01/17/2020   RBC 4.05 01/17/2020   HGB 11.5 (L) 01/17/2020   HCT 35.3 01/17/2020   PLT 278 01/17/2020   MCV 87.2 01/17/2020   MCH 28.4 01/17/2020   MCHC 32.6 01/17/2020    RDW 14.4 01/17/2020   LYMPHSABS 1,792 01/17/2020   MONOABS 0.5 03/17/2014   EOSABS 213 01/17/2020    BMET Lab Results  Component Value Date   NA 139 01/17/2020   K 4.8 01/17/2020   CL 103 01/17/2020   CO2 30 01/17/2020   GLUCOSE 82 01/17/2020   BUN 11 01/17/2020   CREATININE 0.84 01/17/2020   CALCIUM 8.9 01/17/2020   GFRNONAA 77 01/17/2020   GFRAA 89 01/17/2020     Assessment and Plan  Weight gain = Thyroid function; consider referral to weight gain programs  htn = start back on losartan alone ( was light headed with metoprolol)  hiv disease = will start symtuza, and also get genotype  Health maintenance= needs mammo, overdue! By 2.12yrs.

## 2020-01-25 ENCOUNTER — Encounter: Payer: Self-pay | Admitting: Internal Medicine

## 2020-01-25 ENCOUNTER — Ambulatory Visit (INDEPENDENT_AMBULATORY_CARE_PROVIDER_SITE_OTHER): Payer: Medicare Other | Admitting: Internal Medicine

## 2020-01-25 ENCOUNTER — Other Ambulatory Visit: Payer: Self-pay

## 2020-01-25 VITALS — BP 180/117 | HR 83 | Temp 98.1°F | Ht 65.0 in | Wt 377.3 lb

## 2020-01-25 DIAGNOSIS — G479 Sleep disorder, unspecified: Secondary | ICD-10-CM | POA: Diagnosis not present

## 2020-01-25 DIAGNOSIS — F321 Major depressive disorder, single episode, moderate: Secondary | ICD-10-CM | POA: Diagnosis not present

## 2020-01-25 NOTE — Progress Notes (Signed)
CC: Depression  HPI:  [name] is a 59 y.o. female with a past medical history stated below and presents today for depression. Please see problem based assessment and plan for additional details.  Past Medical History:  Diagnosis Date  . ATRIAL FIBRILLATION 08/20/2010   Qualifier: History of  By: Excell Seltzer CMA, Lawson Fiscal    . Chronic pain   . Duodenal ulcer Jan. 2012   Upper GI bleeding 2nd to NSAID use  . GERD (gastroesophageal reflux disease)   . HIV (human immunodeficiency virus infection) (HCC) 2018  . Hypertension   . Iron deficiency anemia   . OSA (obstructive sleep apnea)   . Plantar fascia syndrome   . RESTLESS LEG SYNDROME 08/20/2010       . Rhinitis   . Varicose vein of leg     Current Outpatient Medications on File Prior to Visit  Medication Sig Dispense Refill  . DULoxetine (CYMBALTA) 30 MG capsule Take 2 tablets per day (Patient not taking: Reported on 01/22/2020) 180 capsule 1  . dicyclomine (BENTYL) 20 MG tablet Take 1 tablet (20 mg total) by mouth 2 (two) times daily. 20 tablet 0  . losartan (COZAAR) 50 MG tablet TAKE 1 TABLET(50 MG) BY MOUTH DAILY (Patient not taking: Reported on 01/22/2020) 30 tablet 5  . metoprolol tartrate (LOPRESSOR) 25 MG tablet TAKE 1 TABLET(25 MG) BY MOUTH TWICE DAILY (Patient not taking: Reported on 01/22/2020) 60 tablet 2  . Multiple Vitamin (MULTIVITAMIN WITH MINERALS) TABS tablet Take 1 tablet by mouth daily.     Marland Kitchen omeprazole (PRILOSEC) 20 MG capsule Take 1 capsule (20 mg total) by mouth daily. (Patient not taking: Reported on 01/22/2020) 30 capsule 0   No current facility-administered medications on file prior to visit.    Family History  Problem Relation Age of Onset  . Sleep apnea Other   . Diabetes Other   . Hypertension Other   . Asthma Other   . Emphysema Other   . Allergies Other     Social History   Socioeconomic History  . Marital status: Single    Spouse name: Not on file  . Number of children: Not on file  .  Years of education: Not on file  . Highest education level: Not on file  Occupational History  . Not on file  Tobacco Use  . Smoking status: Former Smoker    Years: 15.00    Types: Cigarettes    Quit date: 12/22/2015    Years since quitting: 4.0  . Smokeless tobacco: Never Used  Vaping Use  . Vaping Use: Former  Substance and Sexual Activity  . Alcohol use: Yes    Comment: occ  . Drug use: No  . Sexual activity: Never  Other Topics Concern  . Not on file  Social History Narrative  . Not on file   Social Determinants of Health   Financial Resource Strain:   . Difficulty of Paying Living Expenses:   Food Insecurity:   . Worried About Programme researcher, broadcasting/film/video in the Last Year:   . Barista in the Last Year:   Transportation Needs:   . Freight forwarder (Medical):   Marland Kitchen Lack of Transportation (Non-Medical):   Physical Activity:   . Days of Exercise per Week:   . Minutes of Exercise per Session:   Stress:   . Feeling of Stress :   Social Connections:   . Frequency of Communication with Friends and Family:   . Frequency  of Social Gatherings with Friends and Family:   . Attends Religious Services:   . Active Member of Clubs or Organizations:   . Attends Banker Meetings:   Marland Kitchen Marital Status:   Intimate Partner Violence:   . Fear of Current or Ex-Partner:   . Emotionally Abused:   Marland Kitchen Physically Abused:   . Sexually Abused:     Review of Systems: ROS negative except for what is noted on the assessment and plan.  Vitals:   01/25/20 1427  BP: (!) 180/117  Pulse: 83  Temp: 98.1 F (36.7 C)  TempSrc: Oral  SpO2: 98%  Weight: (!) 377 lb 4.8 oz (171.1 kg)  Height: 5\' 5"  (1.651 m)     Physical Exam: Physical Exam Constitutional:      Appearance: Normal appearance. She is obese.  HENT:     Head: Normocephalic and atraumatic.  Cardiovascular:     Rate and Rhythm: Normal rate.     Pulses: Normal pulses.     Heart sounds: Normal heart sounds.    Pulmonary:     Effort: Pulmonary effort is normal.     Breath sounds: Normal breath sounds.  Skin:    General: Skin is warm and dry.  Neurological:     Mental Status: She is alert. Mental status is at baseline.  Psychiatric:        Mood and Affect: Mood is depressed. Affect is tearful.        Speech: Speech normal.        Behavior: Behavior normal.        Thought Content: Thought content normal.        Cognition and Memory: Cognition normal.        Judgment: Judgment normal.      Assessment & Plan:   See Encounters Tab for problem based charting.  Patient discussed with Dr. , D.O. [facility], PGY-1 Pager: [phone], Phone: [phone] Date 01/25/2020 Time 3:29 PM

## 2020-01-25 NOTE — Patient Instructions (Addendum)
Thank you, Ms.Denajah Farias for allowing [place] to provide your care today. Today we discussed Anxiety and Sleep.    No referrals made today, but make sure to keep your follow up with Lysle Rubens.  I have ordered the following tests: none today   I have ordered the following medication/changed the following medications:    Please follow-up as needed  Should you have any questions or concerns please call the internal medicine clinic at [phone].    Dellia Cloud, D.O. [facility]   My Chart Access: [email or web address]   If you have not already done so, please get your COVID 19 vaccine  To schedule an appointment for a COVID vaccine choice any of the following: Go to [email or web address]   Go to [email or web address]                  Call [phone]                                     Call [phone] and select Option 2    Practice Good Sleep Hygiene Here are some suggestions Avoid napping during the day. It can disturb the normal pattern of sleep and wakefulness.  Avoid stimulants such as caffeine, nicotine, and alcohol too close to bedtime. While alcohol is well known to speed the onset of sleep, it disrupts sleep in the second half as the body begins to metabolize the alcohol, causing arousal.  Exercise can promote good sleep. Vigorous exercise should be taken in the morning or late afternoon. A relaxing exercise, like yoga, can be done before bed to help initiate a restful night's sleep. Food can be disruptive right before sleep. Stay away from large meals close to bedtime. Also dietary changes can cause sleep problems, if someone is struggling with a sleep problem, it's not a good time to start experimenting with spicy dishes. And, remember, chocolate has caffeine.  Ensure adequate exposure to natural light. This is particularly important for older people who may not venture outside as frequently as  children and adults. Light exposure helps maintain a healthy sleep-wake cycle.  Establish a regular relaxing bedtime routine. Try to avoid emotionally upsetting conversations and activities before trying to go to sleep. Don't dwell on, or bring your problems to bed.  Associate your bed with sleep. It's not a good idea to use your bed to watch TV, listen to the radio, or read.  Make sure that the sleep environment is pleasant and relaxing. The bed should be comfortable, the room should not be too hot or cold, or too bright.

## 2020-01-26 ENCOUNTER — Encounter: Payer: Self-pay | Admitting: Internal Medicine

## 2020-01-26 NOTE — Assessment & Plan Note (Signed)
Patient presents with difficulty sleeping. She states that despite feeling fatigued during the day she is unable to get to sleep at night. She admits to worsening depression over the last year as COVID has made her more isolated due to fear of infection. She states that she has gained a lot of weight in the last year and the combination of her obesity and depression has made activities that she previously enjoyed more difficult. Her weight is a significant stressor and feels desperate to loss the weight.   Patient set up a f/u appointment with Phineas Semen for counseling and admits to recently restarting all of her medications , including duloxetine. She would like benefit from titrating this medication up in the next month.  I counseled her regarding sleep hygiene as means of improving her sleep difficulties and gave her additional information to take home with her. Pateitn agrees to trying sleep hygiene prior to starting another medication at this time.   Plan: - Counseled on sleep hygiene - Encouraged medication adherence - Set up f/u appointment with Phineas Semen for counseling.

## 2020-01-29 ENCOUNTER — Telehealth: Payer: Self-pay

## 2020-01-30 ENCOUNTER — Ambulatory Visit (INDEPENDENT_AMBULATORY_CARE_PROVIDER_SITE_OTHER): Payer: Medicare Other | Admitting: Internal Medicine

## 2020-01-30 ENCOUNTER — Other Ambulatory Visit: Payer: Self-pay

## 2020-01-30 ENCOUNTER — Encounter: Payer: Self-pay | Admitting: Internal Medicine

## 2020-01-30 DIAGNOSIS — M25511 Pain in right shoulder: Secondary | ICD-10-CM

## 2020-01-30 DIAGNOSIS — S4991XA Unspecified injury of right shoulder and upper arm, initial encounter: Secondary | ICD-10-CM

## 2020-01-30 NOTE — Patient Instructions (Signed)
Thank you, Ms.Keryl Gholson for allowing [place] to provide your care today. Today we discussed shoulder pain.    I have ordered the following labs for you:  Lab Orders  No laboratory test(s) ordered today     I will call if any are abnormal. All of your labs can be accessed through "My Chart".  I have place a referrals to Occupational therapy.  I have ordered the following tests: none   I have ordered the following medication/changed the following medications:  1. begin Naproxen 500 mg twice daily with food for 5 days 2. begin Heating/cool compresses sevearl times per day  Please follow-up as needed  Should you have any questions or concerns please call the internal medicine clinic at [phone].    Dellia Cloud, D.O. [facility]   My Chart Access: [email or web address]   If you have not already done so, please get your COVID 19 vaccine  To schedule an appointment for a COVID vaccine choice any of the following: Go to [email or web address]   Go to [email or web address]                  Call [phone]                                     Call [phone] and select Option 2

## 2020-01-31 ENCOUNTER — Encounter: Payer: Self-pay | Admitting: Internal Medicine

## 2020-01-31 DIAGNOSIS — S4990XA Unspecified injury of shoulder and upper arm, unspecified arm, initial encounter: Secondary | ICD-10-CM

## 2020-01-31 HISTORY — DX: Unspecified injury of shoulder and upper arm, unspecified arm, initial encounter: S49.90XA

## 2020-01-31 MED ORDER — NAPROXEN 500 MG PO TABS: 500.0000 mg | ORAL_TABLET | Freq: Two times a day (BID) | ORAL | 0 refills | Status: AC

## 2020-01-31 NOTE — Progress Notes (Signed)
Internal Medicine Clinic Attending  Case discussed with Dr. Coe  At the time of the visit.  We reviewed the resident's history and exam and pertinent patient test results.  I agree with the assessment, diagnosis, and plan of care documented in the resident's note.  

## 2020-01-31 NOTE — Progress Notes (Signed)
CC: Shoulder pain  HPI:  Claudia Brady is a 59 y.o. female with a past medical history stated below and presents today for Shoulder pain. Please see problem based assessment and plan for additional details.  Past Medical History:  Diagnosis Date  . ATRIAL FIBRILLATION 08/20/2010   Qualifier: History of  By: Excell Seltzer CMA, Lawson Fiscal    . Chronic pain   . Duodenal ulcer Jan. 2012   Upper GI bleeding 2nd to NSAID use  . GERD (gastroesophageal reflux disease)   . HIV (human immunodeficiency virus infection) (HCC) 2018  . Hypertension   . Iron deficiency anemia   . OSA (obstructive sleep apnea)   . Plantar fascia syndrome   . RESTLESS LEG SYNDROME 08/20/2010       . Rhinitis   . Varicose vein of leg     Current Outpatient Medications on File Prior to Visit  Medication Sig Dispense Refill  . DULoxetine (CYMBALTA) 30 MG capsule Take 2 tablets per day (Patient not taking: Reported on 01/22/2020) 180 capsule 1  . losartan (COZAAR) 50 MG tablet TAKE 1 TABLET(50 MG) BY MOUTH DAILY (Patient not taking: Reported on 01/22/2020) 30 tablet 5  . metoprolol tartrate (LOPRESSOR) 25 MG tablet TAKE 1 TABLET(25 MG) BY MOUTH TWICE DAILY (Patient not taking: Reported on 01/22/2020) 60 tablet 2  . Multiple Vitamin (MULTIVITAMIN WITH MINERALS) TABS tablet Take 1 tablet by mouth daily.     Marland Kitchen omeprazole (PRILOSEC) 20 MG capsule Take 1 capsule (20 mg total) by mouth daily. (Patient not taking: Reported on 01/22/2020) 30 capsule 0   No current facility-administered medications on file prior to visit.    Family History  Problem Relation Age of Onset  . Sleep apnea Other   . Diabetes Other   . Hypertension Other   . Asthma Other   . Emphysema Other   . Allergies Other     Social History   Socioeconomic History  . Marital status: Single    Spouse name: Not on file  . Number of children: Not on file  . Years of education: Not on file  . Highest education level: Not on file  Occupational History  . Not  on file  Tobacco Use  . Smoking status: Former Smoker    Years: 15.00    Types: Cigarettes    Quit date: 12/22/2015    Years since quitting: 4.1  . Smokeless tobacco: Never Used  Vaping Use  . Vaping Use: Former  Substance and Sexual Activity  . Alcohol use: Yes    Comment: occ  . Drug use: No  . Sexual activity: Never  Other Topics Concern  . Not on file  Social History Narrative  . Not on file   Social Determinants of Health   Financial Resource Strain:   . Difficulty of Paying Living Expenses:   Food Insecurity:   . Worried About Programme researcher, broadcasting/film/video in the Last Year:   . Barista in the Last Year:   Transportation Needs:   . Freight forwarder (Medical):   Marland Kitchen Lack of Transportation (Non-Medical):   Physical Activity:   . Days of Exercise per Week:   . Minutes of Exercise per Session:   Stress:   . Feeling of Stress :   Social Connections:   . Frequency of Communication with Friends and Family:   . Frequency of Social Gatherings with Friends and Family:   . Attends Religious Services:   . Active Member of Clubs  or Organizations:   . Attends Banker Meetings:   Marland Kitchen Marital Status:   Intimate Partner Violence:   . Fear of Current or Ex-Partner:   . Emotionally Abused:   Marland Kitchen Physically Abused:   . Sexually Abused:     Review of Systems: ROS negative except for what is noted on the assessment and plan.  Vitals:   01/30/20 1041  BP: (!) 159/87  Pulse: 72  Temp: 98.1 F (36.7 C)  TempSrc: Oral  SpO2: 95%  Weight: (!) 376 lb 1.6 oz (170.6 kg)  Height: 5' 5.4" (1.661 m)     Physical Exam: Physical Exam Musculoskeletal:        General: Tenderness (right shoulder, superior and anterior aspects.) and signs of injury (right shoulder) present. No swelling.     Right shoulder: Tenderness present. No swelling, effusion or crepitus. Decreased range of motion (flexion, internal rotaiton and abduction ). Normal strength. Normal pulse.  Skin:     General: Skin is warm and dry.     Findings: No bruising, erythema or rash.      Assessment & Plan:   See Encounters Tab for problem based charting.  Patient discussed with Dr. Sarina Ill, D.O. [facility], [name] Pager: [phone], Phone: [phone] Date 01/31/2020 Time 7:50 AM
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
## 2020-02-01 LAB — HIV-1 RNA ULTRAQUANT REFLEX TO GENTYP+
HIV 1 RNA Quant: 1490 copies/mL — ABNORMAL HIGH
HIV-1 RNA Quant, Log: 3.17 Log copies/mL — ABNORMAL HIGH

## 2020-02-01 LAB — THYROID PANEL WITH TSH
Free Thyroxine Index: 1.8 (ref 1.4–3.8)
T3 Uptake: 30 % (ref 22–35)
T4, Total: 5.9 ug/dL (ref 5.1–11.9)
TSH: 3.85 mIU/L (ref 0.40–4.50)

## 2020-02-01 LAB — HEMOGLOBIN A1C
Hgb A1c MFr Bld: 5.4 % of total Hgb (ref <5.7)
Mean Plasma Glucose: 108 (calc)
eAG (mmol/L): 6 (calc)

## 2020-02-01 LAB — HIV-1 GENOTYPE: HIV-1 Genotype: DETECTED — AB

## 2020-02-08 ENCOUNTER — Other Ambulatory Visit: Payer: Self-pay | Admitting: Internal Medicine

## 2020-02-08 ENCOUNTER — Other Ambulatory Visit: Payer: Self-pay

## 2020-02-08 ENCOUNTER — Encounter: Payer: Self-pay | Admitting: Student

## 2020-02-08 ENCOUNTER — Ambulatory Visit (INDEPENDENT_AMBULATORY_CARE_PROVIDER_SITE_OTHER): Payer: Medicare Other | Admitting: Student

## 2020-02-08 VITALS — BP 155/79 | HR 79 | Temp 97.7°F | Ht 65.4 in | Wt 375.4 lb

## 2020-02-08 DIAGNOSIS — S4991XA Unspecified injury of right shoulder and upper arm, initial encounter: Secondary | ICD-10-CM

## 2020-02-08 DIAGNOSIS — I10 Essential (primary) hypertension: Secondary | ICD-10-CM | POA: Diagnosis not present

## 2020-02-08 MED ORDER — DARUN-COBIC-EMTRICIT-TENOFAF 800-150-200-10 MG PO TABS: 1.0000 | ORAL_TABLET | Freq: Every day | ORAL | 11 refills | Status: DC

## 2020-02-08 NOTE — Assessment & Plan Note (Signed)
BP still elevated with sBP in the 150s. Patient's shoulder pain and treatment with NSAIDs could be contributing to her elevated BP. Will reassess BP and adjust BP meds as necessary for optimal control. Of note, patient states she had cramps while on amlodipine in the past and "felt woozy, did not feel well" while on HCTZ.   Plan: --Continue current regimen of losartan and metoprolol for now.  --Consider revisiting the addition of a diuretic to her regimen at next follow up if BP is still elevated. Patient has LE swelling that will improve with a diuretic.
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
## 2020-02-08 NOTE — Patient Instructions (Signed)
Thank you, Ms.Aaisha Sliter for allowing [place] to provide your care today. Today we discussed your shoulder pain and blood pressure.  Please continue to take over-the-counter ibuprofen and Tylenol for your pain as well as heating pads and Biofreeze to help relieve the pain.  Continue to take your blood pressure medicines and we will make further changes at your next follow-up.  I have place a referrals to Physical therapy   Please follow-up in 4 weeks  Should you have any questions or concerns please call the internal medicine clinic at [phone].    Sharrell Ku, MD, MPH [facility]   My Chart Access: [email or web address]   If you have not already done so, please get your COVID 19 vaccine  To schedule an appointment for a COVID vaccine choice any of the following: Go to [email or web address]   Go to [email or web address]                  Call [phone]                                     Call [phone] and select Option 2
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
## 2020-02-09 NOTE — Progress Notes (Signed)
Internal Medicine Clinic Attending  I saw and evaluated the patient.  I personally confirmed the key portions of the history and exam documented by Dr. Amponsah and I reviewed pertinent patient test results.  The assessment, diagnosis, and plan were formulated together and I agree with the documentation in the resident's note.  

## 2020-02-10 IMAGING — DX DG RIBS 2V*L*
3 series · 3 of 3 positions shown · non-contrast
Comparison: Chest x-ray dated 07/30/2010 and 07/17/2010 and CT scan
dated 07/30/2010

CLINICAL DATA: Left upper quadrant pain and left anterior rib pain
since January 2018. No known injury.

EXAM:
LEFT RIBS - 2 VIEW

[rib pa (1 of 2)]
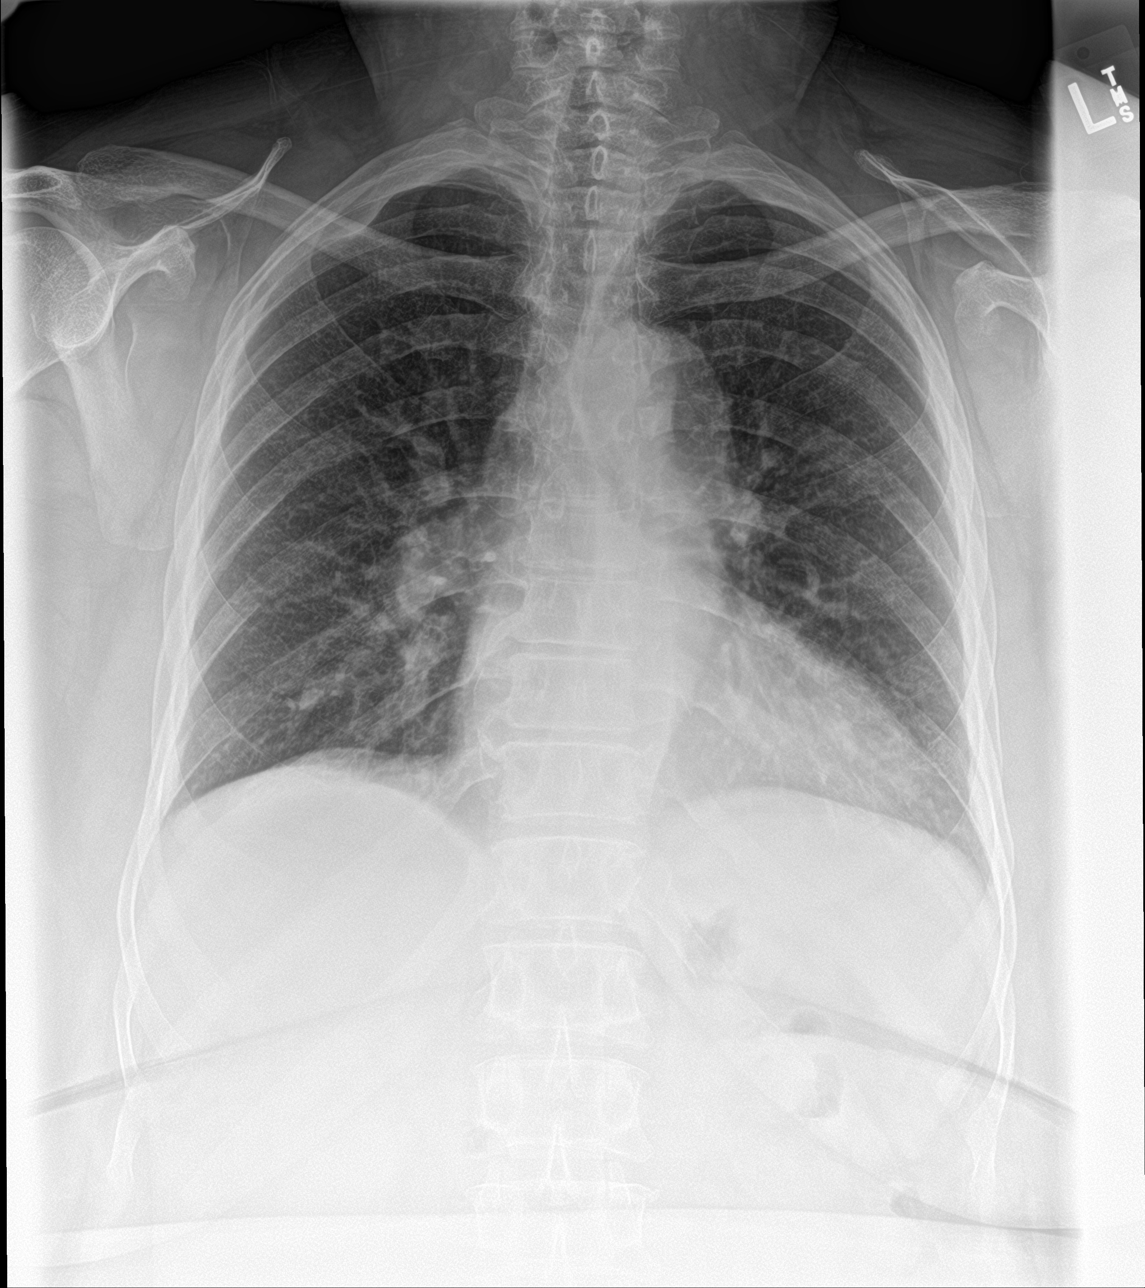

[rib pa obl]
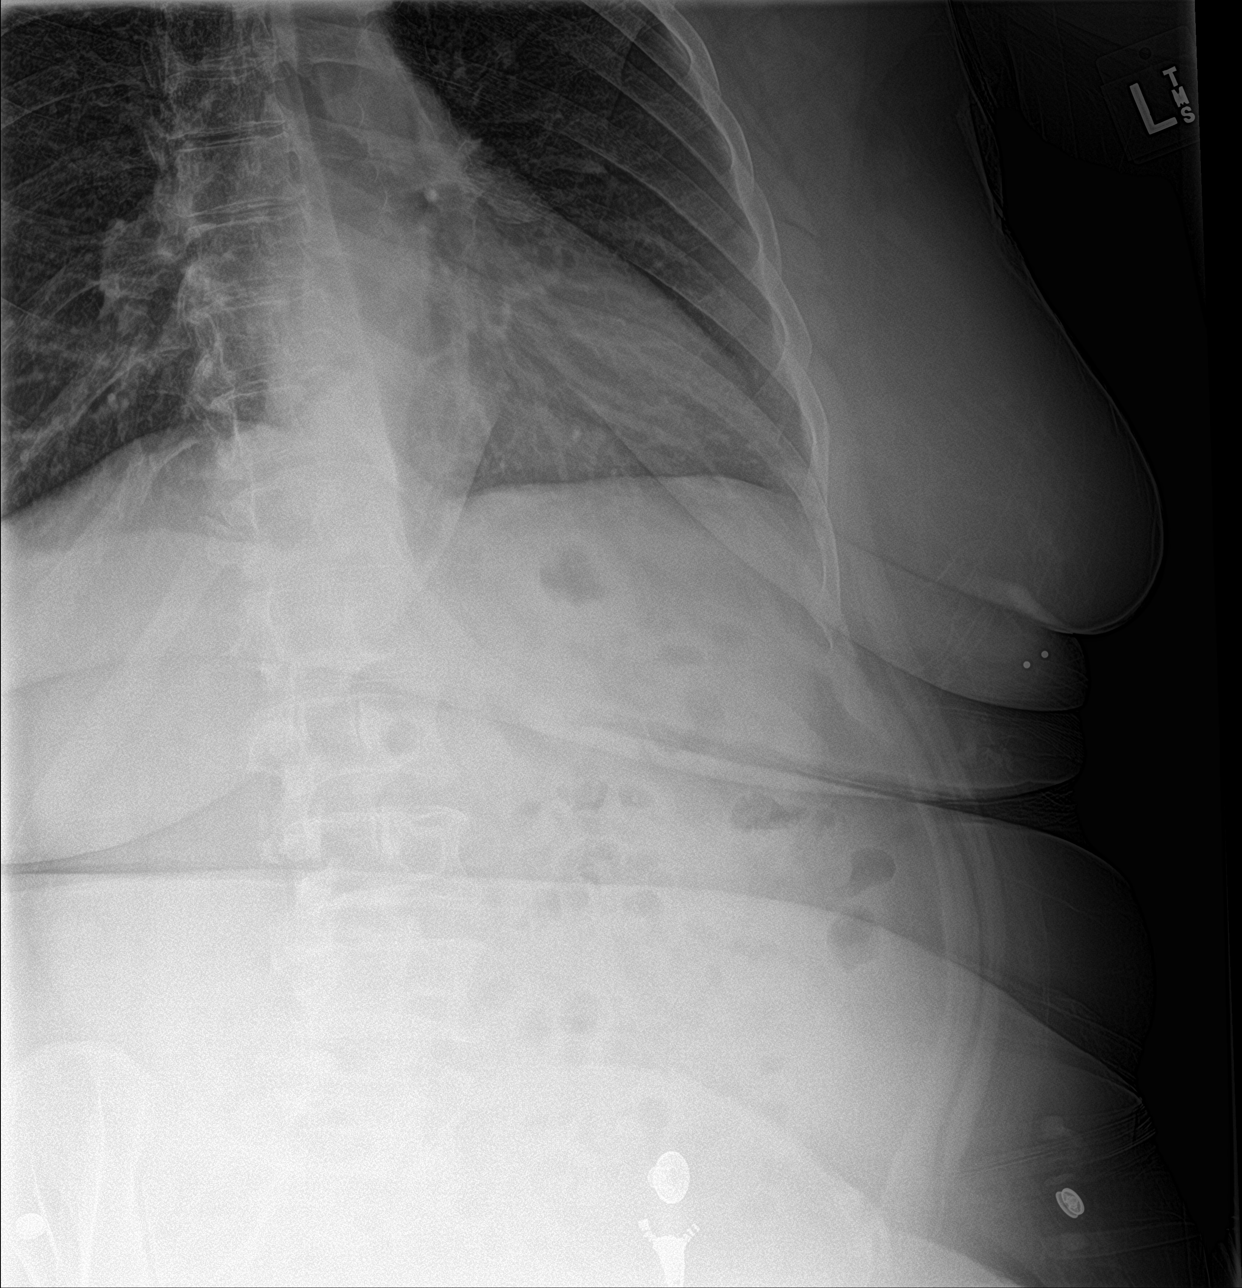

[rib pa (2 of 2)]
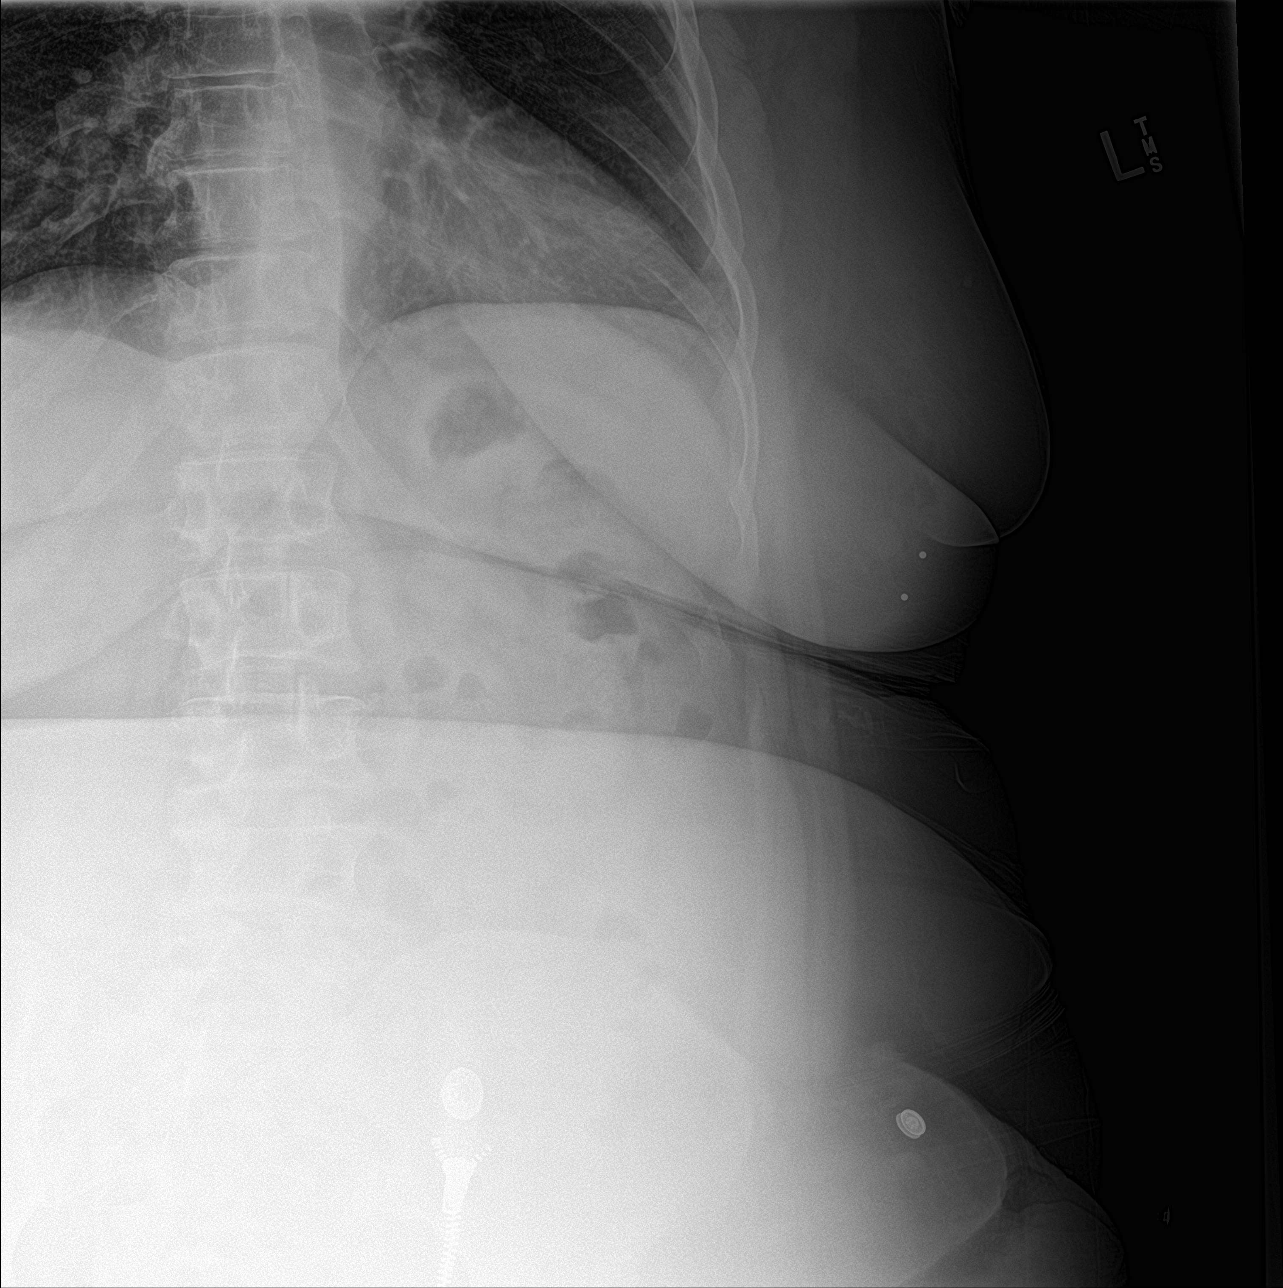

[3 of 3 positions shown; findings below may reference images not displayed]

FINDINGS: No fracture or other bone lesions are seen involving the ribs. Heart
size is normal. Probable calcified granuloma at the right lung base.
IMPRESSION: Normal left ribs.

## 2020-02-20 ENCOUNTER — Ambulatory Visit: Payer: Medicare Other | Admitting: Licensed Clinical Social Worker

## 2020-02-21 ENCOUNTER — Ambulatory Visit
Payer: Medicare Other | Attending: Student in an Organized Health Care Education/Training Program | Admitting: Physical Therapy

## 2020-02-21 ENCOUNTER — Other Ambulatory Visit: Payer: Self-pay

## 2020-02-21 ENCOUNTER — Encounter: Payer: Self-pay | Admitting: Physical Therapy

## 2020-02-21 DIAGNOSIS — M6281 Muscle weakness (generalized): Secondary | ICD-10-CM | POA: Diagnosis present

## 2020-02-21 DIAGNOSIS — M25611 Stiffness of right shoulder, not elsewhere classified: Secondary | ICD-10-CM | POA: Diagnosis present

## 2020-02-21 DIAGNOSIS — M25511 Pain in right shoulder: Secondary | ICD-10-CM | POA: Insufficient documentation
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
## 2020-02-26 ENCOUNTER — Encounter: Payer: Self-pay | Admitting: Physical Therapy

## 2020-02-26 ENCOUNTER — Other Ambulatory Visit: Payer: Self-pay

## 2020-02-26 ENCOUNTER — Ambulatory Visit: Payer: Medicare Other | Admitting: Physical Therapy

## 2020-02-26 DIAGNOSIS — M25511 Pain in right shoulder: Secondary | ICD-10-CM | POA: Diagnosis not present

## 2020-02-26 DIAGNOSIS — M25611 Stiffness of right shoulder, not elsewhere classified: Secondary | ICD-10-CM

## 2020-02-26 DIAGNOSIS — M6281 Muscle weakness (generalized): Secondary | ICD-10-CM

## 2020-02-26 NOTE — Therapy (Signed)
[facility] Outpatient Rehabilitation [facility] [address] [place], [place], [address] Phone: [phone]   Fax:  [phone]  Physical Therapy Treatment  Patient Details  Name: Claudia Brady MRN: [id number] Date of Birth: April 05, 1961 Referring Provider (PT): Sharrell Ku, MD   Encounter Date: 02/26/2020   PT End of Session - 02/26/20 1634    Visit Number 2    Number of Visits 12    Date for PT Re-Evaluation 04/03/20    Authorization Type UHC Medicare/MCD secondary, recheck FOTO status by visit 6    PT Start Time 1546    PT Stop Time 1637    PT Time Calculation (min) 51 min    Activity Tolerance Patient tolerated treatment well    Behavior During Therapy [facility] for tasks assessed/performed           Past Medical History:  Diagnosis Date  . ATRIAL FIBRILLATION 08/20/2010   Qualifier: History of  By: Excell Seltzer CMA, Lawson Fiscal    . Chronic pain   . Duodenal ulcer Jan. 2012   Upper GI bleeding 2nd to NSAID use  . GERD (gastroesophageal reflux disease)   . HIV (human immunodeficiency virus infection) (HCC) 2018  . Hypertension   . Iron deficiency anemia   . OSA (obstructive sleep apnea)   . Plantar fascia syndrome   . RESTLESS LEG SYNDROME 08/20/2010       . Rhinitis   . Varicose vein of leg     Past Surgical History:  Procedure Laterality Date  . FOOT SURGERY    . TONSILLECTOMY    . TUBAL LIGATION      There were no vitals filed for this visit.   Subjective Assessment - 02/26/20 1546    Subjective No pain pre-tx. but had pain earlier today radiating from neck into right shoulder and forearm. "Can feel" cervical retraction in neck, potentially some ease of arm symptoms with this. She does note some pec region soreness/tightness as well.    Currently in Pain? No/denies                             [facility] Adult PT Treatment/Exercise - 02/26/20 0001      Shoulder Exercises: Supine   Horizontal ABduction AROM;Strengthening;15 reps     Theraband Level (Shoulder Horizontal ABduction) Level 2 (Red)    External Rotation AROM;Strengthening;Both;15 reps    Theraband Level (Shoulder External Rotation) Level 2 (Red)    Internal Rotation AROM;Strengthening;Right;15 reps    Theraband Level (Shoulder Internal Rotation) Level 2 (Red)      Shoulder Exercises: Seated   Flexion AROM;Right;15 reps    ABduction Limitations Right shoulder scaption AROM to 90 deg x 15 reps      Shoulder Exercises: Standing   External Rotation Limitations see supine exercises    Internal Rotation Limitations see supine exercises    Extension AROM;Strengthening;Both;15 reps    Theraband Level (Shoulder Extension) Level 2 (Red)    Row AROM;Strengthening;Both;15 reps    Theraband Level (Shoulder Row) Level 3 (Green)      Shoulder Exercises: Lawyer 3 reps;20 seconds    Corner Stretch Limitations in doorway    Other Shoulder Stretches supine manual stretch left upper trapezius      Modalities   Modalities Moist Heat      Moist Heat Therapy   Number Minutes Moist Heat 10 Minutes    Moist Heat Location Shoulder      Manual  Therapy   Manual Therapy Joint mobilization;Soft tissue mobilization;Passive ROM    Manual therapy comments cervical manual traction    Joint Mobilization right 1st rib mobilization in sitting grade I-III    Soft tissue mobilization right lateral pec release in supine, trigger point release right upper trapezius and infraspinatus in sitting    Passive ROM brief PROM/stretching right shoulder                  PT Education - 02/26/20 1633    Education Details exercises, trigger point etiology, use "massager" vs. cane device for trigger point release    Person(s) Educated Patient    Methods Explanation;Demonstration;Verbal cues    Comprehension Verbalized understanding;Returned demonstration            PT Short Term Goals - 02/21/20 1708      PT SHORT TERM GOAL #1   Title Independent with initial  HEP    Baseline needs HEP    Time 3    Period Weeks    Status New    Target Date 03/13/20      PT SHORT TERM GOAL #2   Title Increase right shoulder AROM for IR reach behind back to at least sacral level for improved ability to bathe and dress    Baseline reach limited to gluteals    Time 3    Period Weeks    Status New    Target Date 03/13/20      PT SHORT TERM GOAL #3   Title Get dressed without need for son's assistance    Baseline needs assistance    Time 3    Period Weeks    Status New    Target Date 03/13/20             PT Long Term Goals - 02/21/20 1704      PT LONG TERM GOAL #1   Title Independent with advanced HEP for continued progress after d/c from formal therapy    Baseline needs HEP    Time 6    Period Weeks    Status New    Target Date 04/03/20      PT LONG TERM GOAL #2   Title Improve FOTO outcome measure score to 33% or less impairment due to shoulder    Baseline 50% limited    Time 6    Period Weeks    Target Date 04/03/20      PT LONG TERM GOAL #3   Title Increase right shoulder IR reach AROM behind back to at least L2 to improve ability for washing back and getting dressed    Baseline IR reach behind back limited to gluteals    Time 6    Period Weeks    Status New    Target Date 04/03/20      PT LONG TERM GOAL #4   Title Increase right shoulder strength at least grossly 1/2 MMT grade to improve ability for lifting for chores    Baseline see objective    Time 6    Period Weeks    Status New    Target Date 04/03/20      PT LONG TERM GOAL #5   Title Perform reaching for ADLs/IADLs with right shoulder pain decreased 75% or greater from current status    Time 6    Period Weeks    Status New    Target Date 04/03/20  Plan - 02/26/20 1635    Clinical Impression Statement Still somewhat unclear etiology more distal right UE radiating symptoms which could be radicular in etiology. Right arm pain reproduced  intermittently with manual to right upper trapezius and pec region. Soreness intermittently with exercises but overall session was well-tolerated-will await further response to manual and exercises regarding RUE radiating symptoms and progress as tolerated.    Personal Factors and Comorbidities Comorbidity 3+    Comorbidities see PMH    Examination-Activity Limitations Bathing;Dressing;Hygiene/Grooming;Carry;Reach Overhead    Examination-Participation Restrictions Cleaning    Stability/Clinical Decision Making Evolving/Moderate complexity    Clinical Decision Making Moderate    Rehab Potential Good    PT Frequency 2x / week    PT Duration 6 weeks    PT Treatment/Interventions ADLs/Self Care Home Management;Cryotherapy;Electrical Stimulation;Ultrasound;Iontophoresis 4mg /ml Dexamethasone;Moist Heat;Therapeutic activities;Therapeutic exercise;Neuromuscular re-education;Manual techniques;Patient/family education;Dry needling;Taping    PT Next Visit Plan continue progression shoulder strengthening/AROM as tolerated, Theraband shoulder strengthening, prn manual to pec, upper trap region, pending RUE radiating symptoms continue gentle traction as needed/found beneficial    PT Home Exercise Plan Access code:    Consulted and Agree with Plan of Care Patient           Patient will benefit from skilled therapeutic intervention in order to improve the following deficits and impairments:  Pain, Increased muscle spasms, Decreased strength, Impaired UE functional use, Decreased range of motion, Hypomobility  Visit Diagnosis: Acute pain of right shoulder  Stiffness of right shoulder, not elsewhere classified  Muscle weakness (generalized)     Problem List Patient Active Problem List   Diagnosis Date Noted  . Shoulder injury 01/31/2020  . Epigastric pain 10/18/2019  . Chest tightness 10/04/2019  . Hemorrhoid 04/05/2019  . Healthcare maintenance 04/05/2019  . Migraine 09/17/2018  .  Left upper quadrant pain 09/17/2018  . Venous insufficiency 09/16/2018  . Abnormal uterine bleeding (AUB) 02/04/2018  . Morbid obesity (HCC) 04/19/2017  . 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 HTN (hypertension) 10/23/2016  . Human immunodeficiency virus I infection (HCC) 10/02/2016  . Poor dentition 10/02/2016  . Depression 11/28/2010  . Obstructive sleep apnea 08/20/2010    10/19/2010, PT, DPT 02/26/20 4:43 PM  [facility] [address] [place], [place], [place] Phone: [phone]   Fax:  [phone]  Name: Claudia Brady MRN: Galvin Proffer Date of Birth: 24-Jul-1960

## 2020-02-28 ENCOUNTER — Ambulatory Visit: Payer: Medicare Other | Admitting: Licensed Clinical Social Worker

## 2020-03-01 ENCOUNTER — Encounter: Payer: Self-pay | Admitting: Physical Therapy

## 2020-03-01 ENCOUNTER — Ambulatory Visit: Payer: Medicare Other | Admitting: Physical Therapy

## 2020-03-01 ENCOUNTER — Other Ambulatory Visit: Payer: Self-pay

## 2020-03-01 DIAGNOSIS — M25611 Stiffness of right shoulder, not elsewhere classified: Secondary | ICD-10-CM

## 2020-03-01 DIAGNOSIS — M25511 Pain in right shoulder: Secondary | ICD-10-CM | POA: Diagnosis not present

## 2020-03-01 DIAGNOSIS — M6281 Muscle weakness (generalized): Secondary | ICD-10-CM

## 2020-03-01 NOTE — Therapy (Signed)
[facility] [address] [place], [place], [address] Phone: [phone]   Fax:  [phone]  Physical Therapy Treatment  Patient Details  Name: Claudia Brady MRN: [id number] Date of Birth: 1961/01/20 Referring Provider (PT): Sharrell Ku, MD   Encounter Date: 03/01/2020   PT End of Session - 03/01/20 1233    Visit Number 3    Number of Visits 12    Date for PT Re-Evaluation 04/03/20    Authorization Type UHC Medicare/MCD secondary, recheck FOTO status by visit 6    PT Start Time 1230    PT Stop Time 1312    PT Time Calculation (min) 42 min           Past Medical History:  Diagnosis Date  . ATRIAL FIBRILLATION 08/20/2010   Qualifier: History of  By: Excell Seltzer CMA, Lawson Fiscal    . Chronic pain   . Duodenal ulcer Jan. 2012   Upper GI bleeding 2nd to NSAID use  . GERD (gastroesophageal reflux disease)   . HIV (human immunodeficiency virus infection) (HCC) 2018  . Hypertension   . Iron deficiency anemia   . OSA (obstructive sleep apnea)   . Plantar fascia syndrome   . RESTLESS LEG SYNDROME 08/20/2010       . Rhinitis   . Varicose vein of leg     Past Surgical History:  Procedure Laterality Date  . FOOT SURGERY    . TONSILLECTOMY    . TUBAL LIGATION      There were no vitals filed for this visit.   Subjective Assessment - 03/01/20 1232    Subjective Shoulder is not hurting like it was before PT. I feel improvement.    Currently in Pain? No/denies              [facility] PT Assessment - 03/01/20 0001      AROM   Right Shoulder Internal Rotation --   reach to gluteals                        [facility] Adult PT Treatment/Exercise - 03/01/20 0001      Shoulder Exercises: Supine   Horizontal ABduction AROM;Strengthening;20 reps    External Rotation AROM;Strengthening;Both;15 reps    Theraband Level (Shoulder External Rotation) Level 2 (Red)      Shoulder Exercises: Seated   Other Seated Exercises  Cervical retractions      Shoulder Exercises: Standing   External Rotation 20 reps    Theraband Level (Shoulder External Rotation) Level 2 (Red)    Internal Rotation 20 reps    Theraband Level (Shoulder Internal Rotation) Level 2 (Red)    Extension AROM;Strengthening;Both;20 reps    Theraband Level (Shoulder Extension) Level 3 (Green)    Row AROM;Strengthening;Both;20 reps    Theraband Level (Shoulder Row) Level 3 (Green)      Shoulder Exercises: Pulleys   Flexion 1 minute    Scaption 1 minute      Shoulder Exercises: Stretch   Corner Stretch 3 reps;20 seconds    Corner Stretch Limitations in doorway    Internal Rotation Stretch Limitations towel in axilla for distraction with stretching followed by standing IR UE ranger     Other Shoulder Stretches seated upper trap stretch       Manual Therapy   Passive ROM brief PROM/stretching right shoulder                    PT Short Term Goals -  02/21/20 1708      PT SHORT TERM GOAL #1   Title Independent with initial HEP    Baseline needs HEP    Time 3    Period Weeks    Status New    Target Date 03/13/20      PT SHORT TERM GOAL #2   Title Increase right shoulder AROM for IR reach behind back to at least sacral level for improved ability to bathe and dress    Baseline reach limited to gluteals    Time 3    Period Weeks    Status New    Target Date 03/13/20      PT SHORT TERM GOAL #3   Title Get dressed without need for son's assistance    Baseline needs assistance    Time 3    Period Weeks    Status New    Target Date 03/13/20             PT Long Term Goals - 02/21/20 1704      PT LONG TERM GOAL #1   Title Independent with advanced HEP for continued progress after d/c from formal therapy    Baseline needs HEP    Time 6    Period Weeks    Status New    Target Date 04/03/20      PT LONG TERM GOAL #2   Title Improve FOTO outcome measure score to 33% or less impairment due to shoulder    Baseline 50%  limited    Time 6    Period Weeks    Target Date 04/03/20      PT LONG TERM GOAL #3   Title Increase right shoulder IR reach AROM behind back to at least L2 to improve ability for washing back and getting dressed    Baseline IR reach behind back limited to gluteals    Time 6    Period Weeks    Status New    Target Date 04/03/20      PT LONG TERM GOAL #4   Title Increase right shoulder strength at least grossly 1/2 MMT grade to improve ability for lifting for chores    Baseline see objective    Time 6    Period Weeks    Status New    Target Date 04/03/20      PT LONG TERM GOAL #5   Title Perform reaching for ADLs/IADLs with right shoulder pain decreased 75% or greater from current status    Time 6    Period Weeks    Status New    Target Date 04/03/20                 Plan - 03/01/20 1234    Clinical Impression Statement Pt reports overall improvement in pain intensity and less stiffness. Continued with ROM and strengthening. Increased pain with IR stretching however she declined HMP and reported feeling fine at end of session.    PT Next Visit Plan continue progression shoulder strengthening/AROM as tolerated, Theraband shoulder strengthening, prn manual to pec, upper trap region, pending RUE radiating symptoms continue gentle traction as needed/found beneficial    PT Home Exercise Plan Access code: [email or web address]           Patient will benefit from skilled therapeutic intervention in order to improve the following deficits and impairments:  Pain, Increased muscle spasms, Decreased strength, Impaired UE functional use, Decreased range of motion, Hypomobility  Visit Diagnosis: Acute pain of right shoulder  Stiffness of right shoulder, not elsewhere classified  Muscle weakness (generalized)     Problem List Patient Active Problem List   Diagnosis Date Noted  . Shoulder injury 01/31/2020  . Epigastric pain 10/18/2019  . Chest tightness 10/04/2019  . Hemorrhoid  04/05/2019  . Healthcare maintenance 04/05/2019  . Migraine 09/17/2018  . Left upper quadrant pain 09/17/2018  . Venous insufficiency 09/16/2018  . Abnormal uterine bleeding (AUB) 02/04/2018  . Morbid obesity (HCC) 04/19/2017  . 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 HTN (hypertension) 10/23/2016  . Human immunodeficiency virus I infection (HCC) 10/02/2016  . Poor dentition 10/02/2016  . Depression 11/28/2010  . Obstructive sleep apnea 08/20/2010    Sherrie Mustache, PTA 03/01/2020, 1:14 PM  [facility] [address] [place], [place], [address] Phone: [phone]   Fax:  [phone]  Name: Claudia Brady MRN: [id number] Date of Birth: 1961-06-08

## 2020-03-05 ENCOUNTER — Encounter: Payer: Self-pay | Admitting: Physical Therapy

## 2020-03-05 ENCOUNTER — Other Ambulatory Visit: Payer: Self-pay

## 2020-03-05 ENCOUNTER — Ambulatory Visit: Payer: Medicare Other | Admitting: Physical Therapy

## 2020-03-05 DIAGNOSIS — M25611 Stiffness of right shoulder, not elsewhere classified: Secondary | ICD-10-CM

## 2020-03-05 DIAGNOSIS — M25511 Pain in right shoulder: Secondary | ICD-10-CM

## 2020-03-05 DIAGNOSIS — M6281 Muscle weakness (generalized): Secondary | ICD-10-CM

## 2020-03-05 NOTE — Therapy (Signed)
[facility] Outpatient Rehabilitation [facility] [address] [place], [place], [address] Phone: [phone]   Fax:  [phone]  Physical Therapy Treatment  Patient Details  Name: Claudia Brady MRN: [id number] Date of Birth: 1960/09/12 Referring Provider (PT): Sharrell Ku, MD   Encounter Date: 03/05/2020   PT End of Session - 03/05/20 1234    Visit Number 4    Number of Visits 12    Date for PT Re-Evaluation 04/03/20    Authorization Type UHC Medicare/MCD secondary, recheck FOTO status by visit 6    PT Start Time 1230    PT Stop Time 1320    PT Time Calculation (min) 50 min           Past Medical History:  Diagnosis Date  . ATRIAL FIBRILLATION 08/20/2010   Qualifier: History of  By: Excell Seltzer CMA, Lawson Fiscal    . Chronic pain   . Duodenal ulcer Jan. 2012   Upper GI bleeding 2nd to NSAID use  . GERD (gastroesophageal reflux disease)   . HIV (human immunodeficiency virus infection) (HCC) 2018  . Hypertension   . Iron deficiency anemia   . OSA (obstructive sleep apnea)   . Plantar fascia syndrome   . RESTLESS LEG SYNDROME 08/20/2010       . Rhinitis   . Varicose vein of leg     Past Surgical History:  Procedure Laterality Date  . FOOT SURGERY    . TONSILLECTOMY    . TUBAL LIGATION      There were no vitals filed for this visit.   Subjective Assessment - 03/05/20 1233    Subjective The shoulder is still doing better. My son is helping me.    Currently in Pain? No/denies              [facility] PT Assessment - 03/05/20 0001      AROM   Right Shoulder Internal Rotation --   reach to lumbar                        [facility] Adult PT Treatment/Exercise - 03/05/20 0001      Shoulder Exercises: Standing   External Rotation 20 reps    Theraband Level (Shoulder External Rotation) Level 2 (Red)    Internal Rotation 20 reps    Theraband Level (Shoulder Internal Rotation) Level 2 (Red)    Extension AROM;Strengthening;Both;20 reps     Theraband Level (Shoulder Extension) Level 3 (Green)    Row AROM;Strengthening;Both;20 reps    Theraband Level (Shoulder Row) Level 3 (Green)    Other Standing Exercises Wall washing x 1 minute     Other Standing Exercises cabinet reaching 0# 10 x 2 middle shelf-(lifting tub of puddy)      Shoulder Exercises: Pulleys   Flexion 1 minute    Scaption 1 minute      Shoulder Exercises: Stretch   Corner Stretch 3 reps;20 seconds    Corner Stretch Limitations in doorway    Internal Rotation Stretch 3 reps   30 sec, with towel over shoulder   Internal Rotation Stretch Limitations also towel in axilla for distraction pull with stretching followed by standing IR UE ranger     Other Shoulder Stretches seated upper trap stretch       Moist Heat Therapy   Number Minutes Moist Heat 10 Minutes   right, seated   Moist Heat Location Shoulder  PT Short Term Goals - 02/21/20 1708      PT SHORT TERM GOAL #1   Title Independent with initial HEP    Baseline needs HEP    Time 3    Period Weeks    Status New    Target Date 03/13/20      PT SHORT TERM GOAL #2   Title Increase right shoulder AROM for IR reach behind back to at least sacral level for improved ability to bathe and dress    Baseline reach limited to gluteals    Time 3    Period Weeks    Status New    Target Date 03/13/20      PT SHORT TERM GOAL #3   Title Get dressed without need for son's assistance    Baseline needs assistance    Time 3    Period Weeks    Status New    Target Date 03/13/20             PT Long Term Goals - 02/21/20 1704      PT LONG TERM GOAL #1   Title Independent with advanced HEP for continued progress after d/c from formal therapy    Baseline needs HEP    Time 6    Period Weeks    Status New    Target Date 04/03/20      PT LONG TERM GOAL #2   Title Improve FOTO outcome measure score to 33% or less impairment due to shoulder    Baseline 50% limited    Time 6     Period Weeks    Target Date 04/03/20      PT LONG TERM GOAL #3   Title Increase right shoulder IR reach AROM behind back to at least L2 to improve ability for washing back and getting dressed    Baseline IR reach behind back limited to gluteals    Time 6    Period Weeks    Status New    Target Date 04/03/20      PT LONG TERM GOAL #4   Title Increase right shoulder strength at least grossly 1/2 MMT grade to improve ability for lifting for chores    Baseline see objective    Time 6    Period Weeks    Status New    Target Date 04/03/20      PT LONG TERM GOAL #5   Title Perform reaching for ADLs/IADLs with right shoulder pain decreased 75% or greater from current status    Time 6    Period Weeks    Status New    Target Date 04/03/20                 Plan - 03/05/20 1239    Clinical Impression Statement Pt reports continued improvement in pain and functional reaching especailly into IR behind back. Able to reach lower lumbar. Continued with shoulder ROM and strengthening. Began overhead reaching. She tolerated the session well with some fatigue.    PT Next Visit Plan continue progression shoulder strengthening/AROM as tolerated, Theraband shoulder strengthening, prn manual to pec, upper trap region, pending RUE radiating symptoms continue gentle traction as needed/found beneficial    PT Home Exercise Plan Access code: [email or web address]           Patient will benefit from skilled therapeutic intervention in order to improve the following deficits and impairments:  Pain, Increased muscle spasms, Decreased strength, Impaired UE functional use, Decreased range of motion, Hypomobility  Visit Diagnosis: Acute pain of right shoulder  Stiffness of right shoulder, not elsewhere classified  Muscle weakness (generalized)     Problem List Patient Active Problem List   Diagnosis Date Noted  . Shoulder injury 01/31/2020  . Epigastric pain 10/18/2019  . Chest tightness 10/04/2019  .  Hemorrhoid 04/05/2019  . Healthcare maintenance 04/05/2019  . Migraine 09/17/2018  . Left upper quadrant pain 09/17/2018  . Venous insufficiency 09/16/2018  . Abnormal uterine bleeding (AUB) 02/04/2018  . Morbid obesity (HCC) 04/19/2017  . 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 HTN (hypertension) 10/23/2016  . Human immunodeficiency virus I infection (HCC) 10/02/2016  . Poor dentition 10/02/2016  . Depression 11/28/2010  . Obstructive sleep apnea 08/20/2010    Sherrie Mustache, PTA 03/05/2020, 1:09 PM  [facility] [address] [place], [place], [address] Phone: [phone]   Fax:  [phone]  Name: Claudia Brady MRN: [id number] Date of Birth: 02-Feb-1961

## 2020-03-07 ENCOUNTER — Ambulatory Visit: Payer: Medicare Other | Admitting: Physical Therapy

## 2020-03-07 ENCOUNTER — Other Ambulatory Visit: Payer: Self-pay

## 2020-03-07 ENCOUNTER — Encounter: Payer: Self-pay | Admitting: Physical Therapy

## 2020-03-07 DIAGNOSIS — M25611 Stiffness of right shoulder, not elsewhere classified: Secondary | ICD-10-CM

## 2020-03-07 DIAGNOSIS — M25511 Pain in right shoulder: Secondary | ICD-10-CM | POA: Diagnosis not present

## 2020-03-07 DIAGNOSIS — M6281 Muscle weakness (generalized): Secondary | ICD-10-CM

## 2020-03-07 NOTE — Therapy (Signed)
[facility] Outpatient Rehabilitation [facility] [address] [place], [place], [address] Phone: [phone]   Fax:  [phone]  Physical Therapy Treatment  Patient Details  Name: Claudia Brady MRN: [id number] Date of Birth: 07-18-1960 Referring Provider (PT): Sharrell Ku, MD   Encounter Date: 03/07/2020   PT End of Session - 03/07/20 1712    Visit Number 5    Number of Visits 12    Date for PT Re-Evaluation 04/03/20    Authorization Type UHC Medicare/MCD secondary, recheck FOTO status by visit 6    PT Start Time 1709    PT Stop Time 1801    PT Time Calculation (min) 52 min    Activity Tolerance Patient tolerated treatment well    Behavior During Therapy [facility] for tasks assessed/performed           Past Medical History:  Diagnosis Date  . ATRIAL FIBRILLATION 08/20/2010   Qualifier: History of  By: Excell Seltzer CMA, Lawson Fiscal    . Chronic pain   . Duodenal ulcer Jan. 2012   Upper GI bleeding 2nd to NSAID use  . GERD (gastroesophageal reflux disease)   . HIV (human immunodeficiency virus infection) (HCC) 2018  . Hypertension   . Iron deficiency anemia   . OSA (obstructive sleep apnea)   . Plantar fascia syndrome   . RESTLESS LEG SYNDROME 08/20/2010       . Rhinitis   . Varicose vein of leg     Past Surgical History:  Procedure Laterality Date  . FOOT SURGERY    . TONSILLECTOMY    . TUBAL LIGATION      There were no vitals filed for this visit.   Subjective Assessment - 03/07/20 1713    Subjective No pain pre-tx. and overall reports continued improvement with shoulder/less pain but still noting some stiffness reaching behind back with right UE-has difficulty with pain and tightness with donning bra.    Currently in Pain? No/denies                             [facility] Adult PT Treatment/Exercise - 03/07/20 0001      Shoulder Exercises: Supine   Protraction AROM;Strengthening;Right;15 reps    Protraction Weight (lbs) 2    Protraction  Limitations serratus punch    Flexion AROM;Strengthening;Right;20 reps    Shoulder Flexion Weight (lbs) 2    Flexion Limitations short arc flexion "punch"      Shoulder Exercises: Sidelying   External Rotation AROM;Strengthening;Right;20 reps    External Rotation Weight (lbs) 2    External Rotation Limitations started with 1 lb. but too light so switched to 2 lbs.    ABduction AROM;Right;15 reps    ABduction Limitations plane of scaption      Shoulder Exercises: Standing   External Rotation 20 reps    Theraband Level (Shoulder External Rotation) Level 2 (Red)    Internal Rotation 20 reps    Theraband Level (Shoulder Internal Rotation) Level 2 (Red)    Extension AROM;Strengthening;Both;20 reps    Theraband Level (Shoulder Extension) Level 3 (Green)    Row AROM;Strengthening;Both;20 reps    Theraband Level (Shoulder Row) Level 3 (Green)      Moist Heat Therapy   Number Minutes Moist Heat 10 Minutes   right side in sitting   Moist Heat Location Shoulder      Manual Therapy   Joint Mobilization right GH mobilization grade I-III AP mobs    Soft tissue  mobilization right posterior shoulder/periscapular region    Passive ROM right shoulder PROM-IR focus                  PT Education - 03/07/20 1755    Education Details exercises, tennis ball use with pillow case for self posterior scapular region STM at wall    Person(s) Educated Patient    Methods Explanation;Demonstration;Verbal cues    Comprehension Verbalized understanding;Returned demonstration            PT Short Term Goals - 02/21/20 1708      PT SHORT TERM GOAL #1   Title Independent with initial HEP    Baseline needs HEP    Time 3    Period Weeks    Status New    Target Date 03/13/20      PT SHORT TERM GOAL #2   Title Increase right shoulder AROM for IR reach behind back to at least sacral level for improved ability to bathe and dress    Baseline reach limited to gluteals    Time 3    Period Weeks      Status New    Target Date 03/13/20      PT SHORT TERM GOAL #3   Title Get dressed without need for son's assistance    Baseline needs assistance    Time 3    Period Weeks    Status New    Target Date 03/13/20             PT Long Term Goals - 02/21/20 1704      PT LONG TERM GOAL #1   Title Independent with advanced HEP for continued progress after d/c from formal therapy    Baseline needs HEP    Time 6    Period Weeks    Status New    Target Date 04/03/20      PT LONG TERM GOAL #2   Title Improve FOTO outcome measure score to 33% or less impairment due to shoulder    Baseline 50% limited    Time 6    Period Weeks    Target Date 04/03/20      PT LONG TERM GOAL #3   Title Increase right shoulder IR reach AROM behind back to at least L2 to improve ability for washing back and getting dressed    Baseline IR reach behind back limited to gluteals    Time 6    Period Weeks    Status New    Target Date 04/03/20      PT LONG TERM GOAL #4   Title Increase right shoulder strength at least grossly 1/2 MMT grade to improve ability for lifting for chores    Baseline see objective    Time 6    Period Weeks    Status New    Target Date 04/03/20      PT LONG TERM GOAL #5   Title Perform reaching for ADLs/IADLs with right shoulder pain decreased 75% or greater from current status    Time 6    Period Weeks    Status New    Target Date 04/03/20                 Plan - 03/07/20 1755    Clinical Impression Statement Pt. improving as previously with decreased right shoulder pain from baseline status with functional gains for reaching ability. Still some tightness and pain reaching behind back with RUE but this is improving and overall  prgressing well re: therapy goals.    Personal Factors and Comorbidities Comorbidity 3+    Comorbidities see PMH    Examination-Activity Limitations Bathing;Dressing;Hygiene/Grooming;Carry;Reach Overhead    Examination-Participation  Restrictions Cleaning    Stability/Clinical Decision Making Evolving/Moderate complexity    Clinical Decision Making Moderate    Rehab Potential Good    PT Frequency 2x / week    PT Duration 6 weeks    PT Treatment/Interventions ADLs/Self Care Home Management;Cryotherapy;Electrical Stimulation;Ultrasound;Iontophoresis 4mg /ml Dexamethasone;Moist Heat;Therapeutic activities;Therapeutic exercise;Neuromuscular re-education;Manual techniques;Patient/family education;Dry needling;Taping    PT Next Visit Plan Recheck FOTO status next session (visit 6), continue progression of shoulder strengthening/ROM with IR focus, manual and modalities prn    PT Home Exercise Plan Access code:    Consulted and Agree with Plan of Care Patient           Patient will benefit from skilled therapeutic intervention in order to improve the following deficits and impairments:  Pain, Increased muscle spasms, Decreased strength, Impaired UE functional use, Decreased range of motion, Hypomobility  Visit Diagnosis: Acute pain of right shoulder  Stiffness of right shoulder, not elsewhere classified  Muscle weakness (generalized)     Problem List Patient Active Problem List   Diagnosis Date Noted  . Shoulder injury 01/31/2020  . Epigastric pain 10/18/2019  . Chest tightness 10/04/2019  . Hemorrhoid 04/05/2019  . Healthcare maintenance 04/05/2019  . Migraine 09/17/2018  . Left upper quadrant pain 09/17/2018  . Venous insufficiency 09/16/2018  . Abnormal uterine bleeding (AUB) 02/04/2018  . Morbid obesity (HCC) 04/19/2017  . 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 HTN (hypertension) 10/23/2016  . Human immunodeficiency virus I infection (HCC) 10/02/2016  . Poor dentition 10/02/2016  . Depression 11/28/2010  . Obstructive sleep apnea 08/20/2010    10/19/2010, PT, DPT 03/07/20 6:05 PM  [facility] Outpatient Rehabilitation [facility] [address] [place], [place], [place] Phone:  [phone]   Fax:  [phone]  Name: Claudia Brady MRN: Galvin Proffer Date of Birth: 10-Apr-1961

## 2020-03-11 ENCOUNTER — Encounter: Payer: Self-pay | Admitting: Physical Therapy

## 2020-03-11 ENCOUNTER — Other Ambulatory Visit: Payer: Self-pay

## 2020-03-11 ENCOUNTER — Ambulatory Visit: Payer: Medicare Other | Admitting: Physical Therapy

## 2020-03-11 DIAGNOSIS — M25511 Pain in right shoulder: Secondary | ICD-10-CM

## 2020-03-11 DIAGNOSIS — M6281 Muscle weakness (generalized): Secondary | ICD-10-CM

## 2020-03-11 DIAGNOSIS — M25611 Stiffness of right shoulder, not elsewhere classified: Secondary | ICD-10-CM

## 2020-03-11 NOTE — Therapy (Signed)
[facility] [address] [place], [place], [address] Phone: [phone]   Fax:  [phone]  Physical Therapy Treatment  Patient Details  Name: Claudia Brady MRN: [id number] Date of Birth: [birth year] Referring Provider (PT): Sharrell Ku, MD   Encounter Date: 03/11/2020   PT End of Session - 03/11/20 1151    Visit Number 6    Number of Visits 12    Date for PT Re-Evaluation 04/03/20    Authorization Type UHC Medicare/MCD secondary, recheck FOTO status by visit 10    PT Start Time 1147    PT Stop Time 1228    PT Time Calculation (min) 41 min           Past Medical History:  Diagnosis Date  . ATRIAL FIBRILLATION 08/20/2010   Qualifier: History of  By: Excell Seltzer CMA, Lawson Fiscal    . Chronic pain   . Duodenal ulcer Jan. 2012   Upper GI bleeding 2nd to NSAID use  . GERD (gastroesophageal reflux disease)   . HIV (human immunodeficiency virus infection) (HCC) 2018  . Hypertension   . Iron deficiency anemia   . OSA (obstructive sleep apnea)   . Plantar fascia syndrome   . RESTLESS LEG SYNDROME 08/20/2010       . Rhinitis   . Varicose vein of leg     Past Surgical History:  Procedure Laterality Date  . FOOT SURGERY    . TONSILLECTOMY    . TUBAL LIGATION      There were no vitals filed for this visit.   Subjective Assessment - 03/11/20 1149    Subjective I am a little achey in my shoulder.    Currently in Pain? No/denies    Pain Score 4     Pain Location Shoulder    Pain Orientation Right    Pain Descriptors / Indicators Aching    Aggravating Factors  reaching behind back, dressing    Pain Relieving Factors massager, medicated patch              [facility] PT Assessment - 03/11/20 0001      AROM   Right Shoulder Internal Rotation --   reach to gluteals                        [facility] Adult PT Treatment/Exercise - 03/11/20 0001      Shoulder Exercises: Supine   Protraction 20 reps;AROM     Horizontal ABduction AROM;Strengthening;15 reps    Theraband Level (Shoulder Horizontal ABduction) Level 2 (Red)      Shoulder Exercises: Standing   Extension Limitations --    Row 15 reps    Theraband Level (Shoulder Row) Level 4 (Blue)    Other Standing Exercises Wall washing x 1 minute  and 30 seconds     Other Standing Exercises cabinet reaching 1# x10 middle, 2# x 10 middle- feels stretching in right periscap      Shoulder Exercises: Pulleys   Flexion 1 minute    Scaption 1 minute      Shoulder Exercises: Stretch   Internal Rotation Stretch 3 reps   30 sec, with towel over shoulder   Internal Rotation Stretch Limitations also towel in axilla for distraction pull with stretching followed by standing IR UE ranger     Other Shoulder Stretches seated upper trap stretch , levaotir stretch       Manual Therapy   Passive ROM right shoulder PROM-IR focus  PT Short Term Goals - 02/21/20 1708      PT SHORT TERM GOAL #1   Title Independent with initial HEP    Baseline needs HEP    Time 3    Period Weeks    Status New    Target Date 03/13/20      PT SHORT TERM GOAL #2   Title Increase right shoulder AROM for IR reach behind back to at least sacral level for improved ability to bathe and dress    Baseline reach limited to gluteals    Time 3    Period Weeks    Status New    Target Date 03/13/20      PT SHORT TERM GOAL #3   Title Get dressed without need for son's assistance    Baseline needs assistance    Time 3    Period Weeks    Status New    Target Date 03/13/20             PT Long Term Goals - 02/21/20 1704      PT LONG TERM GOAL #1   Title Independent with advanced HEP for continued progress after d/c from formal therapy    Baseline needs HEP    Time 6    Period Weeks    Status New    Target Date 04/03/20      PT LONG TERM GOAL #2   Title Improve FOTO outcome measure score to 33% or less impairment due to shoulder    Baseline  50% limited    Time 6    Period Weeks    Target Date 04/03/20      PT LONG TERM GOAL #3   Title Increase right shoulder IR reach AROM behind back to at least L2 to improve ability for washing back and getting dressed    Baseline IR reach behind back limited to gluteals    Time 6    Period Weeks    Status New    Target Date 04/03/20      PT LONG TERM GOAL #4   Title Increase right shoulder strength at least grossly 1/2 MMT grade to improve ability for lifting for chores    Baseline see objective    Time 6    Period Weeks    Status New    Target Date 04/03/20      PT LONG TERM GOAL #5   Title Perform reaching for ADLs/IADLs with right shoulder pain decreased 75% or greater from current status    Time 6    Period Weeks    Status New    Target Date 04/03/20                 Plan - 03/11/20 1206    Clinical Impression Statement FOTO score improved from 50% limited to 38% limited. Today pt is more achey in shoulder and notes more difficulty with dressing today. Her IR ROM is less than previous sessions, reaching only to right gluteals.    PT Next Visit Plan Begin cabinet rreaching with progressive resistance, wall washing, etc, continue progression of shoulder strengthening/ROM with IR focus, manual and modalities prn    PT Home Exercise Plan Access code: [email or web address]           Patient will benefit from skilled therapeutic intervention in order to improve the following deficits and impairments:  Pain, Increased muscle spasms, Decreased strength, Impaired UE functional use, Decreased range of motion, Hypomobility  Visit Diagnosis: Acute pain  of right shoulder  Stiffness of right shoulder, not elsewhere classified  Muscle weakness (generalized)     Problem List Patient Active Problem List   Diagnosis Date Noted  . Shoulder injury 01/31/2020  . Epigastric pain 10/18/2019  . Chest tightness 10/04/2019  . Hemorrhoid 04/05/2019  . Healthcare maintenance 04/05/2019  .  Migraine 09/17/2018  . Left upper quadrant pain 09/17/2018  . Venous insufficiency 09/16/2018  . Abnormal uterine bleeding (AUB) 02/04/2018  . Morbid obesity (HCC) 04/19/2017  . 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 HTN (hypertension) 10/23/2016  . Human immunodeficiency virus I infection (HCC) 10/02/2016  . Poor dentition 10/02/2016  . Depression 11/28/2010  . Obstructive sleep apnea 08/20/2010    Sherrie Mustache, PTA 03/11/2020, 12:55 PM  [facility] [address] [place], [place], [address] Phone: [phone]   Fax:  [phone]  Name: Claudia Brady MRN: [id number] Date of Birth: 11/15/1960

## 2020-03-13 ENCOUNTER — Ambulatory Visit
Payer: Medicare Other | Attending: Student in an Organized Health Care Education/Training Program | Admitting: Physical Therapy

## 2020-03-13 ENCOUNTER — Encounter: Payer: Self-pay | Admitting: Physical Therapy

## 2020-03-13 ENCOUNTER — Other Ambulatory Visit: Payer: Self-pay

## 2020-03-13 ENCOUNTER — Encounter: Payer: Medicare Other | Admitting: Internal Medicine

## 2020-03-13 DIAGNOSIS — M25611 Stiffness of right shoulder, not elsewhere classified: Secondary | ICD-10-CM

## 2020-03-13 DIAGNOSIS — M6281 Muscle weakness (generalized): Secondary | ICD-10-CM | POA: Insufficient documentation

## 2020-03-13 DIAGNOSIS — M25511 Pain in right shoulder: Secondary | ICD-10-CM | POA: Insufficient documentation

## 2020-03-13 NOTE — Therapy (Signed)
[facility] [address] [place], [place], [address] Phone: [phone]   Fax:  [phone]  Physical Therapy Treatment  Patient Details  Name: Claudia Brady MRN: [id number] Date of Birth: 12-Jul-1961 Referring Provider (PT): Sharrell Ku, MD   Encounter Date: 03/13/2020   PT End of Session - 03/13/20 1232    Visit Number 7    Number of Visits 12    Date for PT Re-Evaluation 04/03/20    Authorization Type UHC Medicare/MCD secondary, recheck FOTO status by visit 10    PT Start Time 1147    PT Stop Time 1240    PT Time Calculation (min) 53 min    Activity Tolerance Patient tolerated treatment well    Behavior During Therapy [facility] for tasks assessed/performed           Past Medical History:  Diagnosis Date  . ATRIAL FIBRILLATION 08/20/2010   Qualifier: History of  By: Excell Seltzer CMA, Lawson Fiscal    . Chronic pain   . Duodenal ulcer Jan. 2012   Upper GI bleeding 2nd to NSAID use  . GERD (gastroesophageal reflux disease)   . HIV (human immunodeficiency virus infection) (HCC) 2018  . Hypertension   . Iron deficiency anemia   . OSA (obstructive sleep apnea)   . Plantar fascia syndrome   . RESTLESS LEG SYNDROME 08/20/2010       . Rhinitis   . Varicose vein of leg     Past Surgical History:  Procedure Laterality Date  . FOOT SURGERY    . TONSILLECTOMY    . TUBAL LIGATION      There were no vitals filed for this visit.   Subjective Assessment - 03/13/20 1147    Subjective Pt. reports a little less sore than at last tx. session. Still some pain with getting dressed and continues with some stiffness reaching behind back with RUE.    Patient Stated Goals Get arm better    Currently in Pain? Yes    Pain Score 2     Pain Location Shoulder    Pain Orientation Right    Pain Descriptors / Indicators Stabbing;Pressure    Pain Type Acute pain    Pain Onset More than a month ago    Pain Frequency Intermittent    Aggravating Factors   dressing, reahc                             [facility] Adult PT Treatment/Exercise - 03/13/20 0001      Shoulder Exercises: Supine   Protraction AROM;Strengthening;Right;20 reps    Protraction Weight (lbs) 2    Protraction Limitations supine short arc flexion punch combo with serratus punch 2 lbs. 2x10      Shoulder Exercises: Sidelying   External Rotation AROM;Strengthening;Right;20 reps    External Rotation Weight (lbs) 1    ABduction AROM;Right;20 reps    ABduction Limitations scaption      Shoulder Exercises: Standing   External Rotation AROM;Strengthening;Right;20 reps    Theraband Level (Shoulder External Rotation) Level 2 (Red)    Internal Rotation AROM;Strengthening;Right;20 reps    Theraband Level (Shoulder Internal Rotation) Level 3 (Green)    Extension AROM;Both;20 reps    Theraband Level (Shoulder Extension) Level 3 (Green)    Row AROM;Strengthening;Both;20 reps    Theraband Level (Shoulder Row) Level 4 (Blue)    Other Standing Exercises Wall washing x 1 minute  and 30 seconds     Other  Standing Exercises cabinet reaching to middle shelf 2 lbs. 2x10 RUE      Shoulder Exercises: Pulleys   Flexion 2 minutes    Scaption 1 minute    Scaption Limitations cues for hand position and angle of ROM      Shoulder Exercises: Stretch   Internal Rotation Stretch Limitations 3x20 sec holds with stretch strap    Other Shoulder Stretches right posterior capsule stretch 20 sec x 3      Moist Heat Therapy   Number Minutes Moist Heat 10 Minutes    Moist Heat Location Shoulder      Manual Therapy   Joint Mobilization Right GH mobilization-AP grade I-III    Soft tissue mobilization right posterior shoulder and including trigger point release right middle deltoid region    Passive ROM right shoulder PROM-IR focus                  PT Education - 03/13/20 1231    Education Details exercises, reviewed tennis ball use with pillow case for posterior shoulder  STM at wall    Person(s) Educated Patient    Methods Explanation;Demonstration;Verbal cues    Comprehension Verbalized understanding;Returned demonstration            PT Short Term Goals - 02/21/20 1708      PT SHORT TERM GOAL #1   Title Independent with initial HEP    Baseline needs HEP    Time 3    Period Weeks    Status New    Target Date 03/13/20      PT SHORT TERM GOAL #2   Title Increase right shoulder AROM for IR reach behind back to at least sacral level for improved ability to bathe and dress    Baseline reach limited to gluteals    Time 3    Period Weeks    Status New    Target Date 03/13/20      PT SHORT TERM GOAL #3   Title Get dressed without need for son's assistance    Baseline needs assistance    Time 3    Period Weeks    Status New    Target Date 03/13/20             PT Long Term Goals - 02/21/20 1704      PT LONG TERM GOAL #1   Title Independent with advanced HEP for continued progress after d/c from formal therapy    Baseline needs HEP    Time 6    Period Weeks    Status New    Target Date 04/03/20      PT LONG TERM GOAL #2   Title Improve FOTO outcome measure score to 33% or less impairment due to shoulder    Baseline 50% limited    Time 6    Period Weeks    Target Date 04/03/20      PT LONG TERM GOAL #3   Title Increase right shoulder IR reach AROM behind back to at least L2 to improve ability for washing back and getting dressed    Baseline IR reach behind back limited to gluteals    Time 6    Period Weeks    Status New    Target Date 04/03/20      PT LONG TERM GOAL #4   Title Increase right shoulder strength at least grossly 1/2 MMT grade to improve ability for lifting for chores    Baseline see objective  Time 6    Period Weeks    Status New    Target Date 04/03/20      PT LONG TERM GOAL #5   Title Perform reaching for ADLs/IADLs with right shoulder pain decreased 75% or greater from current status    Time 6     Period Weeks    Status New    Target Date 04/03/20                 Plan - 03/13/20 1232    Clinical Impression Statement Still with some pain/limitation in abduction ROM which could be consistent with impingement as well as tightness reaching behind back with RUE though less stiff than at last session. Reviewed stretches for HEP and expect continued progress with further formal PT along with HEP. Otherwise/overall gradually improving from baseline status with progress re: therapy goals.    Personal Factors and Comorbidities Comorbidity 3+    Comorbidities see PMH    Examination-Activity Limitations Bathing;Dressing;Hygiene/Grooming;Carry;Reach Overhead    Examination-Participation Restrictions Cleaning    Stability/Clinical Decision Making Evolving/Moderate complexity    Clinical Decision Making Moderate    Rehab Potential Good    PT Frequency 2x / week    PT Duration 6 weeks    PT Treatment/Interventions ADLs/Self Care Home Management;Cryotherapy;Electrical Stimulation;Ultrasound;Iontophoresis 4mg /ml Dexamethasone;Moist Heat;Therapeutic activities;Therapeutic exercise;Neuromuscular re-education;Manual techniques;Patient/family education;Dry needling;Taping    PT Next Visit Plan Continue cabinet reaching with progressive resistance, wall washing, etc, continue progression of shoulder strengthening/ROM with IR focus, manual and modalities prn    PT Home Exercise Plan Access code:    Consulted and Agree with Plan of Care Patient           Patient will benefit from skilled therapeutic intervention in order to improve the following deficits and impairments:  Pain, Increased muscle spasms, Decreased strength, Impaired UE functional use, Decreased range of motion, Hypomobility  Visit Diagnosis: Acute pain of right shoulder  Stiffness of right shoulder, not elsewhere classified  Muscle weakness (generalized)     Problem List Patient Active Problem List   Diagnosis  Date Noted  . Shoulder injury 01/31/2020  . Epigastric pain 10/18/2019  . Chest tightness 10/04/2019  . Hemorrhoid 04/05/2019  . Healthcare maintenance 04/05/2019  . Migraine 09/17/2018  . Left upper quadrant pain 09/17/2018  . Venous insufficiency 09/16/2018  . Abnormal uterine bleeding (AUB) 02/04/2018  . Morbid obesity (HCC) 04/19/2017  . 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 HTN (hypertension) 10/23/2016  . Human immunodeficiency virus I infection (HCC) 10/02/2016  . Poor dentition 10/02/2016  . Depression 11/28/2010  . Obstructive sleep apnea 08/20/2010    10/19/2010, PT, DPT 03/13/20 12:35 PM  [facility] [address] [place], [place], [place] Phone: [phone]   Fax:  [phone]  Name: Claudia Brady MRN: Galvin Proffer Date of Birth: May 08, 1961

## 2020-03-19 ENCOUNTER — Other Ambulatory Visit: Payer: Self-pay

## 2020-03-19 ENCOUNTER — Encounter: Payer: Self-pay | Admitting: Physical Therapy

## 2020-03-19 ENCOUNTER — Ambulatory Visit (INDEPENDENT_AMBULATORY_CARE_PROVIDER_SITE_OTHER): Payer: Medicare Other | Admitting: Licensed Clinical Social Worker

## 2020-03-19 ENCOUNTER — Ambulatory Visit: Payer: Medicare Other | Admitting: Physical Therapy

## 2020-03-19 DIAGNOSIS — M25611 Stiffness of right shoulder, not elsewhere classified: Secondary | ICD-10-CM

## 2020-03-19 DIAGNOSIS — M25511 Pain in right shoulder: Secondary | ICD-10-CM

## 2020-03-19 DIAGNOSIS — F419 Anxiety disorder, unspecified: Secondary | ICD-10-CM

## 2020-03-19 DIAGNOSIS — M6281 Muscle weakness (generalized): Secondary | ICD-10-CM | POA: Diagnosis not present

## 2020-03-19 NOTE — BH Specialist Note (Signed)
Integrated Behavioral Health Initial Visit  MRN: [id number] Name: Claudia Brady  Number of Integrated Behavioral Health Clinician visits:: 1/6 Session Start time: 10:30  Session End time: 11:20 Total time: 50 minutes  Type of Service: Integrated Behavioral Health- [place] Interpretor:No.   SUBJECTIVE: Claudia Brady is a 59 y.o. female  whom attended the session individually. Patient was referred by Dr. Cleaster Corin for anxiety and depression. Patient reports the following symptoms/concerns: depression, anxiety, health issues, and medication compliance. Duration of problem: since January 2020; Severity of problem: moderate  OBJECTIVE: Mood: Depressed and Affect: Appropriate Risk of harm to self or others: No plan to harm self or others  GOALS ADDRESSED: Patient will: 1. Reduce symptoms of: anxiety, depression and lack of self-care. 2. Increase knowledge and/or ability of: coping skills, healthy habits and stress reduction  3. Demonstrate ability to: Increase healthy adjustment to current life circumstances, Increase adequate support systems for patient/family, Increase motivation to adhere to plan of care and Improve medication compliance  INTERVENTIONS: Interventions utilized: Motivational Interviewing, Brief CBT, Supportive Counseling, Medication Monitoring and Link to [employer]  Standardized Assessments completed: assessed for SI, HI, and self-harm.  ASSESSMENT: Patient currently experiencing moderate levels of anxiety and depression, with a reported improvement in symptoms over the past four months. Patient reported the onset of anxiety and depression peaked when Covid started. Patient processed anxiety around her fear of contracting Covid. Patient reported that her family is not aware of some of her heath conditions, and has not completely understood why she has been so cautious/isolating through the Covid process.   Patient does have a strong support system.  Patient reported she is not currently taking any of her medications. Patient has anxiety around medications, and how they cause her to feel. Therefore, the patient reported she will stop taking them. Patient is not currently using her Cpap machine either due to fear that it has been recalled. A message was sent to the triage nurse to follow up with the patient about her concerns with medications/Cpap machine.    Patient may benefit from counseling and medication counseling.  PLAN: 1. Follow up with behavioral health clinician on : will start care with Joni Reining, VBJ 2. Behavioral recommendations: talk to your doctor about medication compliance and anxiety related to taking medications/somatic symptoms. 3. Referral(s): Integrated [employer] (In Clinic) [facility]  [place], [facility], LCAS

## 2020-03-19 NOTE — Therapy (Signed)
[place], [place], [address] Phone: [phone]   Fax:  [phone]  Physical Therapy Treatment  Patient Details  Name: Claudia Brady MRN: [id number] Date of Birth: 1961/05/30 Referring Provider (PT): Linwood Dibbles, MD   Encounter Date: 03/19/2020   PT End of Session - 03/19/20 1330    Visit Number 8    Number of Visits 12    Date for PT Re-Evaluation 04/03/20    Authorization Type UHC Medicare/MCD secondary, recheck FOTO status by visit 10    PT Start Time 1327    PT Stop Time 1417    PT Time Calculation (min) 50 min    Activity Tolerance Patient tolerated treatment well    Behavior During Therapy [facility] for tasks assessed/performed           Past Medical History:  Diagnosis Date  . ATRIAL FIBRILLATION 08/20/2010   Qualifier: History of  By: Burt Knack CMA, Cecille Rubin    . Chronic pain   . Duodenal ulcer Jan. 2012   Upper GI bleeding 2nd to NSAID use  . GERD (gastroesophageal reflux disease)   . HIV (human immunodeficiency virus infection) ([place]) 2018  . Hypertension   . Iron deficiency anemia   . OSA (obstructive sleep apnea)   . Plantar fascia syndrome   . RESTLESS LEG SYNDROME 08/20/2010       . Rhinitis   . Varicose vein of leg     Past Surgical History:  Procedure Laterality Date  . FOOT SURGERY    . TONSILLECTOMY    . TUBAL LIGATION      There were no vitals filed for this visit.   Subjective Assessment - 03/19/20 1329    Subjective Overall shoulder continues to improve. Still some soreness with stretching at times but notes getting a little easier to reach behind back as well as reach down for activities such as tying shoe using right arm.    Currently in Pain? No/denies              [facility] PT Assessment - 03/19/20 0001      AROM   Right Shoulder Internal Rotation --   reach to T12                        [facility] Adult PT Treatment/Exercise - 03/19/20 0001       Shoulder Exercises: Supine   Protraction AROM;Strengthening;Right;20 reps    Protraction Weight (lbs) 2    Protraction Limitations serratus punch    Horizontal ABduction AROM;Strengthening;Both;20 reps    Theraband Level (Shoulder Horizontal ABduction) Level 3 (Green)      Shoulder Exercises: Sidelying   External Rotation AROM;Strengthening;Right;20 reps    External Rotation Weight (lbs) 1      Shoulder Exercises: Standing   External Rotation AROM;Strengthening;Right;20 reps    Theraband Level (Shoulder External Rotation) Level 2 (Red)    External Rotation Limitations cues to keep elbow at side    Internal Rotation AROM;Strengthening;Right;20 reps    Theraband Level (Shoulder Internal Rotation) Level 3 (Green)    Internal Rotation Limitations cues for angle of ROM    Flexion AROM;Strengthening;Right;20 reps    Shoulder Flexion Weight (lbs) 2    Flexion Limitations 2x10 flexion to 90 deg, started with 1 lb. but too light so increased to 2 lbs.    ABduction AROM;Strengthening;Right;15 reps    Shoulder ABduction Weight (lbs) 1    ABduction Limitations plane of scaption, limited  to 15 reos due to soreness    Extension AROM;Both;20 reps    Theraband Level (Shoulder Extension) Level 3 (Green)    Row AROM;Strengthening;Both;20 reps    Theraband Level (Shoulder Row) Level 4 (Blue)    Other Standing Exercises wall slide flexion 2x10 with pillow case on right hand    Other Standing Exercises cabinet reaching to middle shelf 2 lbs. 2x10 RUE      Shoulder Exercises: Pulleys   Flexion 2 minutes    Scaption 1 minute    Scaption Limitations cues for hand position and angle of ROM      Shoulder Exercises: ROM/Strengthening   Wall Pushups 10 reps      Moist Heat Therapy   Number Minutes Moist Heat 10 Minutes    Moist Heat Location Shoulder      Manual Therapy   Joint Mobilization Right GH mobilization-AP grade I-III, cuadal glides grade I-II    Soft tissue mobilization Right posterior  shoulder                    PT Short Term Goals - 02/21/20 1708      PT SHORT TERM GOAL #1   Title Independent with initial HEP    Baseline needs HEP    Time 3    Period Weeks    Status New    Target Date 03/13/20      PT SHORT TERM GOAL #2   Title Increase right shoulder AROM for IR reach behind back to at least sacral level for improved ability to bathe and dress    Baseline reach limited to gluteals    Time 3    Period Weeks    Status New    Target Date 03/13/20      PT SHORT TERM GOAL #3   Title Get dressed without need for son's assistance    Baseline needs assistance    Time 3    Period Weeks    Status New    Target Date 03/13/20             PT Long Term Goals - 03/19/20 1338      PT LONG TERM GOAL #1   Title Independent with advanced HEP for continued progress after d/c from formal therapy    Baseline ongoing for prn updates    Time 6    Period Weeks    Status On-going      PT LONG TERM GOAL #2   Title Improve FOTO outcome measure score to 33% or less impairment due to shoulder    Baseline 50% limited    Time 6    Period Weeks    Status On-going      PT LONG TERM GOAL #3   Title Increase right shoulder IR reach AROM behind back to at least L2 to improve ability for washing back and getting dressed    Baseline reach to T12 03/19/20    Time 6    Period Weeks    Status Achieved      PT LONG TERM GOAL #4   Title Increase right shoulder strength at least grossly 1/2 MMT grade to improve ability for lifting for chores    Time 6    Period Weeks    Status On-going      PT LONG TERM GOAL #5   Title Perform reaching for ADLs/IADLs with right shoulder pain decreased 75% or greater from current status    Time 6  Period Weeks    Status On-going                 Plan - 03/19/20 1348    Clinical Impression Statement Improved ability for right shoulder IR reach for AROM behind back with LTG for this met. Still some pain with  abduction>flexion consistent with impingement but overall/otherwise progressing well re: therapy goals.    Personal Factors and Comorbidities Comorbidity 3+    Comorbidities see PMH    Examination-Activity Limitations Bathing;Dressing;Hygiene/Grooming;Carry;Reach Overhead    Examination-Participation Restrictions Cleaning    Stability/Clinical Decision Making Evolving/Moderate complexity    Clinical Decision Making Moderate    Rehab Potential Good    PT Frequency 2x / week    PT Duration 6 weeks    PT Treatment/Interventions ADLs/Self Care Home Management;Cryotherapy;Electrical Stimulation;Ultrasound;Iontophoresis 84m/ml Dexamethasone;Moist Heat;Therapeutic activities;Therapeutic exercise;Neuromuscular re-education;Manual techniques;Patient/family education;Dry needling;Taping    PT Next Visit Plan Progress note by visit 10, Continue cabinet reaching with progressive resistance, wall washing, etc, continue progression of shoulder strengthening/ROM with IR focus, manual and modalities prn    PT Home Exercise Plan Access code: [email or web address]   Consulted and Agree with Plan of Care Patient           Patient will benefit from skilled therapeutic intervention in order to improve the following deficits and impairments:  Pain, Increased muscle spasms, Decreased strength, Impaired UE functional use, Decreased range of motion, Hypomobility  Visit Diagnosis: Acute pain of right shoulder  Stiffness of right shoulder, not elsewhere classified  Muscle weakness (generalized)     Problem List Patient Active Problem List   Diagnosis Date Noted  . Shoulder injury 01/31/2020  . Epigastric pain 10/18/2019  . Chest tightness 10/04/2019  . Hemorrhoid 04/05/2019  . Healthcare maintenance 04/05/2019  . Migraine 09/17/2018  . Left upper quadrant pain 09/17/2018  . Venous insufficiency 09/16/2018  . Abnormal uterine bleeding (AUB) 02/04/2018  . Morbid obesity ([place] 04/19/2017  . 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 HTN (hypertension) 10/23/2016  . Human immunodeficiency virus I infection ([place] 10/02/2016  . Poor dentition 10/02/2016  . Depression 11/28/2010  . Obstructive sleep apnea 08/20/2010    CBeaulah Dinning PT, DPT 03/19/20 2:10 PM  [facility] [address]Phone: [phone]  Fax:  [phone] Name: MSuzy KugelMRN: [id number] of Birth: [birth year]

## 2020-03-21 ENCOUNTER — Encounter: Payer: Self-pay | Admitting: Physical Therapy

## 2020-03-21 ENCOUNTER — Other Ambulatory Visit: Payer: Self-pay

## 2020-03-21 ENCOUNTER — Encounter: Payer: Self-pay | Admitting: Licensed Clinical Social Worker

## 2020-03-21 ENCOUNTER — Ambulatory Visit: Payer: Medicare Other | Admitting: Physical Therapy

## 2020-03-21 DIAGNOSIS — M25511 Pain in right shoulder: Secondary | ICD-10-CM

## 2020-03-21 DIAGNOSIS — M25611 Stiffness of right shoulder, not elsewhere classified: Secondary | ICD-10-CM

## 2020-03-21 DIAGNOSIS — M6281 Muscle weakness (generalized): Secondary | ICD-10-CM

## 2020-03-21 NOTE — Therapy (Signed)
[place], [place], [address] Phone: [phone]   Fax:  [phone]  Physical Therapy Treatment Progress Note Reporting Period 02/21/2020 to 03/21/2020  See note below for Objective Data and Assessment of Progress/Goals.       Patient Details  Name: Claudia Brady MRN: [id number] Date of Birth: 01/10/1961 Referring Provider (PT): Linwood Dibbles, MD   Encounter Date: 03/21/2020   PT End of Session - 03/21/20 1610    Visit Number 9    Number of Visits 12    Date for PT Re-Evaluation 04/03/20    Authorization Type UHC Medicare/MCD secondary, recheck FOTO status by visit 10, next progess note at visit 63    PT Start Time 1543    PT Stop Time 1609   session ended early per pt. request/had to leave early due to ride   PT Time Calculation (min) 26 min    Activity Tolerance Patient tolerated treatment well    Behavior During Therapy [facility] for tasks assessed/performed           Past Medical History:  Diagnosis Date  . ATRIAL FIBRILLATION 08/20/2010   Qualifier: History of  By: Burt Knack CMA, Cecille Rubin    . Chronic pain   . Duodenal ulcer Jan. 2012   Upper GI bleeding 2nd to NSAID use  . GERD (gastroesophageal reflux disease)   . HIV (human immunodeficiency virus infection) ([place]) 2018  . Hypertension   . Iron deficiency anemia   . OSA (obstructive sleep apnea)   . Plantar fascia syndrome   . RESTLESS LEG SYNDROME 08/20/2010       . Rhinitis   . Varicose vein of leg     Past Surgical History:  Procedure Laterality Date  . FOOT SURGERY    . TONSILLECTOMY    . TUBAL LIGATION      There were no vitals filed for this visit.   Subjective Assessment - 03/21/20 1551    Subjective No shoulder pain this PM. Pt. reports had pain on right "side" (points to proximal right hip region) earlier today which she associates with "cramp" with onset while holding bag in left hand. Right shoulder ROM for reaching behind  back continues to improve. Pt. request to end session early today due to another schedule obligation/has a ride picking her up from the clinic early.    Limitations Lifting;House hold activities    Currently in Pain? Yes    Pain Score 3     Pain Location Hip    Pain Orientation Right;Proximal;Anterior    Pain Descriptors / Indicators Cramping    Pain Type Acute pain    Pain Onset Today    Pain Frequency Constant    Aggravating Factors  carrying items with left hand    Pain Relieving Factors no specific eases              [facility] PT Assessment - 03/21/20 0001      AROM   Right Shoulder Flexion 168 Degrees    Right Shoulder ABduction 140 Degrees    Right Shoulder Internal Rotation --   reach to T12   Right Shoulder External Rotation --   reach to T2     Strength   Right Shoulder Flexion 4+/5    Right Shoulder ABduction 4+/5    Right Shoulder Internal Rotation 5/5    Right Shoulder External Rotation 5/5    Right Elbow Flexion 5/5    Right Elbow Extension 5/5  [facility] Adult PT Treatment/Exercise - 03/21/20 0001      Shoulder Exercises: Standing   External Rotation AROM;Strengthening;Right;20 reps    Theraband Level (Shoulder External Rotation) Level 3 (Green)    Internal Rotation AROM;Strengthening;Right;20 reps    Theraband Level (Shoulder Internal Rotation) Level 3 (Green)    Flexion AROM;Strengthening;Right;20 reps    Shoulder Flexion Weight (lbs) 2    Flexion Limitations 2x10 flexion to 90 deg, started with 1 lb. but too light so increased to 2 lbs.    ABduction AROM;Strengthening;Right;15 reps    Shoulder ABduction Weight (lbs) 1    ABduction Limitations plane of scaption, limited to 15 reos due to soreness    Extension AROM;Both;20 reps    Theraband Level (Shoulder Extension) Level 4 (Blue)    Row AROM;Strengthening;Both;20 reps    Theraband Level (Shoulder Row) Level 4 (Blue)    Other Standing Exercises wall push up with a plus  x 15 reps      Shoulder Exercises: Pulleys   Flexion 2 minutes    Scaption 1 minute    Scaption Limitations cues for hand position and angle of ROM                    PT Short Term Goals - 03/21/20 1611      PT SHORT TERM GOAL #1   Title Independent with initial HEP    Baseline met    Time 3    Period Weeks    Status Achieved      PT SHORT TERM GOAL #2   Title Increase right shoulder AROM for IR reach behind back to at least sacral level for improved ability to bathe and dress    Baseline reach to T12    Time 3    Period Weeks    Status Achieved      PT SHORT TERM GOAL #3   Title Get dressed without need for son's assistance    Baseline met/able    Time 3    Period Weeks    Status Achieved             PT Long Term Goals - 03/21/20 1612      PT LONG TERM GOAL #1   Title Independent with advanced HEP for continued progress after d/c from formal therapy    Baseline ongoing for prn updates    Time 6    Period Weeks    Status On-going      PT LONG TERM GOAL #2   Title Improve FOTO outcome measure score to 33% or less impairment due to shoulder    Baseline 38% limited as of 03/11/20    Time 6    Period Weeks    Status On-going      PT LONG TERM GOAL #3   Title Increase right shoulder IR reach AROM behind back to at least L2 to improve ability for washing back and getting dressed    Baseline reach to T12    Time 6    Period Weeks    Status Achieved      PT LONG TERM GOAL #4   Title Increase right shoulder strength at least grossly 1/2 MMT grade to improve ability for lifting for chores    Baseline see objective-met, revise goal for shoulder strength 5/5    Time 6    Period Weeks    Status Revised      PT LONG TERM GOAL #5   Title Perform reaching for  ADLs/IADLs with right shoulder pain decreased 75% or greater from current status    Baseline improving, still ongoing    Time 6    Period Weeks    Status On-going                 Plan -  03/21/20 1616    Clinical Impression Statement Pt. has been progressing well with therapy to date for decreased right shoulder pain, ROM gains (see objective) particularly for reaching behind back for RUE as well as for overhead reaching and also improving strength from baseline status. STGs for therapy met. LTGs for FOTO outcome score, pain and strength (strength goal revised) ongoing-plan continue PT for remaining POC at which point pending progress and further status will determine need for continued formal therapy vs. d/c to HEP. For right hip "cramp" as noted in subjective would suspect possible muscle strain but will continue to monitor and if not improving would recommend see MD for assessment.    Personal Factors and Comorbidities Comorbidity 3+    Comorbidities see PMH    Examination-Activity Limitations Bathing;Dressing;Hygiene/Grooming;Carry;Reach Overhead    Examination-Participation Restrictions Cleaning    Stability/Clinical Decision Making Evolving/Moderate complexity    Clinical Decision Making Moderate    Rehab Potential Good    PT Frequency 2x / week    PT Duration 6 weeks    PT Treatment/Interventions ADLs/Self Care Home Management;Cryotherapy;Electrical Stimulation;Ultrasound;Iontophoresis 30m/ml Dexamethasone;Moist Heat;Therapeutic activities;Therapeutic exercise;Neuromuscular re-education;Manual techniques;Patient/family education;Dry needling;Taping    PT Next Visit Plan Continue cabinet reaching with progressive resistance, wall washing, etc, continue progression of shoulder strengthening/ROM with IR focus, manual and modalities prn    PT Home Exercise Plan Access code: [email or web address]   Consulted and Agree with Plan of Care Patient           Patient will benefit from skilled therapeutic intervention in order to improve the following deficits and impairments:  Pain, Increased muscle spasms, Decreased strength, Impaired UE functional use, Decreased range of motion,  Hypomobility  Visit Diagnosis: Acute pain of right shoulder  Stiffness of right shoulder, not elsewhere classified  Muscle weakness (generalized)     Problem List Patient Active Problem List   Diagnosis Date Noted  . Shoulder injury 01/31/2020  . Epigastric pain 10/18/2019  . Chest tightness 10/04/2019  . Hemorrhoid 04/05/2019  . Healthcare maintenance 04/05/2019  . Migraine 09/17/2018  . Left upper quadrant pain 09/17/2018  . Venous insufficiency 09/16/2018  . Abnormal uterine bleeding (AUB) 02/04/2018  . Morbid obesity ([place] 04/19/2017  . 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 HTN (hypertension) 10/23/2016  . Human immunodeficiency virus I infection ([place] 10/02/2016  . Poor dentition 10/02/2016  . Depression 11/28/2010  . Obstructive sleep apnea 08/20/2010    CBeaulah Dinning PT, DPT 03/21/20 4:23 PM  [facility] [place] [address]Phone: [phone]  Fax:  [phone] Name: MQuilla FreezeMRN: [id number] of Birth: [birth year]

## 2020-03-25 ENCOUNTER — Encounter: Payer: Self-pay | Admitting: Internal Medicine

## 2020-03-25 ENCOUNTER — Ambulatory Visit (INDEPENDENT_AMBULATORY_CARE_PROVIDER_SITE_OTHER): Payer: Medicare Other | Admitting: Internal Medicine

## 2020-03-25 ENCOUNTER — Other Ambulatory Visit: Payer: Self-pay

## 2020-03-25 VITALS — BP 165/105 | HR 78 | Wt 377.9 lb

## 2020-03-25 DIAGNOSIS — Z6841 Body Mass Index (BMI) 40.0 and over, adult: Secondary | ICD-10-CM

## 2020-03-25 DIAGNOSIS — M222X1 Patellofemoral disorders, right knee: Secondary | ICD-10-CM | POA: Diagnosis not present

## 2020-03-25 DIAGNOSIS — M222X2 Patellofemoral disorders, left knee: Secondary | ICD-10-CM

## 2020-03-25 DIAGNOSIS — I10 Essential (primary) hypertension: Secondary | ICD-10-CM

## 2020-03-25 DIAGNOSIS — B20 Human immunodeficiency virus [HIV] disease: Secondary | ICD-10-CM

## 2020-03-25 HISTORY — DX: Patellofemoral disorders, right knee: M22.2X1

## 2020-03-25 HISTORY — DX: Patellofemoral disorders, left knee: M22.2X2

## 2020-03-25 MED ORDER — DICLOFENAC SODIUM 1 % EX GEL: 2.0000 g | Freq: Four times a day (QID) | CUTANEOUS | 2 refills | Status: DC

## 2020-03-25 MED ORDER — DICLOFENAC SODIUM 3 % EX GEL: 1.0000 "application " | Freq: Two times a day (BID) | CUTANEOUS | 0 refills | Status: DC

## 2020-03-25 NOTE — Assessment & Plan Note (Signed)
Lab Results  Component Value Date   HIV1RNAQUANT 1,490 (H) 01/22/2020   Mentions not taking her Symtuza for a month. States she had been on [place] for couple years but had requested an alternative due to continued weight gain and headaches. After switching to [facility], had experienced intermittent chest tightness which prompted her to stop taking it. Since then her chest tight-ness has stopped. Denies any current fevers, chills, nausea, vomiting.  A/P Had prolonged conversation w/ Ms.Kirkeby regarding disease progression course for untreated HIV and importance of taking her anti-retrovirals. Reviewed recent lab results with concerning increasing viral load and importance of f/u with ID. Confirmed f/u with ID in October and stressed going to her f/u visit to discuss alternative anti-retroviral therapy if she continues to experience side effects. Ms.Crounse expressed understanding.  - F/u with ID

## 2020-03-25 NOTE — Assessment & Plan Note (Addendum)
Filed Weights   03/25/20 1336  Weight: (!) 377 lb 14.4 oz (171.4 kg)    Presenting w/ desire to lose weight. Mentions Anti-retrovirals as cause. Mentions eating lot of vegetables and fruits. Describes diet low in calories and states she is unsure she continues to be overweight.   A/P With BMI of 60, would meet criteria for bariatric surgery but high risk for infections due to non-adherence with HIV therapy. Had previously been seen by Lupita Leash with nutrition counseling but appears to have had no change in weight in last 6 months (374lb in April). Will make referral for medical weight management  - Referral for weight management - Encouraged to continue dietary changes and exercise

## 2020-03-25 NOTE — Progress Notes (Signed)
CC: Knee pain  HPI: Claudia Brady is a 59 y.o. with PMH listed below presenting with complaint of knee pain. Please see problem based assessment and plan for further details.  Past Medical History:  Diagnosis Date  . ATRIAL FIBRILLATION 08/20/2010   Qualifier: History of  By: Excell Seltzer CMA, Lawson Fiscal    . Chronic pain   . Duodenal ulcer Jan. 2012   Upper GI bleeding 2nd to NSAID use  . GERD (gastroesophageal reflux disease)   . HIV (human immunodeficiency virus infection) ([facility]) 2018  . Hypertension   . Iron deficiency anemia   . OSA (obstructive sleep apnea)   . Plantar fascia syndrome   . RESTLESS LEG SYNDROME 08/20/2010       . Rhinitis   . Varicose vein of leg    Review of Systems: Review of Systems  Constitutional: Negative for chills, fever and malaise/fatigue.  Eyes: Negative for blurred vision.  Respiratory: Negative for shortness of breath.   Cardiovascular: Negative for chest pain, palpitations and leg swelling.  Gastrointestinal: Negative for constipation, diarrhea, nausea and vomiting.  Genitourinary: Negative for dysuria.  Musculoskeletal: Positive for back pain and joint pain. Negative for falls.  Neurological: Positive for dizziness and headaches. Negative for focal weakness.     Physical Exam: Vitals:   03/25/20 1336  BP: (!) 165/105  Pulse: 78  SpO2: 97%  Weight: (!) 377 lb 14.4 oz (171.4 kg)   Gen: Well-developed, well nourished, NAD HEENT: NCAT head, hearing intact CV: RRR, S1, S2 normal Pulm: CTAB, No rales, no wheezes Extm: ROM intact, L anterior knee with crepitus, negative anterior/posterior drawer, negative [place]. No edema, warmth or tenderness on palpation of bilateral knees Skin: Dry, Warm, normal turgor  Assessment & Plan:   Patellofemoral arthralgia of both knees Claudia Brady is a 59 yo F w/ PMH of HTN, OSA, Morbid obesity and HIV presenting with complaint of bilateral knee pain. She mentions this is chronic and she has been having  worsening knee pain associated with walking and going up stairs. She mentions having worsening pain gradually as her weight increased. She states having some improvement with physical therapy but has been using supplemental medicine with OTC nSAIDs. Mentions that in the past she has used her sister's topical treatment 'that started with a D' with improvement as well.  A/P Presenting w/ bilateral knee pain. Physical exam consistent w/ patellofemoral arthralgia. With uncontrolled BP, advised against oral NSAID use. Will attempt treatment w/ topical NSAID as it appears to have provided symptom control in the past. Also advised to c/w physical therapy - Topical diclofenac ointment sent to pharmacy - C/w PT - Weight loss management  HTN (hypertension) BP Readings from Last 3 Encounters:  03/25/20 (!) 165/105  02/08/20 (!) 155/79  01/30/20 (!) 159/87   Above goal. Currently on losartan and metoprolol. Previously unable to tolerate amlodipine and HCTZ. Mentions not wanting to take medications as she is sensitive to side effects. Has had some NSAID use for arthralgias. Need to lose weight to improve bp. Mentions episodes of light-headedness while dressing herself this am when discussing additional anti-hypertensive regimen. Will hold off on adding anti-hypertensive for now.  - F/u with PCP - C/w losartan, metoprolol for now - Referral to weight loss clinic  Morbid obesity (HCC) Filed Weights   03/25/20 1336  Weight: (!) 377 lb 14.4 oz (171.4 kg)    Presenting w/ desire to lose weight. Mentions Anti-retrovirals as cause. Mentions eating lot of vegetables and fruits. Describes diet low  in calories and states she is unsure she continues to be overweight.   A/P With BMI of 60, would meet criteria for bariatric surgery but high risk for infections due to non-adherence with HIV therapy. Had previously been seen by Lupita Leash with nutrition counseling but appears to have had no change in weight in last 6  months (374lb in April). Will make referral for medical weight management  - Referral for weight management - Encouraged to continue dietary changes and exercise  Human immunodeficiency virus I infection (HCC) Lab Results  Component Value Date   HIV1RNAQUANT 1,490 (H) 01/22/2020   Mentions not taking her Symtuza for a month. States she had been on [place] for couple years but had requested an alternative due to continued weight gain and headaches. After switching to [facility], had experienced intermittent chest tightness which prompted her to stop taking it. Since then her chest tight-ness has stopped. Denies any current fevers, chills, nausea, vomiting.  A/P Had prolonged conversation w/ Claudia Brady regarding disease progression course for untreated HIV and importance of taking her anti-retrovirals. Reviewed recent lab results with concerning increasing viral load and importance of f/u with ID. Confirmed f/u with ID in October and stressed going to her f/u visit to discuss alternative anti-retroviral therapy if she continues to experience side effects. Claudia Brady expressed understanding.  - F/u with ID    Patient discussed with Dr. Oswaldo Done  -Claudia Brady, PGY3 [facility] Internal Medicine Pager: [phone]

## 2020-03-25 NOTE — Assessment & Plan Note (Addendum)
BP Readings from Last 3 Encounters:  03/25/20 (!) 165/105  02/08/20 (!) 155/79  01/30/20 (!) 159/87   Above goal. Currently on losartan and metoprolol. Previously unable to tolerate amlodipine and HCTZ. Mentions not wanting to take medications as she is sensitive to side effects. Has had some NSAID use for arthralgias. Need to lose weight to improve bp. Mentions episodes of light-headedness while dressing herself this am when discussing additional anti-hypertensive regimen. Will hold off on adding anti-hypertensive for now.  - F/u with PCP - C/w losartan, metoprolol for now - Referral to weight loss clinic

## 2020-03-25 NOTE — Assessment & Plan Note (Addendum)
Claudia Brady is a 59 yo F w/ PMH of HTN, OSA, Morbid obesity and HIV presenting with complaint of bilateral knee pain. She mentions this is chronic and she has been having worsening knee pain associated with walking and going up stairs. She mentions having worsening pain gradually as her weight increased. She states having some improvement with physical therapy but has been using supplemental medicine with OTC nSAIDs. Mentions that in the past she has used her sister's topical treatment 'that started with a D' with improvement as well.  A/P Presenting w/ bilateral knee pain. Physical exam consistent w/ patellofemoral arthralgia. With uncontrolled BP, advised against oral NSAID use. Will attempt treatment w/ topical NSAID as it appears to have provided symptom control in the past. Also advised to c/w physical therapy - Topical diclofenac ointment sent to pharmacy - C/w PT - Weight loss management

## 2020-03-25 NOTE — Patient Instructions (Addendum)
Thank you for allowing [place] to provide your care today. Today we discussed your knee pain    I have ordered no labs for you. I will call if any are abnormal.    Today we made the following changes to your medications.    I have sent a voltaren gel ointment for it. Please use it twice daily.  Please follow-up in 3 months.    Should you have any questions or concerns please call the internal medicine clinic at [phone].    Diclofenac skin gel What is this medicine? DICLOFENAC (dye KLOE fen ak) is a non-steroidal anti-inflammatory drug (NSAID). The 1% skin gel is used to treat osteoarthritis of the hands or knees. The 3% skin gel is used to treat actinic keratosis. This medicine may be used for other purposes; ask your health care provider or pharmacist if you have questions. COMMON BRAND NAME(S): DSG Pak, Omeca, Solaravix, Solaraze, Voltaren Arthritis, Voltaren Gel What should I tell my health care provider before I take this medicine? They need to know if you have any of these conditions:  asthma  bleeding problems  coronary artery bypass graft (CABG) surgery within the past 2 weeks  heart disease  high blood pressure  if you frequently drink alcohol containing drinks  kidney disease  liver disease  open or infected skin  stomach problems  an unusual or allergic reaction to diclofenac, aspirin, other NSAIDs, other medicines, benzyl alcohol (3% gel only), foods, dyes, or preservatives  pregnant or trying to get pregnant  breast-feeding How should I use this medicine? This medicine is for external use only. Follow the directions on the prescription label. Wash hands before and after use. Do not get this medicine in your eyes. If you do, rinse out with plenty of cool tap water. Use your doses at regular intervals. Do not use your medicine more often than directed. A special MedGuide will be given to you by the pharmacist with each prescription and refill of the 1% gel. Be  sure to read this information carefully each time. Talk to your pediatrician regarding the use of this medicine in children. Special care may be needed. The 3% gel is not approved for use in children. Overdosage: If you think you have taken too much of this medicine contact a poison control center or emergency room at once. NOTE: This medicine is only for you. Do not share this medicine with others. What if I miss a dose? If you miss a dose, use it as soon as you can. If it is almost time for your next dose, use only that dose. Do not use double or extra doses. What may interact with this medicine?  aspirin  NSAIDs, medicines for pain and inflammation, like ibuprofen or naproxen Do not use any other skin products without telling your doctor or health care professional. This list may not describe all possible interactions. Give your health care provider a list of all the medicines, herbs, non-prescription drugs, or dietary supplements you use. Also tell them if you smoke, drink alcohol, or use illegal drugs. Some items may interact with your medicine. What should I watch for while using this medicine? Tell your doctor or healthcare provider if your symptoms do not start to get better or if they get worse. You will need to follow up with your healthcare provider to monitor your progress. You may need to be treated for up to 3 months if you are using the 3% gel, but the full effect may  not occur until 1 month after stopping treatment. If you develop a severe skin reaction, contact your doctor or healthcare provider immediately. This medicine may cause serious skin reactions. They can happen weeks to months after starting the medicine. Contact your healthcare provider right away if you notice fevers or flu-like symptoms with a rash. The rash may be red or purple and then turn into blisters or peeling of the skin. Or, you might notice a red rash with swelling of the face, lips or lymph nodes in your neck  or under your arms. This medicine can make you more sensitive to the sun. Keep out of the sun. If you cannot avoid being in the sun, wear protective clothing and use sunscreen. Do not use sun lamps or tanning beds/booths. Do not take medicines such as ibuprofen and naproxen with this medicine. Side effects such as stomach upset, nausea, or ulcers may be more likely to occur. Many medicines available without a prescription should not be taken with this medicine. This medicine does not prevent heart attack or stroke. In fact, this medicine may increase the chance of a heart attack or stroke. The chance may increase with longer use of this medicine and in people who have heart disease. If you take aspirin to prevent heart attack or stroke, talk with your doctor or healthcare provider. This medicine can cause ulcers and bleeding in the stomach and intestines at any time during treatment. Do not smoke cigarettes or drink alcohol. These increase irritation to your stomach and can make it more susceptible to damage from this medicine. Ulcers and bleeding can happen without warning symptoms and can cause death. You may get drowsy or dizzy. Do not drive, use machinery, or do anything that needs mental alertness until you know how this medicine affects you. Do not stand or sit up quickly, especially if you are an older patient. This reduces the risk of dizzy or fainting spells. This medicine can cause you to bleed more easily. Try to avoid damage to your teeth and gums when you brush or floss your teeth. What side effects may I notice from receiving this medicine? Side effects that you should report to your doctor or health care professional as soon as possible:  allergic reactions like skin rash, itching or hives, swelling of the face, lips, or tongue  black or bloody stools, blood in the urine or vomit  blurred vision  chest pain  difficulty breathing or wheezing  nausea or vomiting  rash, fever, and  swollen lymph nodes  redness, blistering, peeling or loosening of the skin, including inside the mouth  slurred speech or weakness on one side of the body  trouble passing urine or change in the amount of urine  unexplained weight gain or swelling  unusually weak or tired  yellowing of eyes or skin Side effects that usually do not require medical attention (report to your doctor or health care professional if they continue or are bothersome):  dizziness  dry skin  headache  heartburn  increased sensitivity to the sun  stomach pain  tingling at the application site This list may not describe all possible side effects. Call your doctor for medical advice about side effects. You may report side effects to FDA at 1-800-FDA-1088. Where should I keep my medicine? Keep out of the reach of children. Store the 1% gel at room temperature between 15 and 30 degrees C (59 and 86 degrees F). Store the 3% gel at room temperature between 20  and 25 degrees C (68 and 77 degrees F). Protect from light. Throw away any unused medicine after the expiration date. NOTE: This sheet is a summary. It may not cover all possible information. If you have questions about this medicine, talk to your doctor, pharmacist, or health care provider.  2020 Elsevier/Gold Standard (2018-09-14 13:05:18)

## 2020-03-26 NOTE — Progress Notes (Signed)
Internal Medicine Clinic Attending  Case discussed with Dr. Lee  At the time of the visit.  We reviewed the resident's history and exam and pertinent patient test results.  I agree with the assessment, diagnosis, and plan of care documented in the resident's note.    

## 2020-03-27 ENCOUNTER — Telehealth: Payer: Self-pay

## 2020-03-27 NOTE — Telephone Encounter (Signed)
Patient contacted office today stating she has stopped taking her medication for 3 weeks. States she felt like medication was causing her chest pain. Will schedule appointment with Pharmacy team.  Lorenso Courier, CMA

## 2020-03-28 ENCOUNTER — Ambulatory Visit: Payer: Medicare Other | Admitting: Pharmacist

## 2020-04-01 ENCOUNTER — Encounter (INDEPENDENT_AMBULATORY_CARE_PROVIDER_SITE_OTHER): Payer: Self-pay

## 2020-04-01 NOTE — Telephone Encounter (Signed)
FYI

## 2020-04-02 ENCOUNTER — Ambulatory Visit (INDEPENDENT_AMBULATORY_CARE_PROVIDER_SITE_OTHER): Payer: Medicare Other | Admitting: Pharmacist

## 2020-04-02 ENCOUNTER — Other Ambulatory Visit: Payer: Self-pay

## 2020-04-02 ENCOUNTER — Ambulatory Visit: Payer: Medicare Other | Admitting: Pharmacist

## 2020-04-02 DIAGNOSIS — B20 Human immunodeficiency virus [HIV] disease: Secondary | ICD-10-CM | POA: Diagnosis not present

## 2020-04-02 MED ORDER — DELSTRIGO 100-300-300 MG PO TABS: 1.0000 | ORAL_TABLET | Freq: Every day | ORAL | 5 refills | Status: DC

## 2020-04-02 NOTE — Progress Notes (Signed)
HPI: Claudia Brady is a 59 y.o. female who presents to the [facility] pharmacy clinic for HIV follow-up.  Patient Active Problem List   Diagnosis Date Noted  . Patellofemoral arthralgia of both knees 03/25/2020  . Shoulder injury 01/31/2020  . Hemorrhoid 04/05/2019  . Healthcare maintenance 04/05/2019  . Migraine 09/17/2018  . Venous insufficiency 09/16/2018  . Abnormal uterine bleeding (AUB) 02/04/2018  . Morbid obesity (HCC) 04/19/2017  . 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 HTN (hypertension) 10/23/2016  . Human immunodeficiency virus I infection (HCC) 10/02/2016  . Poor dentition 10/02/2016  . Depression 11/28/2010  . Obstructive sleep apnea 08/20/2010    Patient's Medications  New Prescriptions   No medications on file  Previous Medications   DARUNAVIR-COBICISCTAT-EMTRICITABINE-TENOFOVIR ALAFENAMIDE (SYMTUZA) 800-150-200-10 MG TABS    Take 1 tablet by mouth daily with breakfast.   DICLOFENAC SODIUM (VOLTAREN) 1 % GEL    Apply 2 g topically 4 (four) times daily.   DULOXETINE (CYMBALTA) 30 MG CAPSULE    Take 2 tablets per day   LOSARTAN (COZAAR) 50 MG TABLET    TAKE 1 TABLET(50 MG) BY MOUTH DAILY   METOPROLOL TARTRATE (LOPRESSOR) 25 MG TABLET    TAKE 1 TABLET(25 MG) BY MOUTH TWICE DAILY   MULTIPLE VITAMIN (MULTIVITAMIN WITH MINERALS) TABS TABLET    Take 1 tablet by mouth daily.    OMEPRAZOLE (PRILOSEC) 20 MG CAPSULE    Take 1 capsule (20 mg total) by mouth daily.  Modified Medications   No medications on file  Discontinued Medications   No medications on file    Allergies: Allergies  Allergen Reactions  . Ace Inhibitors Cough  . Other Rash    Patient states she is allergic to sanitary napkins.  . Penicillins Swelling and Rash    States she has taken amoxicillin without difficulty.     Past Medical History: Past Medical History:  Diagnosis Date  . ATRIAL FIBRILLATION 08/20/2010   Qualifier: History of  By: Excell Seltzer CMA, Lawson Fiscal    . Chronic pain   . Duodenal ulcer Jan. 2012    Upper GI bleeding 2nd to NSAID use  . GERD (gastroesophageal reflux disease)   . HIV (human immunodeficiency virus infection) (HCC) 2018  . Hypertension   . Iron deficiency anemia   . OSA (obstructive sleep apnea)   . Plantar fascia syndrome   . RESTLESS LEG SYNDROME 08/20/2010       . Rhinitis   . Varicose vein of leg     Social History: Social History   Socioeconomic History  . Marital status: Single    Spouse name: Not on file  . Number of children: Not on file  . Years of education: Not on file  . Highest education level: Not on file  Occupational History  . Not on file  Tobacco Use  . Smoking status: Former Smoker    Years: 15.00    Types: Cigarettes    Quit date: 12/22/2015    Years since quitting: 4.2  . Smokeless tobacco: Never Used  Vaping Use  . Vaping Use: Former  Substance and Sexual Activity  . Alcohol use: Yes    Comment: occ  . Drug use: No  . Sexual activity: Never  Other Topics Concern  . Not on file  Social History Narrative  . Not on file   Social Determinants of Health   Financial Resource Strain:   . Difficulty of Paying Living Expenses: Not on file  Food Insecurity:   . Worried About  Running Out of Food in the Last Year: Not on file  . Ran Out of Food in the Last Year: Not on file  Transportation Needs:   . Lack of Transportation (Medical): Not on file  . Lack of Transportation (Non-Medical): Not on file  Physical Activity:   . Days of Exercise per Week: Not on file  . Minutes of Exercise per Session: Not on file  Stress:   . Feeling of Stress : Not on file  Social Connections:   . Frequency of Communication with Friends and Family: Not on file  . Frequency of Social Gatherings with Friends and Family: Not on file  . Attends Religious Services: Not on file  . Active Member of Clubs or Organizations: Not on file  . Attends Banker Meetings: Not on file  . Marital Status: Not on file    Labs: Lab Results  Component  Value Date   HIV1RNAQUANT 1,490 (H) 01/22/2020   HIV1RNAQUANT 103 (H) 01/17/2020   HIV1RNAQUANT <20 NOT DETECTED 05/04/2019   CD4TABS 834 01/17/2020   CD4TABS 776 05/04/2019   CD4TABS 730 07/18/2018    RPR and STI Lab Results  Component Value Date   LABRPR NON-REACTIVE 07/18/2018   LABRPR NON REAC 10/02/2016    STI Results GC CT  07/18/2018 Negative Negative  02/04/2018 Negative Negative  01/03/2018 Negative Negative  10/23/2016 Negative Negative  10/02/2016 Negative Negative    Hepatitis B Lab Results  Component Value Date   HEPBSAB NEG 10/02/2016   HEPBSAG NEGATIVE 10/02/2016   Hepatitis C No results found for: HEPCAB, HCVRNAPCRQN Hepatitis A Lab Results  Component Value Date   HAV REACTIVE (A) 10/02/2016   Lipids: Lab Results  Component Value Date   CHOL 170 07/18/2018   TRIG 99 07/18/2018   HDL 65 07/18/2018   CHOLHDL 2.6 07/18/2018   VLDL 16 10/02/2016   LDLCALC 86 07/18/2018    Current HIV Regimen: Symtuza - stopped ~3 weeks ago  Assessment: Claudia Brady is here today to follow up for her HIV infection and to discuss side effects related to her Symtuza.  She was prescribed Symtuza back in July and started it around August 25th. She is complaining of chest tightness and chest pain when taking it. She took it for about 10-12 days and had these symptoms with each dose. She stopped taking it about 3 weeks ago and the issues resolved. She suffers from anxiety as well.   Before she was prescribed Symtuza, she was taking Biktarvy. She tolerated Biktarvy very well but had rapid weight gain while taking it. She used to be around 200 lbs but is almost 400 lbs at this time. She was referred to a weight management clinic and will be starting there in October.   She does not wish to take another medication that will cause weight gain, so all integrase medications are not an option ([place], Triumeq, Dovato, [place], [place], etc). She also does not wish to take another  medication like Symtuza, so protease inhibitors are not an option. She does not want to take multiple pills per day and wants to stay on a one pill once daily regimen.   After discussion with patient, her single tablet regimen options were Odefsey or Delstrigo. She is prescribed a PPI, which is contraindicated with Odefsey, but states that she does not necessarily need to take it. Also explained that [facility] must be taken with a full meal.  We decided to have her try Delstrigo for now and if  that does not work for her or is not covered by her insurance, we can try Odefsey.   Counseled on how to take Delstrigo - one tablet once daily with or without food. Asked her to call me if she has any issues getting it or taking it. She has a follow up appointment with Dr. Drue Second in 3 weeks which will be perfect timing to check in. All questions answered.   Plan: - Start Delstrigo one tablet daily - F/u with Dr. Drue Second on 10/12  Zamar Odwyer L. Monesha Monreal, PharmD, BCIDP, AAHIVP, CPP Clinical Pharmacist Practitioner Infectious Diseases Clinical Pharmacist [facility] 04/02/2020, 1:53 PM

## 2020-04-05 ENCOUNTER — Other Ambulatory Visit: Payer: Self-pay

## 2020-04-05 ENCOUNTER — Encounter: Payer: Self-pay | Admitting: Physical Therapy

## 2020-04-05 ENCOUNTER — Ambulatory Visit: Payer: Medicare Other | Admitting: Physical Therapy

## 2020-04-05 DIAGNOSIS — M6281 Muscle weakness (generalized): Secondary | ICD-10-CM | POA: Diagnosis not present

## 2020-04-05 DIAGNOSIS — M25511 Pain in right shoulder: Secondary | ICD-10-CM

## 2020-04-05 DIAGNOSIS — M25611 Stiffness of right shoulder, not elsewhere classified: Secondary | ICD-10-CM | POA: Diagnosis not present
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
## 2020-04-08 ENCOUNTER — Ambulatory Visit: Payer: Medicare Other | Admitting: Pharmacist

## 2020-04-09 ENCOUNTER — Ambulatory Visit: Payer: Medicare Other | Admitting: Physical Therapy

## 2020-04-15 NOTE — Addendum Note (Signed)
Addended by: Neomia Dear on: 04/15/2020 06:56 AM   Modules accepted: Orders

## 2020-04-16 ENCOUNTER — Encounter: Payer: Medicare Other | Admitting: Physical Therapy

## 2020-04-18 ENCOUNTER — Ambulatory Visit (INDEPENDENT_AMBULATORY_CARE_PROVIDER_SITE_OTHER): Payer: Self-pay | Admitting: Family Medicine

## 2020-04-23 ENCOUNTER — Ambulatory Visit (INDEPENDENT_AMBULATORY_CARE_PROVIDER_SITE_OTHER): Payer: Medicare Other | Admitting: Internal Medicine

## 2020-04-23 ENCOUNTER — Other Ambulatory Visit: Payer: Self-pay

## 2020-04-23 VITALS — BP 179/107 | HR 80 | Temp 97.9°F | Wt 379.0 lb

## 2020-04-23 DIAGNOSIS — Z0000 Encounter for general adult medical examination without abnormal findings: Secondary | ICD-10-CM | POA: Diagnosis not present

## 2020-04-23 DIAGNOSIS — G44019 Episodic cluster headache, not intractable: Secondary | ICD-10-CM

## 2020-04-23 DIAGNOSIS — Z79899 Other long term (current) drug therapy: Secondary | ICD-10-CM | POA: Diagnosis not present

## 2020-04-23 DIAGNOSIS — Z23 Encounter for immunization: Secondary | ICD-10-CM

## 2020-04-23 DIAGNOSIS — B20 Human immunodeficiency virus [HIV] disease: Secondary | ICD-10-CM | POA: Diagnosis not present

## 2020-04-23 NOTE — Progress Notes (Signed)
Patient ID: Claudia Brady, female   DOB: Apr 02, 1961, 59 y.o.   MRN: [id number]  HPI Has been on delstrigo thus far and doing okay. Has headache side effect (has had it with biktarvy)   symtuza- gave her chest pain/gerd and stopped taking the meds was switched to delstrigo for which she is tolerating  She is working with diet education for weight loss   Outpatient Encounter Medications as of 04/23/2020  Medication Sig   diclofenac Sodium (VOLTAREN) 1 % GEL Apply 2 g topically 4 (four) times daily.   doravirin-lamivudin-tenofov df (DELSTRIGO) 100-300-300 MG TABS per tablet Take 1 tablet by mouth daily.   DULoxetine (CYMBALTA) 30 MG capsule Take 2 tablets per day (Patient not taking: Reported on 01/22/2020)   losartan (COZAAR) 50 MG tablet TAKE 1 TABLET(50 MG) BY MOUTH DAILY   Multiple Vitamin (MULTIVITAMIN WITH MINERALS) TABS tablet Take 1 tablet by mouth daily.    metoprolol tartrate (LOPRESSOR) 25 MG tablet TAKE 1 TABLET(25 MG) BY MOUTH TWICE DAILY (Patient not taking: Reported on 01/22/2020)   omeprazole (PRILOSEC) 20 MG capsule Take 1 capsule (20 mg total) by mouth daily. (Patient not taking: Reported on 01/22/2020)   No facility-administered encounter medications on file as of 04/23/2020.     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  HENT: Negative for congestion, sore throat, rhinorrhea, sneezing, trouble swallowing and sinus pressure.  Eyes: Negative for photophobia and visual disturbance.  Respiratory: Negative for cough, chest tightness, shortness of breath, wheezing and stridor.  Cardiovascular: Negative for chest pain, palpitations and leg swelling.  Gastrointestinal: Negative for nausea, vomiting, abdominal pain, diarrhea, constipation, blood in stool, abdominal distention and anal bleeding.  Genitourinary: Negative for dysuria, hematuria, flank pain and difficulty urinating.  Musculoskeletal: Negative for myalgias, back pain, joint swelling, arthralgias and gait problem.  Skin: Negative for color change, pallor, rash and wound.  Neurological: Negative for dizziness, tremors, weakness and light-headedness.  Hematological: Negative for adenopathy. Does not bruise/bleed easily.  Psychiatric/Behavioral:+headache occasionally   Physical Exam   BP (!) 179/107    Pulse 80    Temp 97.9 F (36.6 C) (Oral)    Wt (!) 379 lb (171.9 kg)    BMI 62.30 kg/m   Physical Exam  Constitutional:  oriented to person, place, and time. appears well-developed and well-nourished. No distress.  HENT: [place]/AT, PERRLA, no scleral icterus Mouth/Throat: Oropharynx is clear and moist. No oropharyngeal exudate.  Cardiovascular: Normal rate, regular rhythm and normal heart sounds. Exam reveals no gallop and no friction rub.  No murmur heard.  Pulmonary/Chest: Effort normal and breath sounds normal. No respiratory distress.  has no wheezes.  Neck = supple, no nuchal rigidity Abdominal: Soft. Bowel sounds are  normal.  exhibits no distension. There is no tenderness.  Lymphadenopathy: no cervical adenopathy. No axillary adenopathy Neurological: alert and oriented to  person, place, and time.  Skin: Skin is warm and dry. No rash noted. No erythema.  Psychiatric: a normal mood and affect.  behavior is normal.   Lab Results  Component Value Date   CD4TCELL 46 01/17/2020   Lab Results  Component Value Date   CD4TABS 834 01/17/2020   CD4TABS 776 05/04/2019   CD4TABS 730 07/18/2018   Lab Results  Component Value Date   HIV1RNAQUANT 1,490 (H) 01/22/2020   Lab Results  Component Value Date   HEPBSAB NEG 10/02/2016   Lab Results  Component Value Date   LABRPR NON-REACTIVE 07/18/2018    CBC Lab Results  Component Value Date   WBC 5.6 01/17/2020   RBC 4.05 01/17/2020   HGB 11.5 (L) 01/17/2020   HCT 35.3 01/17/2020   PLT 278 01/17/2020   MCV 87.2 01/17/2020   MCH 28.4 01/17/2020   MCHC 32.6 01/17/2020   RDW 14.4 01/17/2020   LYMPHSABS 1,792 01/17/2020   MONOABS 0.5 03/17/2014   EOSABS 213 01/17/2020    BMET Lab Results  Component Value Date   NA 139 01/17/2020   K 4.8 01/17/2020   CL 103 01/17/2020   CO2 30 01/17/2020   GLUCOSE 82 01/17/2020   BUN 11 01/17/2020   CREATININE 0.84 01/17/2020   CALCIUM 8.9 01/17/2020   GFRNONAA 77 01/17/2020   GFRAA 89 01/17/2020      Assessment and Plan  hiv disease = switched to delstrigo last visit. Will check labs  Headache = takes prn tylenol  Long term medication management = cr is stable  Health maintenance = needs flu vaccine and pfizer booster

## 2020-04-24 ENCOUNTER — Telehealth: Payer: Self-pay | Admitting: *Deleted

## 2020-04-24 LAB — T-HELPER CELL (CD4) - (RCID CLINIC ONLY)
CD4 % Helper T Cell: 47 % (ref 33–65)
CD4 T Cell Abs: 881 /uL (ref 400–1790)

## 2020-04-24 NOTE — Telephone Encounter (Signed)
Patietn called because [facility] notified her the refill of symtuza was ready. She was switched to [facility] 04/02/20.  RN called pharmacy, cancelled symtuza, asked for delstrigo refill. Andree Coss, RN

## 2020-04-25 ENCOUNTER — Encounter: Payer: Medicare Other | Admitting: Internal Medicine

## 2020-04-25 LAB — HIV-1 RNA QUANT-NO REFLEX-BLD
HIV 1 RNA Quant: <20 Copies/mL
HIV-1 RNA Quant, Log: <1.3 Log cps/mL

## 2020-05-02 ENCOUNTER — Ambulatory Visit (INDEPENDENT_AMBULATORY_CARE_PROVIDER_SITE_OTHER): Payer: Self-pay | Admitting: Family Medicine

## 2020-05-18 ENCOUNTER — Other Ambulatory Visit: Payer: Self-pay | Admitting: Internal Medicine

## 2020-05-18 DIAGNOSIS — F419 Anxiety disorder, unspecified: Secondary | ICD-10-CM

## 2020-07-23 ENCOUNTER — Ambulatory Visit: Payer: Medicare Other | Admitting: Internal Medicine

## 2020-08-15 ENCOUNTER — Encounter: Payer: Self-pay | Admitting: Internal Medicine

## 2020-08-15 ENCOUNTER — Other Ambulatory Visit: Payer: Self-pay

## 2020-08-15 ENCOUNTER — Ambulatory Visit (INDEPENDENT_AMBULATORY_CARE_PROVIDER_SITE_OTHER): Payer: Medicare Other | Admitting: Internal Medicine

## 2020-08-15 VITALS — BP 194/106 | HR 81 | Temp 98.0°F | Ht 65.25 in | Wt 382.0 lb

## 2020-08-15 DIAGNOSIS — I10 Essential (primary) hypertension: Secondary | ICD-10-CM | POA: Diagnosis not present

## 2020-08-15 DIAGNOSIS — Z79899 Other long term (current) drug therapy: Secondary | ICD-10-CM | POA: Diagnosis not present

## 2020-08-15 DIAGNOSIS — R635 Abnormal weight gain: Secondary | ICD-10-CM

## 2020-08-15 DIAGNOSIS — B20 Human immunodeficiency virus [HIV] disease: Secondary | ICD-10-CM | POA: Diagnosis not present

## 2020-08-15 MED ORDER — DELSTRIGO 100-300-300 MG PO TABS: 1.0000 | ORAL_TABLET | Freq: Every day | ORAL | 5 refills | Status: DC

## 2020-08-15 NOTE — Progress Notes (Signed)
RFV: follow up for hiv disease  Patient ID: Claudia Brady, female   DOB: [birth year], 60 y.o.   MRN: [id number]  HPI 59yo F with well controlled HIV disease. Taking delstrigo (doravarine-lamivudine-TDF) daily. Not having headaches associated with medication (unlike biktarvy)  Eating habits has changed. motts fruit snacks for her sugar fix.  Outpatient Encounter Medications as of 08/15/2020  Medication Sig  . Bioflavonoid Products (VITAMIN C) CHEW Chew by mouth.  Marland Kitchen BIOTIN PO Take by mouth.  . Cholecalciferol (VITAMIN D) 50 MCG (2000 UT) CAPS Take by mouth.  . diclofenac Sodium (VOLTAREN) 1 % GEL Apply 2 g topically 4 (four) times daily.  . doravirin-lamivudin-tenofov df (DELSTRIGO) 100-300-300 MG TABS per tablet Take 1 tablet by mouth daily.  . DULoxetine (CYMBALTA) 30 MG capsule TAKE 2 CAPSULES BY MOUTH DAILY  . losartan (COZAAR) 50 MG tablet TAKE 1 TABLET(50 MG) BY MOUTH DAILY  . metoprolol tartrate (LOPRESSOR) 25 MG tablet TAKE 1 TABLET(25 MG) BY MOUTH TWICE DAILY  . Multiple Vitamin (MULTIVITAMIN WITH MINERALS) TABS tablet Take 1 tablet by mouth daily.   . [DISCONTINUED] omeprazole (PRILOSEC) 20 MG capsule Take 1 capsule (20 mg total) by mouth daily. (Patient not taking: Reported on 01/22/2020)   No facility-administered encounter medications on file as of 08/15/2020.     Patient Active Problem List   Diagnosis Date Noted  . Patellofemoral arthralgia of both knees 03/25/2020  . Shoulder injury 01/31/2020  . Hemorrhoid 04/05/2019  . Healthcare maintenance 04/05/2019  . Migraine 09/17/2018  . Venous insufficiency 09/16/2018  . Abnormal uterine bleeding (AUB) 02/04/2018  . Morbid obesity (HCC) 04/19/2017  . 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 HTN (hypertension) 10/23/2016  . Human immunodeficiency virus I infection (HCC) 10/02/2016  . Poor dentition 10/02/2016  . Depression 11/28/2010  . Obstructive sleep apnea 08/20/2010     Health Maintenance Due  Topic Date Due  . PAP  SMEAR-Modifier  02/05/2019  . MAMMOGRAM  05/21/2019     Review of Systems Review of Systems  Constitutional: Negative for fever, chills, diaphoresis, activity change, appetite change, fatigue and unexpected weight change.  HENT: Negative for congestion, sore throat, rhinorrhea, sneezing, trouble swallowing and sinus pressure.  Eyes: Negative for photophobia and visual disturbance.  Respiratory: Negative for cough, chest tightness, shortness of breath, wheezing and stridor.  Cardiovascular: Negative for chest pain, palpitations and leg swelling.  Gastrointestinal: Negative for nausea, vomiting, abdominal pain, diarrhea, constipation, blood in stool, abdominal distention and anal bleeding.  Genitourinary: Negative for dysuria, hematuria, flank pain and difficulty urinating.  Musculoskeletal: Negative for myalgias, back pain, joint swelling, arthralgias and gait problem.  Skin: Negative for color change, pallor, rash and wound.  Neurological: Negative for dizziness, tremors, weakness and light-headedness.  Hematological: Negative for adenopathy. Does not bruise/bleed easily.  Psychiatric/Behavioral: Negative for behavioral problems, confusion, sleep disturbance, dysphoric mood, decreased concentration and agitation.    Physical Exam   BP (!) 194/106   Pulse 81   Temp 98 F (36.7 C) (Oral)   Ht 5' 5.25" (1.657 m)   Wt (!) 382 lb (173.3 kg)   SpO2 95%   BMI 63.08 kg/m   Physical Exam  Constitutional:  oriented to person, place, and time. appears well-developed and well-nourished. No distress.  HENT: [place]/AT, PERRLA, no scleral icterus Mouth/Throat: Oropharynx is clear and moist. No oropharyngeal exudate.  Cardiovascular: Normal rate, regular rhythm and normal heart sounds. Exam reveals no gallop and no friction rub.  No murmur heard.  Pulmonary/Chest: Effort normal and breath  sounds normal. No respiratory distress.  has no wheezes.  Neck = supple, no nuchal rigidity Abdominal: Soft.  Bowel sounds are normal.  exhibits no distension. There is no tenderness.  Lymphadenopathy: no cervical adenopathy. No axillary adenopathy Neurological: alert and oriented to person, place, and time.  Skin: Skin is warm and dry. No rash noted. No erythema.  Psychiatric: a normal mood and affect.  behavior is normal.   Lab Results  Component Value Date   CD4TCELL 47 04/23/2020   Lab Results  Component Value Date   CD4TABS 881 04/23/2020   CD4TABS 834 01/17/2020   CD4TABS 776 05/04/2019   Lab Results  Component Value Date   HIV1RNAQUANT <20 04/23/2020   Lab Results  Component Value Date   HEPBSAB NEG 10/02/2016   Lab Results  Component Value Date   LABRPR NON-REACTIVE 07/18/2018    CBC Lab Results  Component Value Date   WBC 5.6 01/17/2020   RBC 4.05 01/17/2020   HGB 11.5 (L) 01/17/2020   HCT 35.3 01/17/2020   PLT 278 01/17/2020   MCV 87.2 01/17/2020   MCH 28.4 01/17/2020   MCHC 32.6 01/17/2020   RDW 14.4 01/17/2020   LYMPHSABS 1,792 01/17/2020   MONOABS 0.5 03/17/2014   EOSABS 213 01/17/2020    BMET Lab Results  Component Value Date   NA 139 01/17/2020   K 4.8 01/17/2020   CL 103 01/17/2020   CO2 30 01/17/2020   GLUCOSE 82 01/17/2020   BUN 11 01/17/2020   CREATININE 0.84 01/17/2020   CALCIUM 8.9 01/17/2020   GFRNONAA 77 01/17/2020   GFRAA 89 01/17/2020      Assessment and Plan  HIV disease= well controlled in October 2021. Lets do labs today  Long term medication management = will check cr  Hypertension = hasn't take BP meds ( usually runs 130/80s) -- maybe high due to being in doctor's office  Weight = unchanged, needs to do exercise. Has plan for exercise next week!  Up todate to vaccine  Health maintenance = needs mammo.hesistant.

## 2020-08-16 LAB — T-HELPER CELL (CD4) - (RCID CLINIC ONLY)
CD4 % Helper T Cell: 49 % (ref 33–65)
CD4 T Cell Abs: 810 /uL (ref 400–1790)

## 2020-08-19 LAB — CBC WITH DIFFERENTIAL/PLATELET
Absolute Monocytes: 451 cells/uL (ref 200–950)
Basophils Absolute: 39 cells/uL (ref 0–200)
Basophils Relative: 0.7 %
Eosinophils Absolute: 138 cells/uL (ref 15–500)
Eosinophils Relative: 2.5 %
HCT: 35.5 % (ref 35.0–45.0)
Hemoglobin: 12.1 g/dL (ref 11.7–15.5)
Lymphs Abs: 1711 cells/uL (ref 850–3900)
MCH: 31.3 pg (ref 27.0–33.0)
MCHC: 34.1 g/dL (ref 32.0–36.0)
MCV: 91.7 fL (ref 80.0–100.0)
MPV: 10.9 fL (ref 7.5–12.5)
Monocytes Relative: 8.2 %
Neutro Abs: 3163 cells/uL (ref 1500–7800)
Neutrophils Relative %: 57.5 %
Platelets: 276 10*3/uL (ref 140–400)
RBC: 3.87 10*6/uL (ref 3.80–5.10)
RDW: 15.7 % — ABNORMAL HIGH (ref 11.0–15.0)
Total Lymphocyte: 31.1 %
WBC: 5.5 10*3/uL (ref 3.8–10.8)

## 2020-08-19 LAB — COMPLETE METABOLIC PANEL WITH GFR
AG Ratio: 1.3 (calc) (ref 1.0–2.5)
ALT: 14 U/L (ref 6–29)
AST: 20 U/L (ref 10–35)
Albumin: 4.1 g/dL (ref 3.6–5.1)
Alkaline phosphatase (APISO): 87 U/L (ref 37–153)
BUN: 11 mg/dL (ref 7–25)
CO2: 30 mmol/L (ref 20–32)
Calcium: 9.2 mg/dL (ref 8.6–10.4)
Chloride: 101 mmol/L (ref 98–110)
Creat: 0.76 mg/dL (ref 0.50–1.05)
GFR, Est African American: 100 mL/min/{1.73_m2} (ref >=60)
GFR, Est Non African American: 86 mL/min/{1.73_m2} (ref >=60)
Globulin: 3.2 g/dL (calc) (ref 1.9–3.7)
Glucose, Bld: 84 mg/dL (ref 65–99)
Potassium: 4.5 mmol/L (ref 3.5–5.3)
Sodium: 139 mmol/L (ref 135–146)
Total Bilirubin: 0.4 mg/dL (ref 0.2–1.2)
Total Protein: 7.3 g/dL (ref 6.1–8.1)

## 2020-08-19 LAB — LIPID PANEL
Cholesterol: 144 mg/dL (ref <200)
HDL: 50 mg/dL (ref >=50)
LDL Cholesterol (Calc): 74 mg/dL (calc)
Non-HDL Cholesterol (Calc): 94 mg/dL (calc) (ref <130)
Total CHOL/HDL Ratio: 2.9 (calc) (ref <5.0)
Triglycerides: 114 mg/dL (ref <150)

## 2020-08-19 LAB — HIV-1 RNA QUANT-NO REFLEX-BLD
HIV 1 RNA Quant: <20 Copies/mL — ABNORMAL HIGH
HIV-1 RNA Quant, Log: <1.3 Log cps/mL — ABNORMAL HIGH

## 2020-08-19 LAB — RPR: RPR Ser Ql: NONREACTIVE

## 2020-08-26 ENCOUNTER — Encounter: Payer: Medicare Other | Admitting: Internal Medicine

## 2020-09-13 ENCOUNTER — Telehealth: Payer: Self-pay | Admitting: Internal Medicine

## 2020-09-13 DIAGNOSIS — I10 Essential (primary) hypertension: Secondary | ICD-10-CM

## 2020-09-13 MED ORDER — LOSARTAN POTASSIUM 50 MG PO TABS: 100.0000 mg | ORAL_TABLET | Freq: Every day | ORAL | 5 refills | Status: DC

## 2020-09-13 NOTE — Telephone Encounter (Signed)
   Reason for call:   I received a call from [name] at 1 PM indicating clinical question regarding her blood pressure.   Pertinent Data:   Received call from [name] who states that she noticed that her blood pressure was 190 systolic overnight and took an extra dose of her losartan. She mentions that she re-checked her bp this morning and it has come down to 150s but is concerned regarding re-elevation of her blood pressure and she asks if she can increase her losartan dose to 100mg . She denies any significant symptoms at th emoment. Denies any chest pain, blurry vision, dyspnea, lower extremity edema   Assessment / Plan / Recommendations:   Present with uncontrolled hypertension. Currently only on losartan. Need appointment in clinic to address management of her bp. Message sent to front desk to set appointment. Advised [name] to continue monitoring her bp and continue with losartan 100mg  daily but she needs to come in for lab check. [name] expressed understanding.  As always, pt is advised that if symptoms worsen or new symptoms arise, they should go to an urgent care facility or to to ER for further evaluation.   , MD   09/13/2020, 1:06 PM

## 2020-09-17 ENCOUNTER — Ambulatory Visit (INDEPENDENT_AMBULATORY_CARE_PROVIDER_SITE_OTHER): Payer: Medicare Other | Admitting: Internal Medicine

## 2020-09-17 VITALS — BP 158/100 | HR 74 | Wt 383.5 lb

## 2020-09-17 DIAGNOSIS — I10 Essential (primary) hypertension: Secondary | ICD-10-CM | POA: Diagnosis not present

## 2020-09-17 DIAGNOSIS — M222X1 Patellofemoral disorders, right knee: Secondary | ICD-10-CM | POA: Diagnosis not present

## 2020-09-17 DIAGNOSIS — M222X2 Patellofemoral disorders, left knee: Secondary | ICD-10-CM

## 2020-09-17 DIAGNOSIS — G4733 Obstructive sleep apnea (adult) (pediatric): Secondary | ICD-10-CM

## 2020-09-17 DIAGNOSIS — G43809 Other migraine, not intractable, without status migrainosus: Secondary | ICD-10-CM

## 2020-09-17 MED ORDER — LABETALOL HCL 100 MG PO TABS: 100.0000 mg | ORAL_TABLET | Freq: Two times a day (BID) | ORAL | 0 refills | Status: DC

## 2020-09-17 NOTE — Progress Notes (Signed)
° °  CC: hypertension   HPI:  Ms.Claritza Koker is a 60 y.o. with PMH as below.   Please see A&P for assessment of the patient's acute and chronic medical conditions.    Past Medical History:  Diagnosis Date   ATRIAL FIBRILLATION 08/20/2010   Qualifier: History of  By: Excell Seltzer CMA, Lori     Chronic pain    Duodenal ulcer Jan. 2012   Upper GI bleeding 2nd to NSAID use   GERD (gastroesophageal reflux disease)    HIV (human immunodeficiency virus infection) (HCC) 2018   Hypertension    Iron deficiency anemia    OSA (obstructive sleep apnea)    Plantar fascia syndrome    RESTLESS LEG SYNDROME 08/20/2010        Rhinitis    Varicose vein of leg    Review of Systems:   Review of Systems  Constitutional: Negative for chills, fever, malaise/fatigue and weight loss.  HENT: Negative for sore throat.   Respiratory: Positive for shortness of breath. Negative for cough and wheezing.   Cardiovascular: Negative for chest pain, palpitations and leg swelling.  Gastrointestinal: Negative for abdominal pain, constipation, diarrhea and nausea.  Genitourinary: Negative for dysuria, frequency and urgency.  Musculoskeletal: Positive for joint pain. Negative for falls.  Neurological: Negative for dizziness and headaches.   Physical Exam:   Constitution: NAD, appears stated age, morbidly obese HENT: [place]/AT Cardio: RRR, no m/r/g, no LE edema  Respiratory: CTA, no w/r/r Abdominal: NTTP, soft, non-distended MSK: moving all extremities Neuro: normal affect, a&ox3, pleasant  Skin: c/d/i    Vitals:   09/17/20 1507  BP: (!) 153/103  Pulse: 75  SpO2: 96%  Weight: (!) 383 lb 8 oz (174 kg)    Assessment & Plan:   See Encounters Tab for problem based charting.  Patient discussed with Dr. Mayford Knife

## 2020-09-17 NOTE — Patient Instructions (Addendum)
Thank you for allowing [place] to provide your care today. Today we discussed your hypertension and obesity.   Today we made the following changes to your medications:   Please START taking  Labetalol 100 mg - take one tablet two times per day   Please STOP taking   Metoprolol   Please follow-up in one week for blood pressure check.      I have placed an order for Bipap machine.   I have placed and order for a left knee brace. They will call you to arrange a fitting.   I have placed an order for Physical Therapy. They will call you with an appointment.   Please download Myfitnesspal and use this to track your food. We can go over it next week.   Please call the internal medicine center clinic if you have any questions or concerns, we may be able to help and keep you from a long and expensive emergency room wait. Our clinic and after hours phone number is [phone], the best time to call is Monday through Friday 9 am to 4 pm but there is always someone available 24/7 if you have an emergency. If you need medication refills please notify your pharmacy one week in advance and they will send [place] a request.

## 2020-09-19 ENCOUNTER — Telehealth: Payer: Self-pay | Admitting: *Deleted

## 2020-09-19 NOTE — Telephone Encounter (Signed)
Community Message sent to [employer] to assist with BIPAP.Marland Kitchen  Order for DME BIPAP Received: Today Claudia Brady, CMA  New, Claudia Brady, Claudia Brady; Claudia Brady; 1 other Please assist pt with obtaining DME Bipap, order has been placed.   Thank you.   Claudia Brady "Claudia Brady" Claudia Brady)  [facility]  [phone] or [phone]

## 2020-09-19 NOTE — Assessment & Plan Note (Addendum)
BP Readings from Last 3 Encounters:  09/17/20 (!) 153/103  08/15/20 (!) 194/106  04/23/20 (!) 179/107   Blood pressure elevated. Current medications include losartan 100 mg although she only increased from 50 to 100 mg two days ago and metoprolol 25 mg bid.  She has tried HCTZ and CCB but did not like either as she felt they caused muscle pain.   - switch metoprolol to labetalol 100 mg bid  - continue losartan 100 mg  - f/u in 1-2 weeks for blood pressure check  - completed sleep study last year but has not received bipap - bipap ordered today

## 2020-09-19 NOTE — Assessment & Plan Note (Signed)
Weight has steadily been increasing over the past few years despite trying to lose weight. She states she has no motivation to get up and walk around although has been trying chair exercises that were provided at her last office visit. She understands that her health is declining due to her obesity. She is most inspired when her sister is visiting and encourages her to get up and walk around. She got a dog thinking it would help her go on walks but she worries about other dogs attacking him. She has met with our RD before and does not feel this will help.   - We discussed different strategies for weight loss and finding outside sources and people for inspiration such as the app Myfitnesspal, Weight watchers, and finding other online forums.  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  - PT ordered

## 2020-09-19 NOTE — Assessment & Plan Note (Signed)
Sleep study last year indicating severe OSA with need for bipap. She never received this.   - order placed for bipap today.

## 2020-09-19 NOTE — Assessment & Plan Note (Signed)
  She has chronic pain in the left knee associated with walking and especially going upstairs, which has worsened as she has gained weight. Voltaren gel does help somewhat. She is requesting a special order knee brace as she is unable to find one. PT has also helped her knee pain in the past.   - long discussion on importance of weight loss. See obesity - DME knee brace  - referral to PT

## 2020-09-24 NOTE — Progress Notes (Signed)
Internal Medicine Clinic Attending  Case discussed with Dr. Seawell  At the time of the visit.  We reviewed the resident's history and exam and pertinent patient test results.  I agree with the assessment, diagnosis, and plan of care documented in the resident's note.  

## 2020-09-25 ENCOUNTER — Ambulatory Visit (INDEPENDENT_AMBULATORY_CARE_PROVIDER_SITE_OTHER): Payer: Medicare Other | Admitting: Internal Medicine

## 2020-09-25 ENCOUNTER — Encounter: Payer: Self-pay | Admitting: Internal Medicine

## 2020-09-25 VITALS — BP 132/78 | HR 65 | Wt 382.5 lb

## 2020-09-25 DIAGNOSIS — I10 Essential (primary) hypertension: Secondary | ICD-10-CM

## 2020-09-25 DIAGNOSIS — G4733 Obstructive sleep apnea (adult) (pediatric): Secondary | ICD-10-CM | POA: Diagnosis not present

## 2020-09-25 NOTE — Telephone Encounter (Signed)
Current DME BIPAP order missing settings Will send to appropriate team to place new BIPAP order  Please refer to 11/22/2019 sleep study for pressure settings

## 2020-09-25 NOTE — Patient Instructions (Signed)
Thank you for allowing [place] to provide your care today. Today we discussed your blood pressure    I have ordered bmp for you. I will call if any are abnormal.    Today we made no changes to your medications.    Please follow-up as needed  To set up your physical therapy appointment, please call [phone]  Should you have any questions or concerns please call the internal medicine clinic at [phone].     [email or web address]">  DASH Eating Plan DASH stands for Dietary Approaches to Stop Hypertension. The DASH eating plan is a healthy eating plan that has been shown to:  Reduce high blood pressure (hypertension).  Reduce your risk for type 2 diabetes, heart disease, and stroke.  Help with weight loss. What are tips for following this plan? Reading food labels  Check food labels for the amount of salt (sodium) per serving. Choose foods with less than 5 percent of the Daily Value of sodium. Generally, foods with less than 300 milligrams (mg) of sodium per serving fit into this eating plan.  To find whole grains, look for the word "whole" as the first word in the ingredient list. Shopping  Buy products labeled as "low-sodium" or "no salt added."  Buy fresh foods. Avoid canned foods and pre-made or frozen meals. Cooking  Avoid adding salt when cooking. Use salt-free seasonings or herbs instead of table salt or sea salt. Check with your health care provider or pharmacist before using salt substitutes.  Do not fry foods. Cook foods using healthy methods such as baking, boiling, grilling, roasting, and broiling instead.  Cook with heart-healthy oils, such as olive, canola, avocado, soybean, or sunflower oil. Meal planning  Eat a balanced diet that includes: ? 4 or more servings of fruits and 4 or more servings of vegetables each day. Try to fill one-half of your plate with fruits and vegetables. ? 6-8 servings of whole grains each  day. ? Less than 6 oz (170 g) of lean meat, poultry, or fish each day. A 3-oz (85-g) serving of meat is about the same size as a deck of cards. One egg equals 1 oz (28 g). ? 2-3 servings of low-fat dairy each day. One serving is 1 cup (237 mL). ? 1 serving of nuts, seeds, or beans 5 times each week. ? 2-3 servings of heart-healthy fats. Healthy fats called omega-3 fatty acids are found in foods such as walnuts, flaxseeds, fortified milks, and eggs. These fats are also found in cold-water fish, such as sardines, salmon, and mackerel.  Limit how much you eat of: ? Canned or prepackaged foods. ? Food that is high in trans fat, such as some fried foods. ? Food that is high in saturated fat, such as fatty meat. ? Desserts and other sweets, sugary drinks, and other foods with added sugar. ? Full-fat dairy products.  Do not salt foods before eating.  Do not eat more than 4 egg yolks a week.  Try to eat at least 2 vegetarian meals a week.  Eat more home-cooked food and less restaurant, buffet, and fast food.   Lifestyle  When eating at a restaurant, ask that your food be prepared with less salt or no salt, if possible.  If you drink alcohol: ? Limit how much you use to:  0-1 drink a day for women who are not pregnant.  0-2 drinks a day for men. ? Be aware of how much alcohol is in your drink. In the [place] one drink  equals one 12 oz bottle of beer (355 mL), one 5 oz glass of wine (148 mL), or one 1 oz glass of hard liquor (44 mL). General information  Avoid eating more than 2,300 mg of salt a day. If you have hypertension, you may need to reduce your sodium intake to 1,500 mg a day.  Work with your health care provider to maintain a healthy body weight or to lose weight. Ask what an ideal weight is for you.  Get at least 30 minutes of exercise that causes your heart to beat faster (aerobic exercise) most days of the week. Activities may include walking, swimming, or biking.  Work with  your health care provider or dietitian to adjust your eating plan to your individual calorie needs. What foods should I eat? Fruits All fresh, dried, or frozen fruit. Canned fruit in natural juice (without added sugar). Vegetables Fresh or frozen vegetables (raw, steamed, roasted, or grilled). Low-sodium or reduced-sodium tomato and vegetable juice. Low-sodium or reduced-sodium tomato sauce and tomato paste. Low-sodium or reduced-sodium canned vegetables. Grains Whole-grain or whole-wheat bread. Whole-grain or whole-wheat pasta. Brown rice. Orpah Cobb. Bulgur. Whole-grain and low-sodium cereals. Pita bread. Low-fat, low-sodium crackers. Whole-wheat flour tortillas. Meats and other proteins Skinless chicken or [place]. Ground chicken or [place]. Pork with fat trimmed off. Fish and seafood. Egg whites. Dried beans, peas, or lentils. Unsalted nuts, nut butters, and seeds. Unsalted canned beans. Lean cuts of beef with fat trimmed off. Low-sodium, lean precooked or cured meat, such as sausages or meat loaves. Dairy Low-fat (1%) or fat-free (skim) milk. Reduced-fat, low-fat, or fat-free cheeses. Nonfat, low-sodium ricotta or cottage cheese. Low-fat or nonfat yogurt. Low-fat, low-sodium cheese. Fats and oils Soft margarine without trans fats. Vegetable oil. Reduced-fat, low-fat, or light mayonnaise and salad dressings (reduced-sodium). Canola, safflower, olive, avocado, soybean, and sunflower oils. Avocado. Seasonings and condiments Herbs. Spices. Seasoning mixes without salt. Other foods Unsalted popcorn and pretzels. Fat-free sweets. The items listed above may not be a complete list of foods and beverages you can eat. Contact a dietitian for more information. What foods should I avoid? Fruits Canned fruit in a light or heavy syrup. Fried fruit. Fruit in cream or butter sauce. Vegetables Creamed or fried vegetables. Vegetables in a cheese sauce. Regular canned vegetables (not low-sodium or  reduced-sodium). Regular canned tomato sauce and paste (not low-sodium or reduced-sodium). Regular tomato and vegetable juice (not low-sodium or reduced-sodium). Rosita Fire. Olives. Grains Baked goods made with fat, such as croissants, muffins, or some breads. Dry pasta or rice meal packs. Meats and other proteins Fatty cuts of meat. Ribs. Fried meat. Tomasa Blase. Bologna, salami, and other precooked or cured meats, such as sausages or meat loaves. Fat from the back of a pig (fatback). Bratwurst. Salted nuts and seeds. Canned beans with added salt. Canned or smoked fish. Whole eggs or egg yolks. Chicken or [place] with skin. Dairy Whole or 2% milk, cream, and half-and-half. Whole or full-fat cream cheese. Whole-fat or sweetened yogurt. Full-fat cheese. Nondairy creamers. Whipped toppings. Processed cheese and cheese spreads. Fats and oils Butter. Stick margarine. Lard. Shortening. Ghee. Bacon fat. Tropical oils, such as coconut, palm kernel, or palm oil. Seasonings and condiments Onion salt, garlic salt, seasoned salt, table salt, and sea salt. Worcestershire sauce. Tartar sauce. Barbecue sauce. Teriyaki sauce. Soy sauce, including reduced-sodium. Steak sauce. Canned and packaged gravies. Fish sauce. Oyster sauce. Cocktail sauce. Store-bought horseradish. Ketchup. Mustard. Meat flavorings and tenderizers. Bouillon cubes. Hot sauces. Pre-made or packaged marinades. Pre-made or packaged  taco seasonings. Relishes. Regular salad dressings. Other foods Salted popcorn and pretzels. The items listed above may not be a complete list of foods and beverages you should avoid. Contact a dietitian for more information. Where to find more information  National Heart, Lung, and Blood Institute: PopSteam.is  American Heart Association: www.heart.org  Academy of Nutrition and Dietetics: www.eatright.org  National Kidney Foundation: www.kidney.org Summary  The DASH eating plan is a healthy eating plan that has  been shown to reduce high blood pressure (hypertension). It may also reduce your risk for type 2 diabetes, heart disease, and stroke.  When on the DASH eating plan, aim to eat more fresh fruits and vegetables, whole grains, lean proteins, low-fat dairy, and heart-healthy fats.  With the DASH eating plan, you should limit salt (sodium) intake to 2,300 mg a day. If you have hypertension, you may need to reduce your sodium intake to 1,500 mg a day.  Work with your health care provider or dietitian to adjust your eating plan to your individual calorie needs. This information is not intended to replace advice given to you by your health care provider. Make sure you discuss any questions you have with your health care provider. Document Revised: 06/02/2019 Document Reviewed: 06/02/2019 Elsevier Patient Education  2021 [employer].

## 2020-09-25 NOTE — Telephone Encounter (Signed)
RE: Order for DME BIPAP Received: 5 days ago New, Bradley  New, Bradley; Maura Crandall, CMA; Stenson, Melissa Hello,   We may not need the usage notes, after re-looking into this order / account it looks like you are moving her to a BIPAP from CPAP and the unit was never given after new study. However we will still need the order updated.   Thank you,   Brad New        Previous Messages   ----- Message -----  From: Kathyrn Sheriff  Sent: 09/20/2020 12:38 PM EST  To: Penni Homans, *  Subject: RE: Order for DME BIPAP              Hello,   We are in need of a new order for CPAP if you are trying to obtain a new unit for this patient. We will also need usage an benefit notes showing the patient is compliant and benefiting from PAP therapy . The order in the [facility] account is not valid. For insurance to review we need the type of unit, settings, Dx code, signed and dated.   once these items are received we can proceed with processing this order request.   Thank you,   Luellen Pucker

## 2020-09-25 NOTE — Progress Notes (Signed)
   CC: High blood pressure  HPI: [name] is a 60 y.o. with PMH listed below presenting with complaint of high blood pressure. Please see problem based assessment and plan for further details.  Past Medical History:  Diagnosis Date  . ATRIAL FIBRILLATION 08/20/2010   Qualifier: History of  By: Excell Seltzer CMA, Lawson Fiscal    . Chronic pain   . Duodenal ulcer Jan. 2012   Upper GI bleeding 2nd to NSAID use  . GERD (gastroesophageal reflux disease)   . HIV (human immunodeficiency virus infection) (HCC) 2018  . Hypertension   . Iron deficiency anemia   . OSA (obstructive sleep apnea)   . Plantar fascia syndrome   . RESTLESS LEG SYNDROME 08/20/2010       . Rhinitis   . Varicose vein of leg     Review of Systems: Review of Systems  Constitutional: Negative for chills, fever and malaise/fatigue.  Eyes: Negative for blurred vision.  Respiratory: Negative for shortness of breath.   Cardiovascular: Negative for chest pain, palpitations and leg swelling.  Gastrointestinal: Negative for constipation, diarrhea, nausea and vomiting.  Genitourinary: Negative for dysuria, hematuria and urgency.  Neurological: Positive for dizziness. Negative for headaches.  Psychiatric/Behavioral: Negative for depression.  All other systems reviewed and are negative.    Physical Exam: Vitals:   09/25/20 1458  BP: 132/78  Pulse: 65  Weight: (!) 382 lb 8 oz (173.5 kg)   Gen: Well-developed, well nourished, NAD HEENT: NCAT head, hearing intact CV: RRR, S1, S2 normal Pulm: CTAB, No rales, no wheezes Extm: ROM intact, Peripheral pulses intact, No peripheral edema Skin: Dry, Warm, normal turgor  Assessment & Plan:   Obstructive sleep apnea Sleep study last year indicate severe OSA. BIPAP order in process but need pressure settings. BIpap order re-submitted with pressure setting.   Also discussed need for weight loss. [name] mentions she was told she will have PT referral placed. Advised [name]  that Rehab reached out to her but she did not pick up and provided number for her to call back.  HTN (hypertension)   [name] is a 60 yo F w/ PMH of morbid obesity, OSA, HTN presenting for f/u for bp management. Recent meds changed from losartan 50 to 100mg  and metoprolol changed to labetalol. She mentions having some dizziness when she stands up too fast but states it is manageable as long as she takes her time. Denies rash, cough, chest pain, palpitations, blurry vision. Mentions changing her diet to avoid salt and seeing improvement in her bp.  A/P Improved bp control. Above goal here but personal record shows acceptable bp. Should improve further with weight loss and continuing dietary changes. Encouraged [name] to f/u DASH diet.  - Advised on DASH - Bmp this visit - C/w labetalol 100mg  BID, losartan 100mg  daily    Patient discussed with Dr.  - , PGY3 [facility] Medicine Pager: [phone]

## 2020-09-25 NOTE — Assessment & Plan Note (Signed)
   Claudia Brady is a 60 yo F w/ PMH of morbid obesity, OSA, HTN presenting for f/u for bp management. Recent meds changed from losartan 50 to 100mg  and metoprolol changed to labetalol. She mentions having some dizziness when she stands up too fast but states it is manageable as long as she takes her time. Denies rash, cough, chest pain, palpitations, blurry vision. Mentions changing her diet to avoid salt and seeing improvement in her bp.  A/P Improved bp control. Above goal here but personal record shows acceptable bp. Should improve further with weight loss and continuing dietary changes. Encouraged Claudia Brady to f/u DASH diet.  - Advised on DASH - Bmp this visit - C/w labetalol 100mg  BID, losartan 100mg  daily

## 2020-09-25 NOTE — Telephone Encounter (Signed)
I'll send the new BIPAP order under today's in-person visit. Thank you.

## 2020-09-25 NOTE — Assessment & Plan Note (Signed)
Sleep study last year indicate severe OSA. BIPAP order in process but need pressure settings. BIpap order re-submitted with pressure setting.   Also discussed need for weight loss. Claudia Brady mentions she was told she will have PT referral placed. Advised Claudia Brady that Rehab reached out to her but she did not pick up and provided number for her to call back.

## 2020-09-26 ENCOUNTER — Encounter: Payer: Self-pay | Admitting: *Deleted

## 2020-09-26 NOTE — Progress Notes (Signed)
Internal Medicine Clinic Attending  Case discussed with Dr. Lee at the time of the visit.  We reviewed the resident's history and exam and pertinent patient test results.  I agree with the assessment, diagnosis, and plan of care documented in the resident's note.  Alexander Raines, M.D., Ph.D.  

## 2020-09-27 LAB — BMP8+ANION GAP
Anion Gap: 15 mmol/L (ref 10.0–18.0)
BUN/Creatinine Ratio: 13 (ref 9–23)
BUN: 11 mg/dL (ref 6–24)
CO2: 25 mmol/L (ref 20–29)
Calcium: 8.9 mg/dL (ref 8.7–10.2)
Chloride: 100 mmol/L (ref 96–106)
Creatinine, Ser: 0.84 mg/dL (ref 0.57–1.00)
Glucose: 84 mg/dL (ref 65–99)
Potassium: 5.3 mmol/L — ABNORMAL HIGH (ref 3.5–5.2)
Sodium: 140 mmol/L (ref 134–144)
eGFR: 80 mL/min/{1.73_m2} (ref >59)

## 2020-09-30 ENCOUNTER — Other Ambulatory Visit: Payer: Self-pay | Admitting: Internal Medicine

## 2020-09-30 DIAGNOSIS — I10 Essential (primary) hypertension: Secondary | ICD-10-CM

## 2020-10-01 ENCOUNTER — Telehealth: Payer: Self-pay | Admitting: Internal Medicine

## 2020-10-01 ENCOUNTER — Other Ambulatory Visit: Payer: Self-pay

## 2020-10-01 DIAGNOSIS — I10 Essential (primary) hypertension: Secondary | ICD-10-CM

## 2020-10-01 MED ORDER — LOSARTAN POTASSIUM 50 MG PO TABS: 75.0000 mg | ORAL_TABLET | Freq: Every day | ORAL | 5 refills | Status: DC

## 2020-10-01 NOTE — Telephone Encounter (Signed)
Pt is requesting her labetalol (NORMODYNE) 100 MG tablet sent to  [id number] - [place], [place] - [address] AT [facility] [address] [facility] [address] Phone:  [phone]  Fax:  [phone]      ( pt stated that the pharmacy told her she has no refills and she only have 6 pills left )

## 2020-10-01 NOTE — Telephone Encounter (Signed)
Discussed [name]'s lab result with evidence of hyperkalemia and need to reduce her losartan dose. Also advised to make f/u appointment to start different class of anti-hypertensive as increasing losartan dose is causing electrolyte imbalance. [name] expressed understanding.

## 2020-10-02 ENCOUNTER — Other Ambulatory Visit: Payer: Self-pay | Admitting: Internal Medicine

## 2020-10-02 DIAGNOSIS — I10 Essential (primary) hypertension: Secondary | ICD-10-CM

## 2020-10-02 MED ORDER — LABETALOL HCL 100 MG PO TABS: 100.0000 mg | ORAL_TABLET | Freq: Two times a day (BID) | ORAL | 0 refills | Status: DC

## 2020-10-06 ENCOUNTER — Other Ambulatory Visit: Payer: Self-pay | Admitting: Pharmacist

## 2020-10-06 DIAGNOSIS — B20 Human immunodeficiency virus [HIV] disease: Secondary | ICD-10-CM

## 2020-10-10 ENCOUNTER — Encounter: Payer: Self-pay | Admitting: *Deleted

## 2020-10-10 NOTE — Progress Notes (Signed)

## 2020-10-14 NOTE — Progress Notes (Signed)
Things That May Be Affecting Your Health:  Alcohol  Hearing loss  Pain   X Depression  Home Safety  Sexual Health   Diabetes X Lack of physical activity  Stress   Difficulty with daily activities  Loneliness  Tiredness   Drug use  Medicines  Tobacco use   Falls  Motor Vehicle Safety X Weight  X Food choices  Oral Health  Other    YOUR PERSONALIZED HEALTH PLAN : 1. Schedule your next subsequent Medicare Wellness visit in one year 2. Attend all of your regular appointments to address your medical issues 3. Complete the preventative screenings and services   Annual Wellness Visit   Medicare Covered Preventative Screenings and Services  Services & Screenings Men and Women Who How Often Need? Date of Last Service Action  Abdominal Aortic Aneurysm Adults with AAA risk factors Once      Alcohol Misuse and Counseling All Adults Screening once a year if no alcohol misuse. Counseling up to 4 face to face sessions.     Bone Density Measurement  Adults at risk for osteoporosis Once every 2 yrs      Lipid Panel Z13.6 All adults without CV disease Once every 5 yrs       Colorectal Cancer   Stool sample or  Colonoscopy All adults 50 and older   Once every year  Every 10 years        Depression All Adults Once a year  Today   Diabetes Screening Blood glucose, post glucose load, or GTT Z13.1  All adults at risk  Pre-diabetics  Once per year  Twice per year      Diabetes  Self-Management Training All adults Diabetics 10 hrs first year; 2 hours subsequent years. Requires Copay     Glaucoma  Diabetics  Family history of glaucoma  African Americans 50 yrs +  Hispanic Americans 65 yrs + Annually - requires coppay      Hepatitis C Z72.89 or F19.20  High Risk for HCV  Born between 1945 and 1965  Annually  Once      HIV Z11.4 All adults based on risk  Annually btw ages 38 & 13 regardless of risk  Annually > 65 yrs if at increased risk      Lung Cancer Screening  Asymptomatic adults aged 65-77 with 30 pack yr history and current smoker OR quit within the last 15 yrs Annually Must have counseling and shared decision making documentation before first screen      Medical Nutrition Therapy Adults with   Diabetes  Renal disease  Kidney transplant within past 3 yrs 3 hours first year; 2 hours subsequent years X    Obesity and Counseling All adults Screening once a year Counseling if BMI 30 or higher X Today   Tobacco Use Counseling Adults who use tobacco  Up to 8 visits in one year     Vaccines Z23  Hepatitis B  Influenza   Pneumonia  Adults   Once  Once every flu season  Two different vaccines separated by one year     Next Annual Wellness Visit People with Medicare Every year  Today     Services & Screenings Women Who How Often Need  Date of Last Service Action  Mammogram  Z12.31 Women over 40 One baseline ages 80-39. Annually ager 40 yrs+      Pap tests All women Annually if high risk. Every 2 yrs for normal risk women X  Screening for cervical cancer with   Pap (Z01.419 nl or Z01.411abnl) &  HPV Z11.51 Women aged 21 to 49 Once every 5 yrs     Screening pelvic and breast exams All women Annually if high risk. Every 2 yrs for normal risk women     Sexually Transmitted Diseases  Chlamydia  Gonorrhea  Syphilis All at risk adults Annually for non pregnant females at increased risk         Services & Screenings Men Who How Ofter Need  Date of Last Service Action  Prostate Cancer - DRE & PSA Men over 50 Annually.  DRE might require a copay.        Sexually Transmitted Diseases  Syphilis All at risk adults Annually for men at increased risk      Health Maintenance List Health Maintenance  Topic Date Due  . PAP SMEAR-Modifier  02/05/2019  . MAMMOGRAM  05/21/2019  . COVID-19 Vaccine (4 - Booster for Pfizer series) 11/27/2020  . INFLUENZA VACCINE  02/10/2021  . COLONOSCOPY (Pts 45-44yrs Insurance coverage will  need to be confirmed)  07/14/2023  . TETANUS/TDAP  08/01/2028  . Hepatitis C Screening  Completed  . HIV Screening  Completed  . HPV VACCINES  Aged Out

## 2020-10-17 ENCOUNTER — Other Ambulatory Visit: Payer: Self-pay

## 2020-10-17 ENCOUNTER — Ambulatory Visit: Payer: Medicare Other | Attending: Internal Medicine

## 2020-10-17 DIAGNOSIS — M25511 Pain in right shoulder: Secondary | ICD-10-CM | POA: Diagnosis not present

## 2020-10-17 DIAGNOSIS — M222X1 Patellofemoral disorders, right knee: Secondary | ICD-10-CM | POA: Insufficient documentation

## 2020-10-17 DIAGNOSIS — M25662 Stiffness of left knee, not elsewhere classified: Secondary | ICD-10-CM | POA: Diagnosis not present

## 2020-10-17 DIAGNOSIS — M6281 Muscle weakness (generalized): Secondary | ICD-10-CM | POA: Diagnosis not present

## 2020-10-17 DIAGNOSIS — R29810 Facial weakness: Secondary | ICD-10-CM | POA: Insufficient documentation

## 2020-10-17 DIAGNOSIS — M25661 Stiffness of right knee, not elsewhere classified: Secondary | ICD-10-CM | POA: Diagnosis not present

## 2020-10-17 DIAGNOSIS — M222X2 Patellofemoral disorders, left knee: Secondary | ICD-10-CM | POA: Diagnosis not present

## 2020-10-18 NOTE — Therapy (Signed)
[facility] [address] [place], [place], [address] Phone: [phone]   Fax:  [phone]  Physical Therapy Evaluation  Patient Details  Name: Claudia Brady MRN: [id number] Date of Birth: 1960/10/28 Referring Provider (PT): Anne Shutter, MD   Encounter Date: 10/17/2020   PT End of Session - 10/18/20 2245    Visit Number 1    Number of Visits 13    Date for PT Re-Evaluation 12/07/20    Authorization Type [employer] MEDICARE    Authorization Time Period MEDICAID [place] ACCESS    PT Start Time 1445    PT Stop Time 1530    PT Time Calculation (min) 45 min    Activity Tolerance Patient tolerated treatment well    Behavior During Therapy [facility] for tasks assessed/performed           Past Medical History:  Diagnosis Date  . ATRIAL FIBRILLATION 08/20/2010   Qualifier: History of  By: Excell Seltzer CMA, Lawson Fiscal    . Chronic pain   . Duodenal ulcer Jan. 2012   Upper GI bleeding 2nd to NSAID use  . GERD (gastroesophageal reflux disease)   . HIV (human immunodeficiency virus infection) (HCC) 2018  . Hypertension   . Iron deficiency anemia   . OSA (obstructive sleep apnea)   . Plantar fascia syndrome   . RESTLESS LEG SYNDROME 08/20/2010       . Rhinitis   . Varicose vein of leg     Past Surgical History:  Procedure Laterality Date  . FOOT SURGERY    . TONSILLECTOMY    . TUBAL LIGATION      There were no vitals filed for this visit.    Subjective Assessment - 10/18/20 2337    Subjective Pt reports she has had a physical decline related to extremely limited activity and weight gain during covid time frame of 2 years. Additionally, pt reports her endurance and balance has decreased as well. [place]otes it takes along time to get dressed, getting short of breath Needs to use shopping cart in the grocery store. Reports pain of her knees and feet, with the foot pain primarily at night. Knees pop when she walks.Pt rportsmultiple areas of  pain.    How long can you stand comfortably? 5 mins    How long can you walk comfortably? 5-10 mins    Patient Stated Goals Strength, endurance, less pain    Currently in Pain? Yes    Pain Score 5     Pain Location Knee    Pain Orientation Right;Left    Pain Descriptors / Indicators Sharp    Pain Type Chronic pain    Pain Radiating Towards toward feet    Pain Onset More than a month ago    Pain Frequency Constant    Pain Relieving Factors Medications    Pain Score 10    Pain Location Foot    Pain Orientation Right;Left    Pain Descriptors / Indicators Stabbing    Pain Onset More than a month ago    Pain Frequency Intermittent    Aggravating Factors  Occurs primarily at [facility] PT Assessment - 10/18/20 0001      Assessment   Medical Diagnosis Patellofemoral arthralgia of both knees; morbid obesity    Referring Provider (PT) Anne Shutter, MD    Hand Dominance Right    Prior Therapy No      Precautions  Precautions None      Restrictions   Weight Bearing Restrictions No      Balance Screen   Has the patient fallen in the past 6 months No      Home Environment   Living Environment Private residence    Living Arrangements Children    Type of Home Apartment    Home Access Stairs to enter    Entrance Stairs-Number of Steps 3    Entrance Stairs-Rails Right    Home Layout One level      Prior Function   Level of Independence Independent    Vocation On disability      Cognition   Overall Cognitive Status Within Functional Limits for tasks assessed      Observation/Other Assessments   Focus on Therapeutic Outcomes (FOTO)  NA      Sensation   Light Touch Appears Intact      ROM / Strength   AROM / PROM / Strength AROM;Strength      AROM   AROM Assessment Site Knee    Right/Left Knee Right;Left    Right Knee Extension 0    Right Knee Flexion 104    Left Knee Extension 0    Left Knee Flexion 95      Strength   Overall Strength  Comments L knee and hip are grossly 2+ to 3/5, pt is not able to complete a SLR. r knee and hip are grossly 3 to3+/5.      Palpation   Patella mobility WNLs      Transfers   Transfers Sit to Stand;Stand to Sit    Sit to Stand 6: Modified independent (Device/Increase time)   with use of hands     Ambulation/Gait   Ambulation/Gait Yes    Ambulation/Gait Assistance 6: Modified independent (Device/Increase time)    Gait Pattern --   Step through; Heel to toe pattern; out toeing   Gait velocity Decreased pace                      Objective measurements completed on examination: See above findings.               PT Education - 10/18/20 2244    Education Details Eval findings, POC, HEP, to walk in home every 1 to 2 hous as tolerated    Person(s) Educated Patient    Methods Explanation;Demonstration;Tactile cues;Verbal cues;Handout    Comprehension Verbalized understanding;Returned demonstration;Verbal cues required;Tactile cues required;Need further instruction            PT Short Term Goals - 10/18/20 2328      PT SHORT TERM GOAL #1   Title Independent with initial HEP    Status New    Target Date 11/08/20      PT SHORT TERM GOAL #2   Title Pt will voice understanding of measures to asssit in the management of knee pain    Status New    Target Date 11/08/20             PT Long Term Goals - 10/18/20 2330      PT LONG TERM GOAL #1   Title Pt will be Ind in a final HEP to maintain achieved LOF    Status New    Target Date 12/07/20      PT LONG TERM GOAL #2   Title Improve L knee and hip strength to 3+to 4-/5 and the R LE to 4- to 4/5 strength for improved  functional mobility    Status New    Target Date 12/07/20      PT LONG TERM GOAL #3   Title Pt will report ed improve esae of mobility being able to walk 15 mins with need to rest    Status New    Target Date 12/07/20      PT LONG TERM GOAL #4   Title Pt will report a decrease in knee  pain to 3/10 consistently and occuring  intermittently with daily activities    Baseline 5/10    Status New    Target Date 12/07/20                  Plan - 10/18/20 2301    Clinical Impression Statement Pt presents with bilat knee and other areas of pain. During covid pt decreased her activity, staying in her apt. and primarily in her room. DO to pt's morbid obesity, pt knees are difficult to assess, although patella mobility is good.Both LEs are weak, L > than R, and pt's tolerance to activity is very low.Pt indicates she hopes PT wil get her moving well enough to start going to a gym. Pt will benefit from PT 2w6 to improve knee/LE strength and pt's activity tolerance to optimize pt's function.    Personal Factors and Comorbidities Time since onset of injury/illness/exacerbation;Past/Current Experience;Comorbidity 3+    Comorbidities morbid obesity, fallen arch, depression, HIV    Examination-Activity Limitations Locomotion Level;Squat;Stairs;Stand    Examination-Participation Restrictions Shop;Cleaning    Stability/Clinical Decision Making Evolving/Moderate complexity    Clinical Decision Making Moderate    Rehab Potential Fair    PT Frequency 2x / week    PT Duration 6 weeks    PT Treatment/Interventions ADLs/Self Care Home Management;Electrical Stimulation;Iontophoresis 4mg /ml Dexamethasone;Moist Heat;Balance training;Therapeutic exercise;Therapeutic activities;Functional mobility training;Stair training;Gait training;Patient/family education;Manual techniques;Passive range of motion;Taping    PT Next Visit Plan Assess reponse to HEP, assess 5xST and and set goals           Patient will benefit from skilled therapeutic intervention in order to improve the following deficits and impairments:  Pain,Obesity,Difficulty walking,Decreased endurance,Decreased activity tolerance,Decreased strength,Decreased balance  Visit Diagnosis: Patellofemoral arthralgia of both  knees  Morbid obesity (HCC)  Decreased range of motion (ROM) of left knee  Decreased range of motion (ROM) of right knee  Facial weakness  Acute pain of right shoulder     Problem List Patient Active Problem List   Diagnosis Date Noted  . Patellofemoral arthralgia of both knees 03/25/2020  . Shoulder injury 01/31/2020  . Hemorrhoid 04/05/2019  . Healthcare maintenance 04/05/2019  . Migraine 09/17/2018  . Venous insufficiency 09/16/2018  . Abnormal uterine bleeding (AUB) 02/04/2018  . Morbid obesity (HCC) 04/19/2017  . 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 HTN (hypertension) 10/23/2016  . Human immunodeficiency virus I infection (HCC) 10/02/2016  . Poor dentition 10/02/2016  . Depression 11/28/2010  . Obstructive sleep apnea 08/20/2010    10/19/2010 MS, PT 10/18/20 11:41 PM  [facility] Outpatient Rehabilitation [facility] [address] [place], [place], [place] Phone: [phone]   Fax:  [phone]  Name: Claudia Brady MRN: Galvin [id number] Date of Birth: 08/30/1960

## 2020-11-04 ENCOUNTER — Ambulatory Visit (INDEPENDENT_AMBULATORY_CARE_PROVIDER_SITE_OTHER): Payer: Medicare Other | Admitting: Student

## 2020-11-04 ENCOUNTER — Encounter: Payer: Self-pay | Admitting: Student

## 2020-11-04 ENCOUNTER — Telehealth: Payer: Self-pay

## 2020-11-04 ENCOUNTER — Other Ambulatory Visit: Payer: Self-pay

## 2020-11-04 DIAGNOSIS — R635 Abnormal weight gain: Secondary | ICD-10-CM | POA: Diagnosis not present

## 2020-11-04 DIAGNOSIS — Z0000 Encounter for general adult medical examination without abnormal findings: Secondary | ICD-10-CM | POA: Diagnosis not present

## 2020-11-04 NOTE — Telephone Encounter (Signed)
Hi Kaye,  I did an AWV on this patient today and she was asking about her cpap, states she hasn't heard anything on it since her visit with Dr. Cleaster Corin.  I saw in Dr. Al Decant LOV notes she mentioned it and bipap?  Can you help with this? Thanks, [employer]

## 2020-11-04 NOTE — Patient Instructions (Addendum)
All Notes   Progress Notes by Dellia Cloud, MD at 10/10/2020 11:06 AM  Author: Dellia Cloud, MD Author Type: Resident Filed: 10/14/2020 8:46 AM  Note Status: Signed Cosign: Cosign Not Required Encounter Date: 10/10/2020  Editor: Dellia Cloud, MD (Resident)             Things That May Be Affecting Your Health:  Alcohol  Hearing loss  Pain   X Depression  Home Safety  Sexual Health   Diabetes X Lack of physical activity  Stress   Difficulty with daily activities  Loneliness  Tiredness   Drug use  Medicines  Tobacco use   Falls  Motor Vehicle Safety X Weight  X Food choices  Oral Health  Other    YOUR PERSONALIZED HEALTH PLAN : 1. Schedule your next subsequent Medicare Wellness visit in one year 2. Attend all of your regular appointments to address your medical issues 3. Complete the preventative screenings and services 5.  An appointment has been made for you to discuss b/p medications per Dr. Marigene Ehlers recommendation.  This appointment is on Friday, 11/15/20 @ 1045. 6.  Begin seated and standing exercises with exercise band to increase strength and balance 7.  As discussed, please start setting an alarm on your smartphone which will alarm when your medications are due. 8.  A referral for weight management services will be made today.    Annual Wellness Visit                       Medicare Covered Preventative Screenings and Services  Services & Screenings Men and Women Who How Often Need? Date of Last Service Action  Abdominal Aortic Aneurysm Adults with AAA risk factors Once      Alcohol Misuse and Counseling All Adults Screening once a year if no alcohol misuse. Counseling up to 4 face to face sessions.     Bone Density Measurement  Adults at risk for osteoporosis Once every 2 yrs      Lipid Panel Z13.6 All adults without CV disease Once every 5 yrs       Colorectal Cancer   Stool sample or  Colonoscopy All adults 50 and older   Once  every year  Every 10 years        Depression All Adults Once a year  Today   Diabetes Screening Blood glucose, post glucose load, or GTT Z13.1  All adults at risk  Pre-diabetics  Once per year  Twice per year      Diabetes  Self-Management Training All adults Diabetics 10 hrs first year; 2 hours subsequent years. Requires Copay     Glaucoma  Diabetics  Family history of glaucoma  African Americans 50 yrs +  Hispanic Americans 65 yrs + Annually - requires coppay      Hepatitis C Z72.89 or F19.20  High Risk for HCV  Born between 1945 and 1965  Annually  Once      HIV Z11.4 All adults based on risk  Annually btw ages 59 & 80 regardless of risk  Annually > 65 yrs if at increased risk      Lung Cancer Screening Asymptomatic adults aged 29-77 with 30 pack yr history and current smoker OR quit within the last 15 yrs Annually Must have counseling and shared decision making documentation before first screen      Medical Nutrition Therapy Adults with   Diabetes  Renal disease  Kidney transplant within past 3 yrs 3 hours  first year; 2 hours subsequent years X    Obesity and Counseling All adults Screening once a year Counseling if BMI 30 or higher X Today   Tobacco Use Counseling Adults who use tobacco  Up to 8 visits in one year     Vaccines Z23  Hepatitis B  Influenza   Pneumonia  Adults   Once  Once every flu season  Two different vaccines separated by one year     Next Annual Wellness Visit People with Medicare Every year  Today     Services & Screenings Women Who How Often Need  Date of Last Service Action  Mammogram  Z12.31 Women over 40 One baseline ages 935-39. Annually ager 40 yrs+      Pap tests All women Annually if high risk. Every 2 yrs for normal risk women X     Screening for cervical cancer with   Pap (Z01.419 nl or Z01.411abnl) &  HPV Z11.51 Women aged 60 to 3265 Once every 5 yrs      Screening pelvic and breast exams All women Annually if high risk. Every 2 yrs for normal risk women     Sexually Transmitted Diseases  Chlamydia  Gonorrhea  Syphilis All at risk adults Annually for non pregnant females at increased risk         Services & Screenings Men Who How Ofter Need  Date of Last Service Action  Prostate Cancer - DRE & PSA Men over 50 Annually.  DRE might require a copay.        Sexually Transmitted Diseases  Syphilis All at risk adults Annually for men at increased risk             Health Maintenance, Female Adopting a healthy lifestyle and getting preventive care are important in promoting health and wellness. Ask your health care provider about:  The right schedule for you to have regular tests and exams.  Things you can do on your own to prevent diseases and keep yourself healthy. What should I know about diet, weight, and exercise? Eat a healthy diet  Eat a diet that includes plenty of vegetables, fruits, low-fat dairy products, and lean protein.  Do not eat a lot of foods that are high in solid fats, added sugars, or sodium.   Maintain a healthy weight Body mass index (BMI) is used to identify weight problems. It estimates body fat based on height and weight. Your health care provider can help determine your BMI and help you achieve or maintain a healthy weight. Get regular exercise Get regular exercise. This is one of the most important things you can do for your health. Most adults should:  Exercise for at least 150 minutes each week. The exercise should increase your heart rate and make you sweat (moderate-intensity exercise).  Do strengthening exercises at least twice a week. This is in addition to the moderate-intensity exercise.  Spend less time sitting. Even light physical activity can be beneficial. Watch cholesterol and blood lipids Have your blood tested for lipids and cholesterol at 60 years of age,  then have this test every 5 years. Have your cholesterol levels checked more often if:  Your lipid or cholesterol levels are high.  You are older than 60 years of age.  You are at high risk for heart disease. What should I know about cancer screening? Depending on your health history and family history, you may need to have cancer screening at various ages. This may include screening for:  Breast cancer.  Cervical cancer.  Colorectal cancer.  Skin cancer.  Lung cancer. What should I know about heart disease, diabetes, and high blood pressure? Blood pressure and heart disease  High blood pressure causes heart disease and increases the risk of stroke. This is more likely to develop in people who have high blood pressure readings, are of African descent, or are overweight.  Have your blood pressure checked: ? Every 3-5 years if you are 13-77 years of age. ? Every year if you are 71 years old or older. Diabetes Have regular diabetes screenings. This checks your fasting blood sugar level. Have the screening done:  Once every three years after age 61 if you are at a normal weight and have a low risk for diabetes.  More often and at a younger age if you are overweight or have a high risk for diabetes. What should I know about preventing infection? Hepatitis B If you have a higher risk for hepatitis B, you should be screened for this virus. Talk with your health care provider to find out if you are at risk for hepatitis B infection. Hepatitis C Testing is recommended for:  Everyone born from 29 through 1965.  Anyone with known risk factors for hepatitis C. Sexually transmitted infections (STIs)  Get screened for STIs, including gonorrhea and chlamydia, if: ? You are sexually active and are younger than 60 years of age. ? You are older than 60 years of age and your health care provider tells you that you are at risk for this type of infection. ? Your sexual activity has  changed since you were last screened, and you are at increased risk for chlamydia or gonorrhea. Ask your health care provider if you are at risk.  Ask your health care provider about whether you are at high risk for HIV. Your health care provider may recommend a prescription medicine to help prevent HIV infection. If you choose to take medicine to prevent HIV, you should first get tested for HIV. You should then be tested every 3 months for as long as you are taking the medicine. Pregnancy  If you are about to stop having your period (premenopausal) and you may become pregnant, seek counseling before you get pregnant.  Take 400 to 800 micrograms (mcg) of folic acid every day if you become pregnant.  Ask for birth control (contraception) if you want to prevent pregnancy. Osteoporosis and menopause Osteoporosis is a disease in which the bones lose minerals and strength with aging. This can result in bone fractures. If you are 65 years old or older, or if you are at risk for osteoporosis and fractures, ask your health care provider if you should:  Be screened for bone loss.  Take a calcium or vitamin D supplement to lower your risk of fractures.  Be given hormone replacement therapy (HRT) to treat symptoms of menopause. Follow these instructions at home: Lifestyle  Do not use any products that contain nicotine or tobacco, such as cigarettes, e-cigarettes, and chewing tobacco. If you need help quitting, ask your health care provider.  Do not use street drugs.  Do not share needles.  Ask your health care provider for help if you need support or information about quitting drugs. Alcohol use  Do not drink alcohol if: ? Your health care provider tells you not to drink. ? You are pregnant, may be pregnant, or are planning to become pregnant.  If you drink alcohol: ? Limit how much you use to 0-1  drink a day. ? Limit intake if you are breastfeeding.  Be aware of how much alcohol is in  your drink. In the [place] one drink equals one 12 oz bottle of beer (355 mL), one 5 oz glass of wine (148 mL), or one 1 oz glass of hard liquor (44 mL). General instructions  Schedule regular health, dental, and eye exams.  Stay current with your vaccines.  Tell your health care provider if: ? You often feel depressed. ? You have ever been abused or do not feel safe at home. Summary  Adopting a healthy lifestyle and getting preventive care are important in promoting health and wellness.  Follow your health care provider's instructions about healthy diet, exercising, and getting tested or screened for diseases.  Follow your health care provider's instructions on monitoring your cholesterol and blood pressure. This information is not intended to replace advice given to you by your health care provider. Make sure you discuss any questions you have with your health care provider. Document Revised: 06/22/2018 Document Reviewed: 06/22/2018 Elsevier Patient Education  2021 [employer].

## 2020-11-04 NOTE — Progress Notes (Signed)
Internal Medicine Clinic Attending  Case discussed with Dr. Amponsah at the time of the visit.  We reviewed the AWV findings.  I agree with the assessment, diagnosis, and plan of care documented in the AWV note.      

## 2020-11-04 NOTE — Progress Notes (Signed)
This AWV is being conducted by TELEHEALTH - AUDIO only. The patient was located at home and I was located in [employer]. The patient's identity was confirmed using their DOB and current address. The patient or his/her legal guardian has consented to being evaluated through a telephone encounter and understands the associated risks (an examination cannot be done and the patient may need to come in for an appointment) / benefits (allows the patient to remain at home, decreasing exposure to coronavirus). I personally spent 40 minutes conducting the AWV.  Subjective:   Claudia Brady is a 60 y.o. female who presents for a Medicare Annual Wellness Visit.  The following items have been reviewed and updated today in the appropriate area in the EMR.   Health Risk Assessment  Height, weight, BMI, and BP Visual acuity if needed Depression screen Fall risk / safety level Advance directive discussion Medical and family history were reviewed and updated Updating list of other providers & suppliers Medication reconciliation, including over the counter medicines Cognitive screen Written screening schedule Risk Factor list Personalized health advice, risky behaviors, and treatment advice  Social History   Social History Narrative   Current Social History 11/04/2020        Patient lives with her son in an apartment which is 1 story. There are steps up to the entrance the patient uses.       Patient's method of transportation is city bus, taxi cab.      The highest level of education was some college.      The patient currently does not work.      Identified important Relationships are "God"       Pets : a dog, maltese       Interests / Fun: Gardening, plants       Current Stressors: "none"             Objective:    Vitals: There were no vitals taken for this visit. Vitals are unable to obtained due to COVID-19 public health emergency  Activities of Daily Living In your present state  of health, do you have any difficulty performing the following activities: 11/04/2020 09/25/2020  Hearing? N N  Vision? N N  Comment - -  Difficulty concentrating or making decisions? Y N  Comment sometimes -  Walking or climbing stairs? Y Y  Comment - -  Dressing or bathing? Y Y  Comment working on dressing herself (socks and shoes) -  Doing errands, shopping? N Y  Comment - -  Some recent data might be hidden    Goals Goals    .  Increase physical activity      Begin seated and standing exercises with exercise band 2-3 times a week for 5-10 minutes to increase strength and balance.     .  Taking medications at prescribed times (pt-stated)      Patient will set an alarm on her phone which will go off when her medications are due       Fall Risk Fall Risk  11/04/2020 09/25/2020 09/17/2020 03/25/2020 02/08/2020  Falls in the past year? 1 1 1 1 1   Number falls in past yr: 0 0 1 0 1  Injury with Fall? 0 0 0 1 1  Risk for fall due to : Other (Comment) History of fall(s) History of fall(s) History of fall(s);Impaired balance/gait History of fall(s);Impaired balance/gait  Risk for fall due to: Comment out of bed - - - -  Follow up  Falls evaluation completed Falls evaluation completed Falls evaluation completed Falls evaluation completed Falls prevention discussed    Depression Screen PHQ 2/9 Scores 11/04/2020 09/17/2020 03/25/2020 01/30/2020  PHQ - 2 Score 0 1 1 1   PHQ- 9 Score 3 3 6 8      Cognitive Testing Six-Item Cognitive Screener   "I would like to ask you some questions that ask you to use your memory. I am going to name three objects. Please wait until I say all three words, then repeat them. Remember what they are  because I am going to ask you to name them again in a few minutes. Please repeat these words for me: APPLE--TABLE--PENNY." (Interviewer may repeat names 3 times if necessary but repetition not scored.)  Did patient correctly repeat all three words? Yes - may  proceed with screen  What year is this? Correct What month is this? Correct What day of the week is this? Correct  What were the three objects I asked you to remember? . Apple Correct . Table Correct . Penny Correct  Score one point for each incorrect answer.  A score of 2 or more points warrants additional investigation.  Patient's score 0     Assessment and Plan:     The patient would like a referral to [facility] for morbid obesity.  As of note, a referral was made for this on 03/25/20 which the patient declined and the referral was closed out.  The patient is now interested in weight management and is requesting a referral.  The patient expressed she has trouble taking medications at the time they are prescribed.  The patient has made a goal to add alarms to her smartphone at the times her medications are due.  The patient has made another goal to increase her activity level.  She will begin seated and standing exercises with exercise band to increase strength and balance. CDC Handout on Fall Prevention and Handout on Home Exercise Program, Access codes 03/27/20 and 08-08-1970 given/mailed to patient with exercise band.   A follow up appointment was made for 11/15/20 @ 1045 with Dr. [id number] to discuss b/p medications per Dr. 01/15/21 telephone note on 10/01/20.  During the course of the visit the patient was educated and counseled about appropriate screening and preventive services as documented in the assessment and plan.  The printed AVS was given to the patient and included an updated screening schedule, a list of risk factors, and personalized health advice.        Marigene Ehlers, RN  11/04/2020

## 2020-11-07 ENCOUNTER — Ambulatory Visit: Payer: Medicare Other

## 2020-11-08 ENCOUNTER — Other Ambulatory Visit: Payer: Self-pay

## 2020-11-08 ENCOUNTER — Ambulatory Visit: Payer: Medicare Other

## 2020-11-08 DIAGNOSIS — M6281 Muscle weakness (generalized): Secondary | ICD-10-CM | POA: Diagnosis not present

## 2020-11-08 DIAGNOSIS — M25661 Stiffness of right knee, not elsewhere classified: Secondary | ICD-10-CM | POA: Diagnosis not present

## 2020-11-08 DIAGNOSIS — M222X1 Patellofemoral disorders, right knee: Secondary | ICD-10-CM

## 2020-11-08 DIAGNOSIS — M222X2 Patellofemoral disorders, left knee: Secondary | ICD-10-CM | POA: Diagnosis not present

## 2020-11-08 DIAGNOSIS — M25662 Stiffness of left knee, not elsewhere classified: Secondary | ICD-10-CM

## 2020-11-08 DIAGNOSIS — M25511 Pain in right shoulder: Secondary | ICD-10-CM | POA: Diagnosis not present

## 2020-11-08 DIAGNOSIS — R29810 Facial weakness: Secondary | ICD-10-CM | POA: Diagnosis not present

## 2020-11-08 NOTE — Therapy (Signed)
[facility] [address] [place], [place], [address] Phone: [phone]   Fax:  [phone]  Physical Therapy Treatment  Patient Details  Name: Claudia Brady MRN: [id number] Date of Birth: 10/15/1960 Referring Provider (PT): Anne Shutter, MD   Encounter Date: 11/08/2020   PT End of Session - 11/08/20 1322    Visit Number 2    Number of Visits 13    Date for PT Re-Evaluation 12/07/20    Authorization Type UHC MEDICARE    Authorization Time Period MEDICAID [place] ACCESS    PT Start Time 1221    PT Stop Time 1305    PT Time Calculation (min) 44 min    Activity Tolerance Patient tolerated treatment well    Behavior During Therapy [facility] for tasks assessed/performed           Past Medical History:  Diagnosis Date  . ATRIAL FIBRILLATION 08/20/2010   Qualifier: History of  By: Excell Seltzer CMA, Lawson Fiscal    . Chronic pain   . Duodenal ulcer Jan. 2012   Upper GI bleeding 2nd to NSAID use  . GERD (gastroesophageal reflux disease)   . HIV (human immunodeficiency virus infection) (HCC) 2018  . Hypertension   . Iron deficiency anemia   . OSA (obstructive sleep apnea)   . Plantar fascia syndrome   . RESTLESS LEG SYNDROME 08/20/2010       . Rhinitis   . Varicose vein of leg     Past Surgical History:  Procedure Laterality Date  . FOOT SURGERY    . TONSILLECTOMY    . TUBAL LIGATION      There were no vitals filed for this visit.   Subjective Assessment - 11/08/20 1225    Subjective Pt reports seh has been more active, completing her HEP 2x daily and walking more.    Currently in Pain? Yes    Pain Score 3     Pain Location Knee    Pain Orientation Right;Left    Pain Descriptors / Indicators Nagging    Pain Type Chronic pain    Pain Onset More than a month ago    Pain Frequency Intermittent    Pain Score 3    Pain Location Foot   ankle   Pain Orientation Left;Right    Pain Descriptors / Indicators Stabbing    Pain  Type Chronic pain    Pain Onset More than a month ago    Pain Frequency Intermittent              [facility] PT Assessment - 11/08/20 0001      Transfers   Five time sit to stand comments  16.3 sec      Ambulation/Gait   Gait Comments 2MWT=247ft c 1 rest break                         [facility] Adult PT Treatment/Exercise - 11/08/20 0001      Exercises   Exercises Knee/Hip      Knee/Hip Exercises: Seated   Sit to Sand 2 sets;5 reps;without UE support      Knee/Hip Exercises: Supine   Bridges Both;5 reps    Straight Leg Raises Right;Left;2 sets;5 reps    Other Supine Knee/Hip Exercises Marching; 10x; 2 sets      Knee/Hip Exercises: Sidelying   Hip ABduction Right;Left;2 sets;5 reps    Other Sidelying Knee/Hip Exercises Hip ext; L and R; 5x; 2 sets  PT Education - 11/08/20 1322    Education Details Updated HEP    Person(s) Educated Patient    Methods Explanation;Demonstration;Tactile cues;Verbal cues;Handout    Comprehension Verbalized understanding;Returned demonstration;Verbal cues required;Tactile cues required;Need further instruction            PT Short Term Goals - 10/18/20 2328      PT SHORT TERM GOAL #1   Title Independent with initial HEP    Status New    Target Date 11/08/20      PT SHORT TERM GOAL #2   Title Pt will voice understanding of measures to asssit in the management of knee pain    Status New    Target Date 11/08/20             PT Long Term Goals - 11/08/20 1332      PT LONG TERM GOAL #5   Title Pts 5xSTS s use of hands time will decrease to 11 sec or less as indication of improved function    Status New    Target Date 12/07/20      Additional Long Term Goals   Additional Long Term Goals Yes      PT LONG TERM GOAL #6   Title Pt's distance will increase to 24ft s need for a rest break as indication of improved functional tolerance    Baseline 268ft c 1 rest break    Status New                  Plan - 11/08/20 1323    Clinical Impression Statement Completed 5xSTS and . Pt demonstrated improved endurance today tolerating the walking from the waiting room to the treatment room with better tolerance than on the eval. PT was continued for LE/knee and core strengthening with addtional exs completd today and added to the pt's HEP. Pt participated with good effort and tolerated the session well. With the , the pt did need to sit and rest brief due to DOE.    Personal Factors and Comorbidities Time since onset of injury/illness/exacerbation;Past/Current Experience;Comorbidity 3+    Comorbidities morbid obesity, fallen arch, depression, HIV    Examination-Activity Limitations Locomotion Level;Squat;Stairs;Stand    Examination-Participation Restrictions Shop;Cleaning    Stability/Clinical Decision Making Evolving/Moderate complexity    Clinical Decision Making Moderate    Rehab Potential Fair    PT Frequency 2x / week    PT Duration 6 weeks    PT Treatment/Interventions ADLs/Self Care Home Management;Electrical Stimulation;Iontophoresis 4mg /ml Dexamethasone;Moist Heat;Balance training;Therapeutic exercise;Therapeutic activities;Functional mobility training;Stair training;Gait training;Patient/family education;Manual techniques;Passive range of motion;Taping    PT Next Visit Plan Assess reponse to HEP    PT Home Exercise Plan    Consulted and Agree with Plan of Care Patient           Patient will benefit from skilled therapeutic intervention in order to improve the following deficits and impairments:  Pain,Obesity,Difficulty walking,Decreased endurance,Decreased activity tolerance,Decreased strength,Decreased balance  Visit Diagnosis: Patellofemoral arthralgia of both knees  Morbid obesity (HCC)  Decreased range of motion (ROM) of left knee  Decreased range of motion (ROM) of right knee  Muscle weakness (generalized)     Problem List Patient  Active Problem List   Diagnosis Date Noted  . Patellofemoral arthralgia of both knees 03/25/2020  . Shoulder injury 01/31/2020  . Hemorrhoid 04/05/2019  . Healthcare maintenance 04/05/2019  . Migraine 09/17/2018  . Venous insufficiency 09/16/2018  . Abnormal uterine bleeding (AUB) 02/04/2018  . Morbid obesity (HCC) 04/19/2017  .  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 HTN (hypertension) 10/23/2016  . Human immunodeficiency virus I infection (HCC) 10/02/2016  . Poor dentition 10/02/2016  . Depression 11/28/2010  . Obstructive sleep apnea 08/20/2010   Joellyn Rued MS, PT 11/08/20 1:43 PM  [facility] [address] [place], [place], [address] Phone: [phone]   Fax:  [phone]  Name: Claudia Brady MRN: [id number] Date of Birth: [birth year]

## 2020-11-12 ENCOUNTER — Ambulatory Visit: Payer: Medicare Other | Attending: Internal Medicine

## 2020-11-12 ENCOUNTER — Other Ambulatory Visit: Payer: Self-pay

## 2020-11-12 DIAGNOSIS — M25511 Pain in right shoulder: Secondary | ICD-10-CM | POA: Diagnosis not present

## 2020-11-12 DIAGNOSIS — M222X1 Patellofemoral disorders, right knee: Secondary | ICD-10-CM | POA: Diagnosis not present

## 2020-11-12 DIAGNOSIS — M25661 Stiffness of right knee, not elsewhere classified: Secondary | ICD-10-CM | POA: Diagnosis not present

## 2020-11-12 DIAGNOSIS — M25611 Stiffness of right shoulder, not elsewhere classified: Secondary | ICD-10-CM | POA: Insufficient documentation

## 2020-11-12 DIAGNOSIS — R29810 Facial weakness: Secondary | ICD-10-CM | POA: Diagnosis not present

## 2020-11-12 DIAGNOSIS — M222X2 Patellofemoral disorders, left knee: Secondary | ICD-10-CM | POA: Diagnosis not present

## 2020-11-12 DIAGNOSIS — M6281 Muscle weakness (generalized): Secondary | ICD-10-CM | POA: Diagnosis not present

## 2020-11-12 DIAGNOSIS — M25662 Stiffness of left knee, not elsewhere classified: Secondary | ICD-10-CM | POA: Diagnosis not present

## 2020-11-12 NOTE — Therapy (Signed)
[facility] [address] [place], [place], [address] Phone: [phone]   Fax:  [phone]  Physical Therapy Treatment  Patient Details  Name: Claudia Brady MRN: [id number] Date of Birth: Nov 21, 1960 Referring Provider (PT): Anne Shutter, MD   Encounter Date: 11/12/2020   PT End of Session - 11/12/20 1902    Visit Number 3    Number of Visits 13    Date for PT Re-Evaluation 12/07/20    Authorization Type UHC MEDICARE    Authorization Time Period MEDICAID [place] ACCESS    PT Start Time 1751    PT Stop Time 1832    PT Time Calculation (min) 41 min    Activity Tolerance Patient tolerated treatment well    Behavior During Therapy [facility] for tasks assessed/performed           Past Medical History:  Diagnosis Date  . ATRIAL FIBRILLATION 08/20/2010   Qualifier: History of  By: Excell Seltzer CMA, Lawson Fiscal    . Chronic pain   . Duodenal ulcer Jan. 2012   Upper GI bleeding 2nd to NSAID use  . GERD (gastroesophageal reflux disease)   . HIV (human immunodeficiency virus infection) (HCC) 2018  . Hypertension   . Iron deficiency anemia   . OSA (obstructive sleep apnea)   . Plantar fascia syndrome   . RESTLESS LEG SYNDROME 08/20/2010       . Rhinitis   . Varicose vein of leg     Past Surgical History:  Procedure Laterality Date  . FOOT SURGERY    . TONSILLECTOMY    . TUBAL LIGATION      There were no vitals filed for this visit.   Subjective Assessment - 11/12/20 1755    Subjective Today is not as good, my R foot are bothering me alot today. I've had problems with my feet for years.    Patient Stated Goals Strength, endurance, less pain    Pain Score 7     Pain Location Knee    Pain Orientation Left    Pain Descriptors / Indicators Nagging    Pain Onset More than a month ago    Pain Frequency Intermittent    Pain Score 7    Pain Location Foot    Pain Orientation Left    Pain Descriptors / Indicators Stabbing    Pain  Type Chronic pain    Pain Onset More than a month ago    Pain Frequency Intermittent    Aggravating Factors  Occurs primarily at night                             [facility] Adult PT Treatment/Exercise - 11/12/20 0001      Exercises   Exercises Knee/Hip;Ankle      Knee/Hip Exercises: Supine   Straight Leg Raises Right;Left;2 sets;5 reps    Other Supine Knee/Hip Exercises Marching; 10x; 2 sets      Knee/Hip Exercises: Sidelying   Hip ABduction Right;Left;2 sets;5 reps    Clams Right;Left;2 sets;5 reps;    Other Sidelying Knee/Hip Exercises Hip ext; L and R; 5x; 2 sets      Ankle Exercises: Seated   Towel Crunch --   20x   Heel Raises 20 reps    Toe Raise 20 reps    Other Seated Ankle Exercises Heel lifts; 15x                  PT  Education - 11/12/20 1901    Education Details Updated HEP with exs for feet. Tennis ball for foot massage.    Person(s) Educated Patient    Methods Explanation;Demonstration;Tactile cues;Verbal cues;Handout    Comprehension Verbalized understanding;Returned demonstration;Verbal cues required;Tactile cues required;Need further instruction            PT Short Term Goals - 10/18/20 2328      PT SHORT TERM GOAL #1   Title Independent with initial HEP    Status New    Target Date 11/08/20      PT SHORT TERM GOAL #2   Title Pt will voice understanding of measures to asssit in the management of knee pain    Status New    Target Date 11/08/20             PT Long Term Goals - 11/08/20 1332      PT LONG TERM GOAL #5   Title Pts 5xSTS s use of hands time will decrease to 11 sec or less as indication of improved function    Status New    Target Date 12/07/20      Additional Long Term Goals   Additional Long Term Goals Yes      PT LONG TERM GOAL #6   Title Pt's distance will increase to 257ft s need for a rest break as indication of improved functional tolerance    Baseline 273ft c 1 rest break    Status New                  Plan - 11/12/20 1908    Clinical Impression Statement Pt presents today with significant L foot pain. Pt has a chronic issue of foot pain with a Hx of fallen arches exercises were provided for flexibility and strengthening of the feet as well as a tennis ball for massage. DIscussed shoe inserts which she has found helpful in the past. Pt was continued for knee/LE and core strengthening. Pt tolerated today's session without adverse effects. Will look to progress ther exs as indicated.    Personal Factors and Comorbidities Time since onset of injury/illness/exacerbation;Past/Current Experience;Comorbidity 3+    Comorbidities morbid obesity, fallen arch, depression, HIV    Examination-Activity Limitations Locomotion Level;Squat;Stairs;Stand    Examination-Participation Restrictions Shop;Cleaning    Stability/Clinical Decision Making Evolving/Moderate complexity    Clinical Decision Making Moderate    Rehab Potential Fair    PT Frequency 2x / week    PT Duration 6 weeks    PT Treatment/Interventions ADLs/Self Care Home Management;Electrical Stimulation;Iontophoresis 4mg /ml Dexamethasone;Moist Heat;Balance training;Therapeutic exercise;Therapeutic activities;Functional mobility training;Stair training;Gait training;Patient/family education;Manual techniques;Passive range of motion;Taping    PT Next Visit Plan Assess STGs next session. Assess reponse to HEP    PT Home Exercise Plan    Consulted and Agree with Plan of Care Patient           Patient will benefit from skilled therapeutic intervention in order to improve the following deficits and impairments:  Pain,Obesity,Difficulty walking,Decreased endurance,Decreased activity tolerance,Decreased strength,Decreased balance  Visit Diagnosis: Patellofemoral arthralgia of both knees  Morbid obesity (HCC)  Decreased range of motion (ROM) of left knee  Decreased range of motion (ROM) of right knee  Muscle  weakness (generalized)  Facial weakness  Acute pain of right shoulder  Stiffness of right shoulder, not elsewhere classified     Problem List Patient Active Problem List   Diagnosis Date Noted  . Patellofemoral arthralgia of both knees 03/25/2020  . Shoulder injury  01/31/2020  . Hemorrhoid 04/05/2019  . Healthcare maintenance 04/05/2019  . Migraine 09/17/2018  . Venous insufficiency 09/16/2018  . Abnormal uterine bleeding (AUB) 02/04/2018  . Morbid obesity (HCC) 04/19/2017  . 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 HTN (hypertension) 10/23/2016  . Human immunodeficiency virus I infection (HCC) 10/02/2016  . Poor dentition 10/02/2016  . Depression 11/28/2010  . Obstructive sleep apnea 08/20/2010   Joellyn Rued MS, PT 11/12/20 7:19 PM  [facility] [address] [place], [place], [address] Phone: [phone]   Fax:  [phone]  Name: Claudia Brady MRN: [id number] Date of Birth: June 02, 1961

## 2020-11-13 ENCOUNTER — Other Ambulatory Visit: Payer: Self-pay | Admitting: Internal Medicine

## 2020-11-13 DIAGNOSIS — F419 Anxiety disorder, unspecified: Secondary | ICD-10-CM

## 2020-11-13 NOTE — Telephone Encounter (Signed)
Next appt scheduled 11/15/20 with Dr Gwyneth Revels.

## 2020-11-13 NOTE — Progress Notes (Deleted)
   CC: ***  HPI:  [name] is a 60 y.o.   Past Medical History:  Diagnosis Date  . ATRIAL FIBRILLATION 08/20/2010   Qualifier: History of  By: Excell Seltzer CMA, Lawson Fiscal    . Chronic pain   . Duodenal ulcer Jan. 2012   Upper GI bleeding 2nd to NSAID use  . GERD (gastroesophageal reflux disease)   . HIV (human immunodeficiency virus infection) (HCC) 2018  . Hypertension   . Iron deficiency anemia   . OSA (obstructive sleep apnea)   . Plantar fascia syndrome   . RESTLESS LEG SYNDROME 08/20/2010       . Rhinitis   . Varicose vein of leg    Review of Systems:  ***  Physical Exam:  There were no vitals filed for this visit. ***  Assessment & Plan:   See Encounters Tab for problem based charting.  Patient {GC/GE:[id number]::"discussed with","seen with"} Dr. {NAMES:[id number]::"Butcher","Guilloud","Hoffman","Mullen","Narendra","Raines","Vincent"}

## 2020-11-14 ENCOUNTER — Ambulatory Visit: Payer: Medicare Other

## 2020-11-15 ENCOUNTER — Encounter: Payer: Medicare Other | Admitting: Internal Medicine

## 2020-11-15 DIAGNOSIS — I10 Essential (primary) hypertension: Secondary | ICD-10-CM

## 2020-11-19 ENCOUNTER — Other Ambulatory Visit: Payer: Self-pay

## 2020-11-19 ENCOUNTER — Ambulatory Visit: Payer: Medicare Other

## 2020-11-19 DIAGNOSIS — M25611 Stiffness of right shoulder, not elsewhere classified: Secondary | ICD-10-CM | POA: Diagnosis not present

## 2020-11-19 DIAGNOSIS — R29810 Facial weakness: Secondary | ICD-10-CM | POA: Diagnosis not present

## 2020-11-19 DIAGNOSIS — M6281 Muscle weakness (generalized): Secondary | ICD-10-CM

## 2020-11-19 DIAGNOSIS — M222X2 Patellofemoral disorders, left knee: Secondary | ICD-10-CM

## 2020-11-19 DIAGNOSIS — M222X1 Patellofemoral disorders, right knee: Secondary | ICD-10-CM | POA: Diagnosis not present

## 2020-11-19 DIAGNOSIS — M25661 Stiffness of right knee, not elsewhere classified: Secondary | ICD-10-CM

## 2020-11-19 DIAGNOSIS — M25662 Stiffness of left knee, not elsewhere classified: Secondary | ICD-10-CM

## 2020-11-19 DIAGNOSIS — M25511 Pain in right shoulder: Secondary | ICD-10-CM | POA: Diagnosis not present

## 2020-11-19 NOTE — Therapy (Signed)
[facility] [address] [place], [place], [address] Phone: [phone]   Fax:  [phone]  Physical Therapy Treatment  Patient Details  Name: Claudia Brady MRN: [id number] Date of Birth: Dec 07, 1960 Referring Provider (PT): Anne Shutter, MD   Encounter Date: 11/19/2020   PT End of Session - 11/19/20 2108    Visit Number 4    Number of Visits 13    Date for PT Re-Evaluation 12/07/20    Authorization Type UHC MEDICARE    PT Start Time 1546    PT Stop Time 1620    PT Time Calculation (min) 34 min    Activity Tolerance Patient tolerated treatment well    Behavior During Therapy [facility] for tasks assessed/performed           Past Medical History:  Diagnosis Date  . ATRIAL FIBRILLATION 08/20/2010   Qualifier: History of  By: Excell Seltzer CMA, Lawson Fiscal    . Chronic pain   . Duodenal ulcer Jan. 2012   Upper GI bleeding 2nd to NSAID use  . GERD (gastroesophageal reflux disease)   . HIV (human immunodeficiency virus infection) (HCC) 2018  . Hypertension   . Iron deficiency anemia   . OSA (obstructive sleep apnea)   . Plantar fascia syndrome   . RESTLESS LEG SYNDROME 08/20/2010       . Rhinitis   . Varicose vein of leg     Past Surgical History:  Procedure Laterality Date  . FOOT SURGERY    . TONSILLECTOMY    . TUBAL LIGATION      There were no vitals filed for this visit.   Subjective Assessment - 11/19/20 1553    Subjective My foot pain is much better today. The rainy weather we had last week madee it wrose.    Patient Stated Goals Strength, endurance, less pain    Currently in Pain? Yes    Pain Score 2     Pain Location Knee    Pain Orientation Right;Left    Pain Descriptors / Indicators Nagging    Pain Type Chronic pain    Pain Onset More than a month ago    Pain Frequency Intermittent    Pain Score 3    Pain Location Foot    Pain Orientation Right;Left    Pain Descriptors / Indicators Aching    Pain Type  Chronic pain    Pain Onset More than a month ago    Pain Frequency Intermittent                             [facility] Adult PT Treatment/Exercise - 11/19/20 0001      Ambulation/Gait   Gait Comments 2MWT=147ft c 1 rest break      Exercises   Exercises Knee/Hip;Ankle      Knee/Hip Exercises: Seated   Long Arc Quad Right;Left;10 reps    Long Arc Quad Weight 2 lbs.    Sit to Sand 2 sets;5 reps;without UE support      Knee/Hip Exercises: Supine   Bridges Both;10 reps    Straight Leg Raises Right;Left;10 reps    Straight Leg Raises Limitations 2 lbs    Other Supine Knee/Hip Exercises Marching; 20x      Knee/Hip Exercises: Sidelying   Hip ABduction Right;Left;10 reps    Hip ABduction Limitations 2 lbs    Clams Right;Left;10 reps;    Other Sidelying Knee/Hip Exercises Right;Left;10 reps; 2lbs  PT Short Term Goals - 11/19/20 1612      PT SHORT TERM GOAL #1   Title Independent with initial HEP    Status Achieved    Target Date 11/19/20      PT SHORT TERM GOAL #2   Title Pt will voice understanding of measures to asssit in the management of knee pain. 11/19/20: Managing knee pain with exercise    Status Achieved    Target Date 11/19/20             PT Long Term Goals - 11/08/20 1332      PT LONG TERM GOAL #5   Title Pts 5xSTS s use of hands time will decrease to 11 sec or less as indication of improved function    Status New    Target Date 12/07/20      Additional Long Term Goals   Additional Long Term Goals Yes      PT LONG TERM GOAL #6   Title Pt's distance will increase to 253ft s need for a rest break as indication of improved functional tolerance    Baseline 290ft c 1 rest break    Status New                 Plan - 11/19/20 1610    Clinical Impression Statement Pt's chronic foot pain has decreased since last week, and was able to better participate in PT. Pt reports she has found her shoe inserts, but  is not using them as of yet. Pt states her knee pain is improving and she is completing her HEP consistently. PT was completed for knee/LE strengthening in the open chain. Pt was able to tolerate a cuff weight for resistance and more continuous reps. Walking was completed for 2 mins with pt's walking pace and tolerance improved.    Personal Factors and Comorbidities Time since onset of injury/illness/exacerbation;Past/Current Experience;Comorbidity 3+    Comorbidities morbid obesity, fallen arch, depression, HIV    Examination-Activity Limitations Locomotion Level;Squat;Stairs;Stand    Examination-Participation Restrictions Shop;Cleaning    Stability/Clinical Decision Making Evolving/Moderate complexity    Clinical Decision Making Moderate    Rehab Potential Fair    PT Frequency 2x / week    PT Duration 6 weeks    PT Treatment/Interventions ADLs/Self Care Home Management;Electrical Stimulation;Iontophoresis 4mg /ml Dexamethasone;Moist Heat;Balance training;Therapeutic exercise;Therapeutic activities;Functional mobility training;Stair training;Gait training;Patient/family education;Manual techniques;Passive range of motion;Taping    PT Next Visit Plan Progress to more closed chain exs    PT Home Exercise Plan    Consulted and Agree with Plan of Care Patient           Patient will benefit from skilled therapeutic intervention in order to improve the following deficits and impairments:  Pain,Obesity,Difficulty walking,Decreased endurance,Decreased activity tolerance,Decreased strength,Decreased balance  Visit Diagnosis: Patellofemoral arthralgia of both knees  Decreased range of motion (ROM) of left knee  Decreased range of motion (ROM) of right knee  Morbid obesity (HCC)  Muscle weakness (generalized)     Problem List Patient Active Problem List   Diagnosis Date Noted  . Patellofemoral arthralgia of both knees 03/25/2020  . Shoulder injury 01/31/2020  . Hemorrhoid  04/05/2019  . Healthcare maintenance 04/05/2019  . Migraine 09/17/2018  . Venous insufficiency 09/16/2018  . Abnormal uterine bleeding (AUB) 02/04/2018  . Morbid obesity (HCC) 04/19/2017  . 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 HTN (hypertension) 10/23/2016  . Human immunodeficiency virus I infection (HCC) 10/02/2016  . Poor dentition 10/02/2016  . Depression 11/28/2010  .  Obstructive sleep apnea 08/20/2010    Joellyn Rued MS, PT 11/19/20 9:27 PM  [facility] [address] [place], [place], [address] Phone: [phone]   Fax:  [phone]  Name: Lyndie Vanderloop MRN: [id number] Date of Birth: March 13, 1961

## 2020-11-21 ENCOUNTER — Other Ambulatory Visit: Payer: Self-pay

## 2020-11-21 ENCOUNTER — Telehealth: Payer: Self-pay

## 2020-11-21 ENCOUNTER — Ambulatory Visit: Payer: Medicare Other

## 2020-11-21 DIAGNOSIS — M222X1 Patellofemoral disorders, right knee: Secondary | ICD-10-CM

## 2020-11-21 DIAGNOSIS — M222X2 Patellofemoral disorders, left knee: Secondary | ICD-10-CM | POA: Diagnosis not present

## 2020-11-21 DIAGNOSIS — M25611 Stiffness of right shoulder, not elsewhere classified: Secondary | ICD-10-CM

## 2020-11-21 DIAGNOSIS — R29810 Facial weakness: Secondary | ICD-10-CM | POA: Diagnosis not present

## 2020-11-21 DIAGNOSIS — M25662 Stiffness of left knee, not elsewhere classified: Secondary | ICD-10-CM | POA: Diagnosis not present

## 2020-11-21 DIAGNOSIS — M25661 Stiffness of right knee, not elsewhere classified: Secondary | ICD-10-CM | POA: Diagnosis not present

## 2020-11-21 DIAGNOSIS — M6281 Muscle weakness (generalized): Secondary | ICD-10-CM | POA: Diagnosis not present

## 2020-11-21 DIAGNOSIS — M25511 Pain in right shoulder: Secondary | ICD-10-CM

## 2020-11-21 NOTE — Telephone Encounter (Signed)
I called pt - informed delstrigo was refilled 08/15/20 by Dr Drue Second with 5 RF's. I asked if she had called the pharmacy - stated she did not. Informed pt to call the pharmacy - stated she will.

## 2020-11-21 NOTE — Telephone Encounter (Signed)
Need refill on doravirin-lamivudin-tenofov df (DELSTRIGO) 100-300-300 MG TABS per tablet  ;pt contact [phone]   [id number] - [place], [place] - [address]

## 2020-11-22 NOTE — Therapy (Signed)
[facility] Outpatient Rehabilitation [facility] [address] [place], [place], [address] Phone: [phone]   Fax:  [phone]  Physical Therapy Treatment  Patient Details  Name: Claudia Brady MRN: [id number] Date of Birth: 12/01/1960 Referring Provider (PT): Anne Shutter, MD   Encounter Date: 11/21/2020   PT End of Session - 11/21/20 1403    Visit Number 5    Number of Visits 13    Date for PT Re-Evaluation 12/07/20    Authorization Type UHC MEDICARE    Authorization Time Period MEDICAID [place] ACCESS    PT Start Time 0200    PT Stop Time 0243    PT Time Calculation (min) 43 min    Activity Tolerance Patient tolerated treatment well    Behavior During Therapy [facility] for tasks assessed/performed           Past Medical History:  Diagnosis Date  . ATRIAL FIBRILLATION 08/20/2010   Qualifier: History of  By: Excell Seltzer CMA, Lawson Fiscal    . Chronic pain   . Duodenal ulcer Jan. 2012   Upper GI bleeding 2nd to NSAID use  . GERD (gastroesophageal reflux disease)   . HIV (human immunodeficiency virus infection) (HCC) 2018  . Hypertension   . Iron deficiency anemia   . OSA (obstructive sleep apnea)   . Plantar fascia syndrome   . RESTLESS LEG SYNDROME 08/20/2010       . Rhinitis   . Varicose vein of leg     Past Surgical History:  Procedure Laterality Date  . FOOT SURGERY    . TONSILLECTOMY    . TUBAL LIGATION      There were no vitals filed for this visit.   Subjective Assessment - 11/21/20 1408    Subjective Pt reports she is having a good day. No knee pain and low foot pain.    Patient Stated Goals Strength, endurance, less pain    Currently in Pain? Yes    Pain Score 0-No pain    Pain Orientation Right;Left    Pain Type Chronic pain    Pain Onset More than a month ago    Pain Frequency Intermittent    Pain Score 3    Pain Orientation Right    Pain Descriptors / Indicators Aching    Pain Type Chronic pain    Pain Onset More than a month ago     Pain Frequency Intermittent                             [facility] Adult PT Treatment/Exercise - 11/22/20 0001      Ambulation/Gait   Gait Comments 3, 1 min walks c an approx 1 min rest between each walk.      Exercises   Exercises Knee/Hip;Ankle      Knee/Hip Exercises: Standing   Hip Extension Right;Left;10 reps    Extension Limitations assist c 2 hands      Knee/Hip Exercises: Supine   Bridges Both;10 reps    Straight Leg Raises Right;Left;10 reps    Straight Leg Raises Limitations 3 lbs    Other Supine Knee/Hip Exercises Marching; 20x      Knee/Hip Exercises: Sidelying   Hip ABduction Right;Left;10 reps    Hip ABduction Limitations 3 lbs    Clams Right;Left;10 reps;    Other Sidelying Knee/Hip Exercises Right;Left;10 reps; 2lbs                  PT Education -  11/21/20 1429    Education Details Discussed walking program: See Plan    Person(s) Educated Patient    Methods Explanation    Comprehension Verbalized understanding            PT Short Term Goals - 11/22/20 0721      PT SHORT TERM GOAL #1   Title Independent with initial HEP    Status Achieved    Target Date 11/19/20      PT SHORT TERM GOAL #2   Title Pt will voice understanding of measures to asssit in the management of knee pain. 11/19/20: Managing knee pain with exercise    Status Achieved    Target Date 11/19/20             PT Long Term Goals - 11/08/20 1332      PT LONG TERM GOAL #5   Title Pts 5xSTS s use of hands time will decrease to 11 sec or less as indication of improved function    Status New    Target Date 12/07/20      Additional Long Term Goals   Additional Long Term Goals Yes      PT LONG TERM GOAL #6   Title Pt's distance will increase to 265ft s need for a rest break as indication of improved functional tolerance    Baseline 248ft c 1 rest break    Status New                 Plan - 11/21/20 1403    Clinical Impression Statement  Continued PT for LE strengthening and activity tolerance. Discussed with pt a walking program for 3 to 5, 1 min walks 2 to 4 times a day to improve endurance. Pt's reports good improvement in her bilat knee pain. Pt is able to walk for approx 1 min before needing to sit down due to DOE.    Personal Factors and Comorbidities Time since onset of injury/illness/exacerbation;Past/Current Experience;Comorbidity 3+    Comorbidities morbid obesity, fallen arch, depression, HIV    Examination-Activity Limitations Locomotion Level;Squat;Stairs;Stand    Examination-Participation Restrictions Shop;Cleaning    Stability/Clinical Decision Making Evolving/Moderate complexity    Clinical Decision Making Moderate    Rehab Potential Fair    PT Frequency 2x / week    PT Duration 6 weeks    PT Treatment/Interventions ADLs/Self Care Home Management;Electrical Stimulation;Iontophoresis 4mg /ml Dexamethasone;Moist Heat;Balance training;Therapeutic exercise;Therapeutic activities;Functional mobility training;Stair training;Gait training;Patient/family education;Manual techniques;Passive range of motion;Taping    PT Next Visit Plan Progress to closed chain exs for strengthening and continue activites to address activity tolerance.    PT Home Exercise Plan    Consulted and Agree with Plan of Care Patient           Patient will benefit from skilled therapeutic intervention in order to improve the following deficits and impairments:  Pain,Obesity,Difficulty walking,Decreased endurance,Decreased activity tolerance,Decreased strength,Decreased balance  Visit Diagnosis: Patellofemoral arthralgia of both knees  Decreased range of motion (ROM) of left knee  Decreased range of motion (ROM) of right knee  Morbid obesity (HCC)  Muscle weakness (generalized)  Facial weakness  Acute pain of right shoulder  Stiffness of right shoulder, not elsewhere classified     Problem List Patient Active Problem List    Diagnosis Date Noted  . Patellofemoral arthralgia of both knees 03/25/2020  . Shoulder injury 01/31/2020  . Hemorrhoid 04/05/2019  . Healthcare maintenance 04/05/2019  . Migraine 09/17/2018  . Venous insufficiency 09/16/2018  . Abnormal uterine  bleeding (AUB) 02/04/2018  . Morbid obesity (HCC) 04/19/2017  . 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 HTN (hypertension) 10/23/2016  . Human immunodeficiency virus I infection (HCC) 10/02/2016  . Poor dentition 10/02/2016  . Depression 11/28/2010  . Obstructive sleep apnea 08/20/2010    Joellyn Rued MS, PT 11/22/20 7:24 AM  [facility] [address] [place], [place], [address] Phone: [phone]   Fax:  [phone]  Name: Claudia Brady MRN: [id number] Date of Birth: 1960-10-21

## 2020-11-25 ENCOUNTER — Telehealth: Payer: Self-pay | Admitting: *Deleted

## 2020-11-25 DIAGNOSIS — G4733 Obstructive sleep apnea (adult) (pediatric): Secondary | ICD-10-CM

## 2020-11-25 NOTE — Telephone Encounter (Addendum)
Call from [facility],Farra Nikolic - stated pt is currently in their office. They had received an order for BIPAP pressure setting of 26/22 cm H2O. Stated that machine had been recalled; their current machine goes up to 25/22 cm. Stated she can take a verbal order now since pt is in the office but will also need one faxed. Talked to Alba Cory, who is our Control and instrumentation engineer. Stated she will call [facility].

## 2020-11-25 NOTE — Telephone Encounter (Signed)
25/22 is appropriate. I am happy to sign an updated order if needed.

## 2020-11-25 NOTE — Telephone Encounter (Signed)
Pt currently at [facility] to pick up Bipap machine.  Settings were sent in for 26/22 cm H2O.   [facility] now using a new machine type (due to a recall) and machine only goes to 25/22 [facility] requesting new order to represent these settings  D/W attending (Vincent)-v/o given for new settings CMA will have attending sign new order.Kingsley Spittle [place]5/16/20223:46 PM   Once new order is signed, CMA will inform [facility] via [employer].Kingsley Spittle Cass[place]5/16/20223:51 PM

## 2020-11-25 NOTE — Telephone Encounter (Signed)
RE: Order for DME BIPAP Received: 2 weeks ago Brady, Claudia Brady, Claudia Brady, CMA; Brady, Claudia Brady; [place], Claudia Brady Good morning,   This order has been pending the updated orders for review. We were never notified of the updated orders from MD. I went back through all my messages to verify.  I will print the orders now and push this over to the processing team for review. We are still in a shortage of units and will process as soon as we can. I will also message the referral team to see if there is any way we can speed this one up.   Thank you,   Claudia Brady        Previous Messages   ----- Message -----  From: Claudia Brady, CMA  Sent: 11/06/2020  9:30 AM EDT  To: Claudia Brady  Subject: RE: Order for DME BIPAP              Can you check on the status of this referral. MD placed the Brady updated bipap order on 09/25/20. I am aware of cpap/bipaps being on backorder, but pt is calling.  Thanks  Claudia Brady "Claudia Brady" [facility]

## 2020-11-28 ENCOUNTER — Ambulatory Visit: Payer: Medicare Other

## 2020-11-29 ENCOUNTER — Ambulatory Visit: Payer: Medicare Other

## 2020-11-30 ENCOUNTER — Other Ambulatory Visit: Payer: Self-pay | Admitting: Internal Medicine

## 2020-11-30 DIAGNOSIS — I10 Essential (primary) hypertension: Secondary | ICD-10-CM

## 2020-12-03 ENCOUNTER — Encounter: Payer: Self-pay | Admitting: Physical Therapy

## 2020-12-03 ENCOUNTER — Ambulatory Visit: Payer: Medicare Other | Admitting: Physical Therapy

## 2020-12-03 ENCOUNTER — Other Ambulatory Visit: Payer: Self-pay

## 2020-12-03 DIAGNOSIS — M25661 Stiffness of right knee, not elsewhere classified: Secondary | ICD-10-CM

## 2020-12-03 DIAGNOSIS — M6281 Muscle weakness (generalized): Secondary | ICD-10-CM

## 2020-12-03 DIAGNOSIS — M222X1 Patellofemoral disorders, right knee: Secondary | ICD-10-CM | POA: Diagnosis not present

## 2020-12-03 DIAGNOSIS — M25511 Pain in right shoulder: Secondary | ICD-10-CM | POA: Diagnosis not present

## 2020-12-03 DIAGNOSIS — M222X2 Patellofemoral disorders, left knee: Secondary | ICD-10-CM | POA: Diagnosis not present

## 2020-12-03 DIAGNOSIS — M25611 Stiffness of right shoulder, not elsewhere classified: Secondary | ICD-10-CM | POA: Diagnosis not present

## 2020-12-03 DIAGNOSIS — M25662 Stiffness of left knee, not elsewhere classified: Secondary | ICD-10-CM

## 2020-12-03 DIAGNOSIS — R29810 Facial weakness: Secondary | ICD-10-CM | POA: Diagnosis not present

## 2020-12-03 NOTE — Therapy (Signed)
[place], [place], [address] Phone: [phone]   Fax:  [phone]  Physical Therapy Treatment  Patient Details  Name: Claudia Brady MRN: [id number] Date of Birth: [birth year] Referring Provider (PT): Oda Kilts, MD   Encounter Date: 12/03/2020   PT End of Session - 12/03/20 1021    Visit Number 6    Number of Visits 13    Date for PT Re-Evaluation 12/07/20    Authorization Type UHC MEDICARE    Authorization Time Period MEDICAID [place] ACCESS    PT Start Time 2993    PT Stop Time 1100    PT Time Calculation (min) 45 min           Past Medical History:  Diagnosis Date  . ATRIAL FIBRILLATION 08/20/2010   Qualifier: History of  By: Burt Knack CMA, Cecille Rubin    . Chronic pain   . Duodenal ulcer Jan. 2012   Upper GI bleeding 2nd to NSAID use  . GERD (gastroesophageal reflux disease)   . HIV (human immunodeficiency virus infection) ([place]) 2018  . Hypertension   . Iron deficiency anemia   . OSA (obstructive sleep apnea)   . Plantar fascia syndrome   . RESTLESS LEG SYNDROME 08/20/2010       . Rhinitis   . Varicose vein of leg     Past Surgical History:  Procedure Laterality Date  . FOOT SURGERY    . TONSILLECTOMY    . TUBAL LIGATION      There were no vitals filed for this visit.   Subjective Assessment - 12/03/20 1019    Subjective I had a really good day on saturday, I was out all day doing errands, shopping and rode the bus. But the next day I was exhasuted and hurt all over. My ankles were really swollen.  I have no pain today even though it is raining.    Currently in Pain? No/denies              [facility] PT Assessment - 12/03/20 0001      Standardized Balance Assessment   Standardized Balance Assessment Five Times Sit to Stand    Five times sit to stand comments  13.1                         [facility] Adult PT Treatment/Exercise - 12/03/20 0001       Ambulation/Gait   Gait Comments 2MWT=395 ft without rest   HR108 and SPO2 89% at end of 2MWT     Self-Care   Self-Care Other Self-Care Comments    Other Self-Care Comments  Trial of 16 inch Leg bolster to elevate LE due to edema      Knee/Hip Exercises: Standing   Hip Extension Right;Left;10 reps;2 sets    Extension Limitations assist c 2 hands      Knee/Hip Exercises: Seated   Sit to Sand 2 sets;5 reps;without UE support      Knee/Hip Exercises: Supine   Bridges Both;2 sets;5 reps    Other Supine Knee/Hip Exercises Marching; 20x      Knee/Hip Exercises: Sidelying   Hip ABduction Right;Left;10 reps   2 sets   Hip ABduction Limitations 3 lbs    Clams Right;Left;10 reps;   2 sets     Ankle Exercises: Stretches   Gastroc Stretch 2 reps;20 seconds   bilat  PT Short Term Goals - 11/22/20 0721      PT SHORT TERM GOAL #1   Title Independent with initial HEP    Status Achieved    Target Date 11/19/20      PT SHORT TERM GOAL #2   Title Pt will voice understanding of measures to asssit in the management of knee pain. 11/19/20: Managing knee pain with exercise    Status Achieved    Target Date 11/19/20             PT Long Term Goals - 12/03/20 1031      PT LONG TERM GOAL #1   Title Pt will be Ind in a final HEP to maintain achieved LOF    Time 6    Period Weeks    Status On-going      PT LONG TERM GOAL #2   Title Improve L knee and hip strength to 3+to 4-/5 and the R LE to 4- to 4/5 strength for improved functional mobility    Time 6    Period Weeks    Status Unable to assess      PT LONG TERM GOAL #3   Title Pt will report ed improve esae of mobility being able to walk 15 mins with need to rest    Baseline able to shop , run errands and ride the bus this past weekend , 20-30 minutes at a time-leaned on cart as needed    Time 6    Period Weeks    Status Partially Met      PT LONG TERM GOAL #4   Title Pt will report a decrease in knee  pain to 3/10 consistently and occuring  intermittently with daily activities    Baseline pt reports knee pain is more intermittent in the last 2 weeks.    Time 6    Period Weeks    Status Achieved      PT LONG TERM GOAL #5   Title Pts 5xSTS s use of hands time will decrease to 11 sec or less as indication of improved function    Baseline 13.1 12/03/20    Time 6    Period Weeks    Status On-going      PT LONG TERM GOAL #6   Title Pt's 2MWT distance will increase to 257f s need for a rest break as indication of improved functional tolerance    Baseline 395 ft without rest    Period Weeks    Status Achieved                 Plan - 12/03/20 1120    Clinical Impression Statement Pt reports improvement in endurance. She was able to spend saturday shopping and doing errands, using the bus for transportation. .She reports able to spend 20-30 minutes on her feet pushing a cart with intermittent leaning on the cart with she was SOB. Today, she completed 2MWT without rest for 395 ft. SPO2 was 89% afterward and quickly returned to 95% with rest. Continued with mat based hip strength and she was able to complete increased reps. She reports increased leg edema after activity on her feet. Education given  on the benefits of a leg bolster to assist in raising feet above her heart. Trial of leg bolster in clinic which she reported was comfortable and she may order one for home. LTG# 4 met and good progress toward remaining LTGs.    PT Next Visit Plan Consider discharge; pt will be  out of town next month and also wants to [employer] and draw bridge after vacation. Progress to closed chain exs for strengthening and continue activites to address activity tolerance.    PT Home Exercise Plan [id number]           Patient will benefit from skilled therapeutic intervention in order to improve the following deficits and impairments:  Pain,Obesity,Difficulty walking,Decreased endurance,Decreased activity  tolerance,Decreased strength,Decreased balance  Visit Diagnosis: Patellofemoral arthralgia of both knees  Decreased range of motion (ROM) of left knee  Decreased range of motion (ROM) of right knee  Muscle weakness (generalized)     Problem List Patient Active Problem List   Diagnosis Date Noted  . Patellofemoral arthralgia of both knees 03/25/2020  . Shoulder injury 01/31/2020  . Hemorrhoid 04/05/2019  . Healthcare maintenance 04/05/2019  . Migraine 09/17/2018  . Venous insufficiency 09/16/2018  . Abnormal uterine bleeding (AUB) 02/04/2018  . Morbid obesity ([place]) 04/19/2017  . 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 HTN (hypertension) 10/23/2016  . Human immunodeficiency virus I infection ([place]) 10/02/2016  . Poor dentition 10/02/2016  . Depression 11/28/2010  . Obstructive sleep apnea 08/20/2010    Dorene Ar, PTA 12/03/2020, 11:26 AM  [facility] [address] [place], [place], [address] Phone: [phone]   Fax:  [phone]  Name: Claudia Brady MRN: [id number] Date of Birth: 1960-11-04

## 2020-12-05 ENCOUNTER — Ambulatory Visit: Payer: Medicare Other

## 2020-12-05 ENCOUNTER — Encounter: Payer: Self-pay | Admitting: *Deleted

## 2020-12-06 ENCOUNTER — Telehealth: Payer: Self-pay

## 2020-12-06 NOTE — Telephone Encounter (Signed)
LVM re: no show visit on 12/05/20. Reminded pt of an upcoming appt on 12/10/20. Advised re: attendance policy.

## 2020-12-10 ENCOUNTER — Ambulatory Visit: Payer: Medicare Other

## 2020-12-10 ENCOUNTER — Other Ambulatory Visit: Payer: Self-pay

## 2020-12-10 VITALS — BP 163/90

## 2020-12-10 DIAGNOSIS — M25661 Stiffness of right knee, not elsewhere classified: Secondary | ICD-10-CM

## 2020-12-10 DIAGNOSIS — M6281 Muscle weakness (generalized): Secondary | ICD-10-CM

## 2020-12-10 DIAGNOSIS — R29810 Facial weakness: Secondary | ICD-10-CM | POA: Diagnosis not present

## 2020-12-10 DIAGNOSIS — M222X1 Patellofemoral disorders, right knee: Secondary | ICD-10-CM

## 2020-12-10 DIAGNOSIS — M222X2 Patellofemoral disorders, left knee: Secondary | ICD-10-CM | POA: Diagnosis not present

## 2020-12-10 DIAGNOSIS — M25662 Stiffness of left knee, not elsewhere classified: Secondary | ICD-10-CM | POA: Diagnosis not present

## 2020-12-10 DIAGNOSIS — M25511 Pain in right shoulder: Secondary | ICD-10-CM | POA: Diagnosis not present

## 2020-12-10 DIAGNOSIS — M25611 Stiffness of right shoulder, not elsewhere classified: Secondary | ICD-10-CM | POA: Diagnosis not present

## 2020-12-10 NOTE — Therapy (Addendum)
[place], [place], [address] Phone: [phone]   Fax:  [phone]  Physical Therapy Treatment/Re-Cert  Patient Details  Name: Claudia Brady MRN: [id number] Date of Birth: March 27, 1961 Referring Provider (PT): Oda Kilts, MD   Encounter Date: 12/10/2020   PT End of Session - 12/10/20 1024    Visit Number 7    Number of Visits 13    Date for PT Re-Evaluation 12/07/20    Authorization Type UHC MEDICARE    Authorization Time Period MEDICAID [place] ACCESS    PT Start Time 4010    PT Stop Time 1100    PT Time Calculation (min) 45 min    Activity Tolerance Patient tolerated treatment well    Behavior During Therapy [facility] for tasks assessed/performed           Past Medical History:  Diagnosis Date  . ATRIAL FIBRILLATION 08/20/2010   Qualifier: History of  By: Burt Knack CMA, Cecille Rubin    . Chronic pain   . Duodenal ulcer Jan. 2012   Upper GI bleeding 2nd to NSAID use  . GERD (gastroesophageal reflux disease)   . HIV (human immunodeficiency virus infection) ([place]) 2018  . Hypertension   . Iron deficiency anemia   . OSA (obstructive sleep apnea)   . Plantar fascia syndrome   . RESTLESS LEG SYNDROME 08/20/2010       . Rhinitis   . Varicose vein of leg     Past Surgical History:  Procedure Laterality Date  . FOOT SURGERY    . TONSILLECTOMY    . TUBAL LIGATION      Vitals:   12/10/20 1548  BP: (!) 163/90     Subjective Assessment - 12/10/20 1538    Subjective Pt reports she is continuing to do better, slowly tolerating more activity.    Patient Stated Goals Strength, endurance, less pain    Currently in Pain? No/denies    Pain Score 0-No pain    Pain Location Knee    Pain Orientation Right;Left    Pain Onset More than a month ago    Pain Frequency Occasional    Pain Relieving Factors Medication    Pain Score 0    Pain Location Foot    Pain Orientation Right                              [facility] Adult PT Treatment/Exercise - 12/10/20 0001      Exercises   Exercises Knee/Hip;Ankle      Knee/Hip Exercises: Aerobic   Nustep 5  mins; L4;      Knee/Hip Exercises: Standing   Heel Raises 10 reps;Both    Heel Raises Limitations toe lifts;assist c 2 hands    Knee Flexion Right;Left;10 reps    Knee Flexion Limitations assist c 2 hands    Hip ADduction Right;Left;10 reps    Hip ADduction Limitations assist c 2 hands    Hip Extension Right;Left;10 reps    Extension Limitations assist c 2 hands    Functional Squat 1 set;10 reps    Functional Squat Limitations assist c 2 hands                  PT Education - 12/10/20 1541    Education Details HEP was updated for standing exs    Person(s) Educated Patient    Methods Explanation;Demonstration;Tactile cues;Verbal cues;Handout    Comprehension Tactile cues required;Verbal cues  required;Returned demonstration;Verbalized understanding            PT Short Term Goals - 11/22/20 0721      PT SHORT TERM GOAL #1   Title Independent with initial HEP    Status Achieved    Target Date 11/19/20      PT SHORT TERM GOAL #2   Title Pt will voice understanding of measures to asssit in the management of knee pain. 11/19/20: Managing knee pain with exercise    Status Achieved    Target Date 11/19/20             PT Long Term Goals - 12/10/20 1550      PT LONG TERM GOAL #1   Title Pt will be Ind in a final HEP to maintain achieved LOF    Status On-going    Target Date 01/18/21      PT LONG TERM GOAL #2   Title Improve L knee and hip strength to 3+to 4-/5 and the R LE to 4- to 4/5 strength for improved functional mobility. 12/10/20: B hip flexion= 3+/5; hip abd 4/5.    Status On-going    Target Date 01/18/21      PT LONG TERM GOAL #3   Title Pt will report ed improve esae of mobility being able to walk 15 mins with need to rest    Baseline able to shop , run errands and ride the  bus this past weekend , 20-30 minutes at a time-leaned on cart as needed    Status Partially Met    Target Date 01/18/21      PT LONG TERM GOAL #4   Title Pt will report a decrease in knee pain to 3/10 consistently and occuring  intermittently with daily activities. 12/10/20: 2/10 or less cdaily activities    Status Achieved    Target Date 12/10/20      PT LONG TERM GOAL #5   Title Pts 5xSTS s use of hands time will decrease to 11 sec or less as indication of improved function    Baseline 13.1 12/03/20    Status On-going    Target Date 01/18/21      PT LONG TERM GOAL #6   Title Pt's 2MWT distance will increase to 288f s need for a rest break as indication of improved functional tolerance    Baseline 395 ft without rest    Status Achieved    Target Date 12/03/20                 Plan - 12/10/20 1542    Clinical Impression Statement PT was completed for LE/trunk strengthening exs in the [facility]. A written HEP was provided. Pt tolerated in short rest breaks after each ex due to DOE. Pt is making good progress re: knee/LE strength, knee pain and functional mobility, and would benefit from starting the aquatics program and continuing with her current land OP rehab sessions to further address remaining strength and activity tolerance deficits to optimize her functional mobility.    Personal Factors and Comorbidities Time since onset of injury/illness/exacerbation;Past/Current Experience;Comorbidity 3+    Comorbidities morbid obesity, fallen arch, depression, HIV    Examination-Activity Limitations Locomotion Level;Squat;Stairs;Stand    Examination-Participation Restrictions Shop;Cleaning    Stability/Clinical Decision Making Evolving/Moderate complexity    Clinical Decision Making Moderate    Rehab Potential Fair    PT Frequency 2x / week    PT Duration 6 weeks    PT Treatment/Interventions ADLs/Self Care Home Management;Electrical  Stimulation;Iontophoresis 65m/ml Dexamethasone;Moist  Heat;Balance training;Therapeutic exercise;Therapeutic activities;Functional mobility training;Stair training;Gait training;Patient/family education;Manual techniques;Passive range of motion;Taping    PT Next Visit Plan Following a trip the pt is to return to PT in approx 1 month to continue with land and start Aquatic PT to improve strenth, balance, safety and functional mobility.    PT Home Exercise Plan [id number]   Consulted and Agree with Plan of Care Patient           Patient will benefit from skilled therapeutic intervention in order to improve the following deficits and impairments:  Pain,Obesity,Difficulty walking,Decreased endurance,Decreased activity tolerance,Decreased strength,Decreased balance  Visit Diagnosis: Patellofemoral arthralgia of both knees  Decreased range of motion (ROM) of left knee  Muscle weakness (generalized)  Morbid obesity (HCC)  Decreased range of motion (ROM) of right knee     Problem List Patient Active Problem List   Diagnosis Date Noted  . Patellofemoral arthralgia of both knees 03/25/2020  . Shoulder injury 01/31/2020  . Hemorrhoid 04/05/2019  . Healthcare maintenance 04/05/2019  . Migraine 09/17/2018  . Venous insufficiency 09/16/2018  . Abnormal uterine bleeding (AUB) 02/04/2018  . Morbid obesity ([place] 04/19/2017  . 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 HTN (hypertension) 10/23/2016  . Human immunodeficiency virus I infection [place] 10/02/2016  . Poor dentition 10/02/2016  . Depression 11/28/2010  . Obstructive sleep apnea 08/20/2010   AGar PontoMS, PT 12/10/20 5:45 PM  [facility] [address]Phone: [phone]  Fax:  [phone] Name: MTiegan JamborMRN: [id number] of Birth: 804/22/1962

## 2020-12-10 NOTE — Addendum Note (Signed)
Addended by: Joellyn Rued on: 12/10/2020 06:01 PM   Modules accepted: Orders

## 2020-12-10 NOTE — Patient Instructions (Signed)
Aquatic Therapy at [facility]-  What to Expect!  Where:   [facility] [address] [place], [place] [address] Rehab phone [phone]  NOTE:  You will receive an automated phone message reminding you of your appt and it will say the appointment is at the [address] [facility] [address] [facility] clinic.          How to Prepare: . Please make sure you drink 8 ounces of water about one hour prior to your pool session . A caregiver may attend if needed with the patient to help assist as needed. A caregiver can sit in the pool room on chair. . Please arrive IN YOUR SUIT and 15 minutes prior to your appointment - this helps to avoid delays in starting your session. . Please make sure to attend to any toileting needs prior to entering the pool . Locker rooms for changing are provided.   There is direct access to the pool deck form the locker room.  You can lock your belongings in a locker with lock provided. . Once on the pool deck your therapist will ask if you have signed the Patient  Consent and Assignment of Benefits form before beginning treatment . Your therapist may take your blood pressure prior to, during and after your session if indicated . We usually try and create a home exercise program based on activities we do in the pool.  Please be thinking about who might be able to assist you in the pool should you need to participate in an aquatic home exercise program at the time of discharge if you need assistance.  Some patients do not want to or do not have the ability to participate in an aquatic home program - this is not a barrier in any way to you participating in aquatic therapy as part of your current therapy plan! . After Discharge from PT, you can continue using home program at  the [facility]/, there is a drop-in fee for $5 ($45 a month)or for 60 years  or older $4.00 ($40 a month for seniors ) or any local [facility] pool.  Memberships  for purchase are available for gym/pool at [facility]  It is very important that your last visit be in the clinic at [address] after your last aquatic visit.    Please make sure that you have a land/[address] on the calendar.   About the pool: 1. Pool is located 500 FT from the entrance of the building.  2. Entering the pool Your therapist will assist you; there are multiple ways to enter including stairs with railings, a walk in ramp, a roll in chair and a mechanical lift. Your therapist will determine the most appropriate way for you. 3. Water temperature is usually between 86-87 degrees 4. There may be other swimmers in the pool at the same time      Contact Info: For scheduling and cancellations        Appointments: Please call the [facility]    [phone]             Center if you need to cancel or reschedule an appointment at [phone]              Pool. Outpatient Rehabilitation @ [facility]         All sessions are 45 minutes [address] [place],  [address]                                                  

## 2020-12-11 NOTE — Addendum Note (Signed)
Addended by: Joellyn Rued on: 12/11/2020 08:39 AM   Modules accepted: Orders

## 2020-12-12 ENCOUNTER — Encounter: Payer: Self-pay | Admitting: Internal Medicine

## 2020-12-12 DIAGNOSIS — Z1231 Encounter for screening mammogram for malignant neoplasm of breast: Secondary | ICD-10-CM

## 2020-12-26 ENCOUNTER — Encounter: Payer: Self-pay | Admitting: *Deleted

## 2020-12-27 ENCOUNTER — Telehealth: Payer: Self-pay

## 2020-12-27 NOTE — Telephone Encounter (Signed)
Please review: Per chart on 3/8, pt's losartan was increased to 100mg  daily LOV on 3/16 pt was to cont Losartan 100mg  daily.  Please correct current Sig as there are 2 different directions and please change mode to normal before sending refill. Thank you, SChaplin, RN,BSN

## 2020-12-27 NOTE — Telephone Encounter (Signed)
  losartan (COZAAR) 50 MG tablet, refill request @  [id number] - [place], [place] - [address] AT [facility] [address] [facility] [address] Phone:  [phone]  Fax:  [phone]

## 2020-12-31 MED ORDER — LOSARTAN POTASSIUM 100 MG PO TABS: 100.0000 mg | ORAL_TABLET | Freq: Every day | ORAL | 3 refills | Status: DC

## 2021-01-07 NOTE — Addendum Note (Signed)
Addended by: Neomia Dear on: 01/07/2021 06:34 PM   Modules accepted: Orders

## 2021-01-08 ENCOUNTER — Other Ambulatory Visit: Payer: Self-pay | Admitting: Family

## 2021-01-08 DIAGNOSIS — B20 Human immunodeficiency virus [HIV] disease: Secondary | ICD-10-CM

## 2021-01-09 ENCOUNTER — Ambulatory Visit: Payer: Medicare Other | Attending: Internal Medicine

## 2021-01-09 ENCOUNTER — Other Ambulatory Visit: Payer: Self-pay

## 2021-01-09 DIAGNOSIS — M25662 Stiffness of left knee, not elsewhere classified: Secondary | ICD-10-CM | POA: Diagnosis not present

## 2021-01-09 DIAGNOSIS — M222X1 Patellofemoral disorders, right knee: Secondary | ICD-10-CM | POA: Insufficient documentation

## 2021-01-09 DIAGNOSIS — M545 Low back pain, unspecified: Secondary | ICD-10-CM | POA: Diagnosis not present

## 2021-01-09 DIAGNOSIS — M6281 Muscle weakness (generalized): Secondary | ICD-10-CM | POA: Insufficient documentation

## 2021-01-09 DIAGNOSIS — M222X2 Patellofemoral disorders, left knee: Secondary | ICD-10-CM

## 2021-01-09 DIAGNOSIS — G8929 Other chronic pain: Secondary | ICD-10-CM | POA: Insufficient documentation

## 2021-01-09 DIAGNOSIS — M25661 Stiffness of right knee, not elsewhere classified: Secondary | ICD-10-CM | POA: Insufficient documentation

## 2021-01-09 NOTE — Therapy (Signed)
[facility] [address] [place], [place], [address] Phone: [phone]   Fax:  [phone]  Physical Therapy Treatment  Patient Details  Name: Claudia Brady MRN: [id number] Date of Birth: Apr 18, 1961 Referring Provider (PT): Anne Shutter, MD   Encounter Date: 01/09/2021   PT End of Session - 01/09/21 1117     Visit Number 8    Number of Visits 13    Date for PT Re-Evaluation 01/18/21    Authorization Type UHC MEDICARE    Authorization Time Period MEDICAID [place] ACCESS    PT Start Time 1110    PT Stop Time 1150    PT Time Calculation (min) 40 min    Activity Tolerance Patient tolerated treatment well    Behavior During Therapy [facility] for tasks assessed/performed             Past Medical History:  Diagnosis Date   ATRIAL FIBRILLATION 08/20/2010   Qualifier: History of  By: Excell Seltzer CMA, Lori     Chronic pain    Duodenal ulcer Jan. 2012   Upper GI bleeding 2nd to NSAID use   GERD (gastroesophageal reflux disease)    HIV (human immunodeficiency virus infection) (H[facility]) 2018   Hypertension    Iron deficiency anemia    OSA (obstructive sleep apnea)    Plantar fascia syndrome    RESTLESS LEG SYNDROME 08/20/2010        Rhinitis    Varicose vein of leg     Past Surgical History:  Procedure Laterality Date   FOOT SURGERY     TONSILLECTOMY     TUBAL LIGATION      There were no vitals filed for this visit.   Subjective Assessment - 01/09/21 1113     Subjective I feel better. I put socks on more easily, walk more, and complete activites in home c breaks as needed due SOB and low back back. Pt reports being to walk and shop for 20 mins before needing to stop and rest. Pt reports her knee pain has improved 50% with it primarily occuring with low sitting or t/f low sitting.    Currently in Pain? No/denies    Pain Score 0-No pain    Pain Location Knee    Pain Orientation Right;Left                                [facility] Adult PT Treatment/Exercise - 01/09/21 0001       Exercises   Exercises Lumbar      Lumbar Exercises: Seated   Other Seated Lumbar Exercises Seated trunk forward and lateral flexion c Tball 2x, 15 sec each      Lumbar Exercises: Supine   Pelvic Tilt 15 reps   3 sec   Pelvic Tilt Limitations hands sliding up thighs to engage abs    Bent Knee Raise --   6 reps x 3 sets, core engagement   Bridge 15 reps                    PT Education - 01/09/21 1231     Education Details Updated HEP for low back mobility and strengthening. Info for [employer]) Educated Patient    Methods Explanation;Demonstration;Tactile cues;Verbal cues;Handout    Comprehension Verbalized understanding;Returned demonstration;Verbal cues required;Tactile cues required;Need further instruction              PT Short Term  Goals - 11/22/20 0721       PT SHORT TERM GOAL #1   Title Independent with initial HEP    Status Achieved    Target Date 11/19/20      PT SHORT TERM GOAL #2   Title Pt will voice understanding of measures to asssit in the management of knee pain. 11/19/20: Managing knee pain with exercise    Status Achieved    Target Date 11/19/20               PT Long Term Goals - 01/09/21 1306       PT LONG TERM GOAL #3   Title Pt will report ed improve esae of mobility being able to walk 15 mins with need to rest. 01/09/21: Pt reports 20 mins of shopping s the assist of a cart    Baseline able to shop , run errands and ride the bus this past weekend , 20-30 minutes at a time-leaned on cart as needed    Status On-going    Target Date 01/18/21                   Plan - 01/09/21 1234     Clinical Impression Statement Pt returns to PT after being out of state visiting family for approx 1 month. Pt is pleased with how she is progressing re: pain and activity level. PT today focused on adressing her low back pain she  experieces with prolonged standing and walking. Therex was completed for lumbopelvic mobility and strengthening. These exs were added to her HEP. Pt tolerated the initiation of back exs without adverse effects.    Personal Factors and Comorbidities Time since onset of injury/illness/exacerbation;Past/Current Experience;Comorbidity 3+    Comorbidities morbid obesity, fallen arch, depression, HIV    Examination-Activity Limitations Locomotion Level;Squat;Stairs;Stand    Examination-Participation Restrictions Shop;Cleaning    Stability/Clinical Decision Making Evolving/Moderate complexity    Clinical Decision Making Moderate    Rehab Potential Fair    PT Frequency 2x / week    PT Duration 6 weeks    PT Treatment/Interventions ADLs/Self Care Home Management;Electrical Stimulation;Iontophoresis 4mg /ml Dexamethasone;Moist Heat;Balance training;Therapeutic exercise;Therapeutic activities;Functional mobility training;Stair training;Gait training;Patient/family education;Manual techniques;Passive range of motion;Taping    PT Next Visit Plan Start Aquatic PT to improve strenth, balance, safety and functional mobility.    PT Home Exercise Plan    Consulted and Agree with Plan of Care Patient             Patient will benefit from skilled therapeutic intervention in order to improve the following deficits and impairments:  Pain, Obesity, Difficulty walking, Decreased endurance, Decreased activity tolerance, Decreased strength, Decreased balance  Visit Diagnosis: Patellofemoral arthralgia of both knees  Decreased range of motion (ROM) of left knee  Muscle weakness (generalized)  Morbid obesity (HCC)  Decreased range of motion (ROM) of right knee  Chronic midline low back pain without sciatica     Problem List Patient Active Problem List   Diagnosis Date Noted   Patellofemoral arthralgia of both knees 03/25/2020   Shoulder injury 01/31/2020   Hemorrhoid 04/05/2019   Healthcare  maintenance 04/05/2019   Migraine 09/17/2018   Venous insufficiency 09/16/2018   Abnormal uterine bleeding (AUB) 02/04/2018   Morbid obesity (HCC) 04/19/2017   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

## 2021-01-09 NOTE — Patient Instructions (Signed)
Aquatic Therapy at [facility]-  What to Expect!  Where:   [facility] [address] [place], [place] [address] Rehab phone [phone]  NOTE:  You will receive an automated phone message reminding you of your appt and it will say the appointment is at the [address] [facility] [address] [facility] clinic.          How to Prepare: Please make sure you drink 8 ounces of water about one hour prior to your pool session A caregiver may attend if needed with the patient to help assist as needed. A caregiver can sit in the pool room on chair. Please arrive IN YOUR SUIT and 15 minutes prior to your appointment - this helps to avoid delays in starting your session. Please make sure to attend to any toileting needs prior to entering the pool Locker rooms for changing are provided.   There is direct access to the pool deck form the locker room.  You can lock your belongings in a locker with lock provided. Once on the pool deck your therapist will ask if you have signed the Patient  Consent and Assignment of Benefits form before beginning treatment Your therapist may take your blood pressure prior to, during and after your session if indicated We usually try and create a home exercise program based on activities we do in the pool.  Please be thinking about who might be able to assist you in the pool should you need to participate in an aquatic home exercise program at the time of discharge if you need assistance.  Some patients do not want to or do not have the ability to participate in an aquatic home program - this is not a barrier in any way to you participating in aquatic therapy as part of your current therapy plan! After Discharge from PT, you can continue using home program at  the [facility]/, there is a drop-in fee for $5 ($45 a month)or for 60 years  or older $4.00 ($40 a month for seniors ) or any local [employer] pool.  Memberships for purchase are  available for gym/pool at [facility]  IT IS VERY IMPORTANT THAT YOUR LAST VISIT BE IN THE CLINIC AT [address] AFTER YOUR LAST AQUATIC VISIT.  PLEASE MAKE SURE THAT YOU HAVE A LAND/[address]  APPOINTMENT SCHEDULED.   About the pool: Pool is located approximately 500 FT from the entrance of the building.  Please bring a support person if you need assistance traveling this      distance.   Your therapist will assist you in entering the water; there are multiple ways to           enter including stairs with railings, a walk in ramp, a roll in chair and a                      mechanical lift. Your therapist will determine the most appropriate way for you.  Water temperature is usually between 88-90 degrees  There may be up to 2 other swimmers in the pool at the same time  The pool deck is tile, please wear shoes with good traction if you prefer not to be barefoot.    Contact Info:  For appointment scheduling and cancellations:         Please call the [facility]  PH:[phone]              Aquatic Therapy  Outpatient   Rehabilitation @ [facility]       All sessions are 45 minutes                                                    

## 2021-01-10 DIAGNOSIS — G4733 Obstructive sleep apnea (adult) (pediatric): Secondary | ICD-10-CM | POA: Diagnosis not present

## 2021-01-14 ENCOUNTER — Encounter: Payer: Self-pay | Admitting: *Deleted

## 2021-01-15 ENCOUNTER — Other Ambulatory Visit: Payer: Self-pay

## 2021-01-15 ENCOUNTER — Encounter: Payer: Self-pay | Admitting: Physical Therapy

## 2021-01-15 ENCOUNTER — Ambulatory Visit: Payer: Medicare Other | Attending: Internal Medicine | Admitting: Physical Therapy

## 2021-01-15 DIAGNOSIS — G8929 Other chronic pain: Secondary | ICD-10-CM | POA: Diagnosis not present

## 2021-01-15 DIAGNOSIS — M25662 Stiffness of left knee, not elsewhere classified: Secondary | ICD-10-CM | POA: Insufficient documentation

## 2021-01-15 DIAGNOSIS — M222X2 Patellofemoral disorders, left knee: Secondary | ICD-10-CM | POA: Insufficient documentation

## 2021-01-15 DIAGNOSIS — M222X1 Patellofemoral disorders, right knee: Secondary | ICD-10-CM | POA: Insufficient documentation

## 2021-01-15 DIAGNOSIS — M545 Low back pain, unspecified: Secondary | ICD-10-CM | POA: Insufficient documentation

## 2021-01-15 DIAGNOSIS — M6281 Muscle weakness (generalized): Secondary | ICD-10-CM | POA: Insufficient documentation

## 2021-01-15 DIAGNOSIS — M25661 Stiffness of right knee, not elsewhere classified: Secondary | ICD-10-CM | POA: Diagnosis not present

## 2021-01-15 DIAGNOSIS — R29810 Facial weakness: Secondary | ICD-10-CM | POA: Diagnosis not present

## 2021-01-15 NOTE — Therapy (Signed)
[facility] [address] [place], [place], [address] Phone: [phone]   Fax:  [phone]  Physical Therapy Treatment  Patient Details  Name: Claudia Brady MRN: [id number] Date of Birth: 03-May-1961 Referring Provider (PT): Anne Shutter, MD   Encounter Date: 01/15/2021   PT End of Session - 01/15/21 1537     Visit Number 9    Number of Visits 13    Date for PT Re-Evaluation 01/18/21    Authorization Type UHC MEDICARE    Authorization Time Period MEDICAID [place] ACCESS    PT Start Time 1540    PT Stop Time 1630    PT Time Calculation (min) 50 min    Activity Tolerance Patient tolerated treatment well    Behavior During Therapy [facility] for tasks assessed/performed             Past Medical History:  Diagnosis Date   ATRIAL FIBRILLATION 08/20/2010   Qualifier: History of  By: Excell Seltzer CMA, Lori     Chronic pain    Duodenal ulcer Jan. 2012   Upper GI bleeding 2nd to NSAID use   GERD (gastroesophageal reflux disease)    HIV (human immunodeficiency virus infection) (HCC) 2018   Hypertension    Iron deficiency anemia    OSA (obstructive sleep apnea)    Plantar fascia syndrome    RESTLESS LEG SYNDROME 08/20/2010        Rhinitis    Varicose vein of leg     Past Surgical History:  Procedure Laterality Date   FOOT SURGERY     TONSILLECTOMY     TUBAL LIGATION      There were no vitals filed for this visit.   Subjective Assessment - 01/15/21 1704     Subjective Pt enters pool area and sits on bench   She does not have AD and reports feeling pretty good.  Pt reports 4/10 pain and is reduced to 2/10 after pool session.  Pt reports she does not do a lot of movement on land and reports it felt good to be able to perform more movement in the water    Patient Stated Goals Strength, endurance, less pain    Currently in Pain? Yes    Pain Score 4     Pain Location Knee    Pain Orientation Right;Left    Pain Descriptors  / Indicators Nagging    Pain Type Chronic pain    Pain Onset More than a month ago    Pain Frequency Occasional               Aquatic therapy at [facility] [address] - therapeutic pool temp 93-95 degrees Pt enters building without AD.  Treatment took place in water 3.8 to  4 ft 8 in.feet deep depending upon activity.  Pt entered and exited the pool via stair and handrails independently.     Claudia Brady entered water for aquatic therapy for first time and was introduced to principles and therapeutic effects of water as she ambulated and acclimated to pool. Marland Kitchen    She was able to ambulate across pool 15 ft x 6, then side steps x 4 and then ambulates backward x 4    She was able to use aquatic barbells while ambulating to increase abdominal engagement with exercises. At end of session pt was able to jog forward and backwards 2 x 15 ft   Pt educated on neutral posture and hip hinging in seated position with water  at chest level x 10 with stretch to low back and then x 10 with back at pool wall at external cue, VC for neck tucked to prevent hyperextension.    Runners stretch  2 x 30 sec Left and then moves into hamstring stretch  Then runners stretch on R  30 sec x 2 and then move into hamstring stretch . TC to insure proper technique. Figure 4 squat stretch with UE support for R and L x 60 sec each       Claudia Brady was able to use aquatic kickboards  submerged for increased abdominal engagement  and trunk rotation    On edge of pool with bil UE support   Pt performed LE exercise  Hip abd/add R/L 20 x each and then using 1 UE support Hip ext/flex with knee straight x 20, pt needing VC and TC for correct execution and sequencing  Marching knee/hip 90/90 x 10 and then with added pool noodle for increased resistance x 10 each leg  ham curl R/L x 20.  Squats x 20 reps with intermittent UE support x 2 sets. Lunge to target x 10 on R and the x 10 on LE Treading water in deep 4.8 feet  for 45 sec -1 min x 2 Triangle pose Cat cow with pool noodle        Bad Ragaz, Pt with lumbar belt around hips and nek doodle for neck support.  .  Pt assisted into supine floating position by lying head on shoulder of PT to get into floating position. . PT at torso and assisting with trunk left to right and vice versa to engage trunk muscles. PT then rotated trunk in order to engage abdomnal (internal and external obliques)  Emphasis on breathing techniques to draw in abdominals for support.  PT remained at head of pt to do Bad Ragaz due to pt apprehension to float       Pt requires the buoyancy of water for active assisted exercises with buoyancy supported for strengthening and AROM exercises: PT  requires the viscosity of the water for resistance with strengthening exercises Hydrostatic pressure also supports joints by unweighting joint load by at least 50 % in 3-4 feet depth water. 80% in chest to neck deep water. Water will allow for  reduced joint loading through buoyancy to help patient improve posture without excess stress and pain.                         PT Education - 01/15/21 1708     Education Details Pt educated on aquatics and  therapeutic benefits of water and began educating on initial HEP for [employer]    Person(s) Educated Patient    Methods Explanation;Demonstration;Tactile cues;Verbal cues    Comprehension Verbalized understanding;Returned demonstration              PT Short Term Goals - 11/22/20 0721       PT SHORT TERM GOAL #1   Title Independent with initial HEP    Status Achieved    Target Date 11/19/20      PT SHORT TERM GOAL #2   Title Pt will voice understanding of measures to asssit in the management of knee pain. 11/19/20: Managing knee pain with exercise    Status Achieved    Target Date 11/19/20               PT Long Term Goals - 01/09/21 1306  PT LONG TERM GOAL #3   Title Pt will report ed improve esae of  mobility being able to walk 15 mins with need to rest. 01/09/21: Pt reports 20 mins of shopping s the assist of a cart    Baseline able to shop , run errands and ride the bus this past weekend , 20-30 minutes at a time-leaned on cart as needed    Status On-going    Target Date 01/18/21                   Plan - 01/15/21 1709     Clinical Impression Statement Claudia Brady enters aquatic pool for first time and is educated on principles of aquatics and therapeutic benefits of water.  Pt  with 4/10 pain at beginning but had 0/10 in water and was able to complete all exercises asked of pt.  Pt will be educated on HEP that she can utilize at local pool to continure benefit of movement in pain free environment as she continues to compete goals on land.   Pt  would like to be able to cross legs and be able to apply lotion ot feet with greater ease and she was able to do figure 4 standing dtretch in water today.Pt requires the buoyancy of water for active assisted exercises with buoyancy supported for strengthening and AROM exercises: PT  requires the viscosity of the water for resistance with strengthening exercises  Hydrostatic pressure also supports joints by unweighting joint load by at least 50 % in 3-4 feet depth water. 80% in chest to neck deep water.    Personal Factors and Comorbidities Time since onset of injury/illness/exacerbation;Past/Current Experience;Comorbidity 3+    Comorbidities morbid obesity, fallen arch, depression, HIV    Examination-Activity Limitations Locomotion Level;Squat;Stairs;Stand    Examination-Participation Restrictions Shop;Cleaning    PT Frequency 2x / week    PT Duration 6 weeks    PT Treatment/Interventions ADLs/Self Care Home Management;Electrical Stimulation;Iontophoresis 4mg /ml Dexamethasone;Moist Heat;Balance training;Therapeutic exercise;Therapeutic activities;Functional mobility training;Stair training;Gait training;Patient/family education;Manual  techniques;Passive range of motion;Taping    PT Next Visit Plan Start Aquatic PT to improve strenth, balance, safety and functional mobility.    PT Home Exercise Plan    Consulted and Agree with Plan of Care Patient             Patient will benefit from skilled therapeutic intervention in order to improve the following deficits and impairments:  Pain, Obesity, Difficulty walking, Decreased endurance, Decreased activity tolerance, Decreased strength, Decreased balance  Visit Diagnosis: Patellofemoral arthralgia of both knees  Decreased range of motion (ROM) of left knee  Muscle weakness (generalized)  Morbid obesity (HCC)  Decreased range of motion (ROM) of right knee  Chronic midline low back pain without sciatica     Problem List Patient Active Problem List   Diagnosis Date Noted   Patellofemoral arthralgia of both knees 03/25/2020   Shoulder injury 01/31/2020   Hemorrhoid 04/05/2019   Healthcare maintenance 04/05/2019   Migraine 09/17/2018   Venous insufficiency 09/16/2018   Abnormal uterine bleeding (AUB) 02/04/2018   Morbid obesity (HCC) 04/19/2017   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

## 2021-01-23 ENCOUNTER — Other Ambulatory Visit: Payer: Self-pay

## 2021-01-23 ENCOUNTER — Ambulatory Visit: Payer: Medicare Other

## 2021-01-23 DIAGNOSIS — M6281 Muscle weakness (generalized): Secondary | ICD-10-CM | POA: Diagnosis not present

## 2021-01-23 DIAGNOSIS — M222X1 Patellofemoral disorders, right knee: Secondary | ICD-10-CM

## 2021-01-23 DIAGNOSIS — M545 Low back pain, unspecified: Secondary | ICD-10-CM | POA: Diagnosis not present

## 2021-01-23 DIAGNOSIS — R29810 Facial weakness: Secondary | ICD-10-CM | POA: Diagnosis not present

## 2021-01-23 DIAGNOSIS — M25661 Stiffness of right knee, not elsewhere classified: Secondary | ICD-10-CM | POA: Diagnosis not present

## 2021-01-23 DIAGNOSIS — M222X2 Patellofemoral disorders, left knee: Secondary | ICD-10-CM | POA: Diagnosis not present

## 2021-01-23 DIAGNOSIS — M25662 Stiffness of left knee, not elsewhere classified: Secondary | ICD-10-CM | POA: Diagnosis not present

## 2021-01-23 DIAGNOSIS — G8929 Other chronic pain: Secondary | ICD-10-CM | POA: Diagnosis not present

## 2021-01-23 NOTE — Therapy (Signed)
[facility] [address] [place], [place], [address] Phone: [phone]   Fax:  [phone]  Physical Therapy Treatment/Progress Note  Patient Details  Name: Claudia Brady MRN: [id number] Date of Birth: 12/26/1960 Referring Provider (PT): Anne Shutter, MD  Progress Note Reporting Period 10/17/20 to 01/23/21  See note below for Objective Data and Assessment of Progress/Goals.      Encounter Date: 01/23/2021   PT End of Session - 01/23/21 1105     Visit Number 10    Number of Visits 13    Date for PT Re-Evaluation 02/15/21    Authorization Type UHC MEDICARE    Authorization Time Period MEDICAID [place] ACCESS    PT Start Time 1104    PT Stop Time 1146    PT Time Calculation (min) 42 min    Activity Tolerance Patient tolerated treatment well    Behavior During Therapy WFL for tasks assessed/performed             Past Medical History:  Diagnosis Date   ATRIAL FIBRILLATION 08/20/2010   Qualifier: History of  By: Excell Seltzer CMA, Lori     Chronic pain    Duodenal ulcer Jan. 2012   Upper GI bleeding 2nd to NSAID use   GERD (gastroesophageal reflux disease)    HIV (human immunodeficiency virus infection) (HCC) 2018   Hypertension    Iron deficiency anemia    OSA (obstructive sleep apnea)    Plantar fascia syndrome    RESTLESS LEG SYNDROME 08/20/2010        Rhinitis    Varicose vein of leg     Past Surgical History:  Procedure Laterality Date   FOOT SURGERY     TONSILLECTOMY     TUBAL LIGATION      There were no vitals filed for this visit.   Subjective Assessment - 01/23/21 1110     Subjective Pt reports her aquatic session went well and then went to the pool to ex this last week end. Overall, my functional ability is much better    How long can you stand comfortably? 15-20 mins    How long can you walk comfortably? 20-30 mins    Patient Stated Goals Strength, endurance, less pain    Currently in Pain?  No/denies    Pain Location Knee    Pain Orientation Right;Left                [facility] PT Assessment - 01/23/21 0001       Transfers   Five time sit to stand comments  13.3      Ambulation/Gait   Gait Comments 2MWT=415 ft without rest   HR108 and SPO2 89% at end of     Standardized Balance Assessment   Standardized Balance Assessment Five Times Sit to Stand    Five times sit to stand comments  13.3                           [facility] Adult PT Treatment/Exercise - 01/23/21 0001       Exercises   Exercises Knee/Hip (P)       Knee/Hip Exercises: Standing   Heel Raises 10 reps;Both (P)     Heel Raises Limitations toe lifts;assist c 2 hands (P)     Knee Flexion Right;Left;10 reps (P)     Knee Flexion Limitations assist c 2 hands (P)     Hip Flexion Right;Left;10 reps (P)  Hip Flexion Limitations assist c 2 hands (P)     Hip ADduction Right;Left;10 reps (P)     Hip ADduction Limitations assist c 2 hands (P)     Hip Extension Right;Left;10 reps (P)     Extension Limitations assist c 2 hands (P)     Functional Squat 1 set;10 reps (P)     Functional Squat Limitations assist c 2 hands (P)                       PT Short Term Goals - 11/22/20 0721       PT SHORT TERM GOAL #1   Title Independent with initial HEP    Status Achieved    Target Date 11/19/20      PT SHORT TERM GOAL #2   Title Pt will voice understanding of measures to asssit in the management of knee pain. 11/19/20: Managing knee pain with exercise    Status Achieved    Target Date 11/19/20               PT Long Term Goals - 01/23/21 1242       PT LONG TERM GOAL #1   Title Pt will be Ind in a final HEP to maintain achieved LOF    Status On-going    Target Date 02/15/21      PT LONG TERM GOAL #2   Title Improve L knee and hip strength to 3+to 4-/5 and the R LE to 4- to 4/5 strength for improved functional mobility. 12/10/20: B hip flexion= 3+/5; hip abd 4/5.     Status On-going    Target Date 02/15/21      PT LONG TERM GOAL #3   Title Pt will report ed improve esae of mobility being able to walk 15 mins with need to rest. 01/09/21: Pt reports 20 mins of shopping s the assist of a cart    Baseline able to shop , run errands and ride the bus this past weekend , 20-30 minutes at a time-leaned on cart as needed    Status Achieved    Target Date 01/23/21      PT LONG TERM GOAL #4   Title Pt will report a decrease in knee pain to 3/10 consistently and occuring  intermittently with daily activities. 12/10/20: 2/10 or less cdaily activities. 01/23/21: 0-2/10 c daily activitities    Status Achieved    Target Date 01/23/21      PT LONG TERM GOAL #5   Title Pts 5xSTS s use of hands time will decrease to 11 sec or less as indication of improved function. 01/23/21: 13.3 sec    Baseline 13.1 12/03/20    Status On-going    Target Date 02/15/21      PT LONG TERM GOAL #6   Title Pt's distance will increase to 238ft s need for a rest break as indication of improved functional tolerance. 01/23/21: 432ft c min SOB    Status Achieved    Target Date 01/23/21                   Plan - 01/23/21 1106     Clinical Impression Statement Pt's FOTO was re-assessed and pt's score indicates significant improvement in her perceived functional abilities from 16 to 69%. PT continues to address LE strength and activity tolerance. Pt's distance increased to 425ft. Pt wiil continue with PT 2 more visits, one in Aquatics and one in the cliniic  to continue to address strength, endurance, and function and to provide final HEPs for pt to continue with her rehab at home after DC.    Personal Factors and Comorbidities Time since onset of injury/illness/exacerbation;Past/Current Experience;Comorbidity 3+    Comorbidities morbid obesity, fallen arch, depression, HIV    Examination-Activity Limitations Locomotion Level;Squat;Stairs;Stand    Examination-Participation  Restrictions Shop;Cleaning    Stability/Clinical Decision Making Evolving/Moderate complexity    Clinical Decision Making Moderate    Rehab Potential Fair    PT Frequency 1x / week    PT Duration 4 weeks    PT Treatment/Interventions ADLs/Self Care Home Management;Electrical Stimulation;Iontophoresis 4mg /ml Dexamethasone;Moist Heat;Balance training;Therapeutic exercise;Therapeutic activities;Functional mobility training;Stair training;Gait training;Patient/family education;Manual techniques;Passive range of motion;Taping    PT Next Visit Plan Start Aquatic PT to improve strenth, balance, safety and functional mobility.    PT Home Exercise Plan    Consulted and Agree with Plan of Care Patient             Patient will benefit from skilled therapeutic intervention in order to improve the following deficits and impairments:  Pain, Obesity, Difficulty walking, Decreased endurance, Decreased activity tolerance, Decreased strength, Decreased balance  Visit Diagnosis: Patellofemoral arthralgia of both knees  Decreased range of motion (ROM) of left knee  Muscle weakness (generalized)  Morbid obesity (HCC)  Chronic midline low back pain without sciatica     Problem List Patient Active Problem List   Diagnosis Date Noted   Patellofemoral arthralgia of both knees 03/25/2020   Shoulder injury 01/31/2020   Hemorrhoid 04/05/2019   Healthcare maintenance 04/05/2019   Migraine 09/17/2018   Venous insufficiency 09/16/2018   Abnormal uterine bleeding (AUB) 02/04/2018   Morbid obesity (HCC) 04/19/2017   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 [place], [place], [place] Phone: [phone]   Fax:   [phone]  Name: Meha Vidrine MRN: Galvin Proffer Date of Birth: Nov 17, 1960

## 2021-01-29 ENCOUNTER — Encounter: Payer: Self-pay | Admitting: Physical Therapy

## 2021-01-29 ENCOUNTER — Ambulatory Visit: Payer: Medicare Other | Admitting: Physical Therapy

## 2021-01-29 ENCOUNTER — Other Ambulatory Visit: Payer: Self-pay

## 2021-01-29 DIAGNOSIS — M545 Low back pain, unspecified: Secondary | ICD-10-CM

## 2021-01-29 DIAGNOSIS — M6281 Muscle weakness (generalized): Secondary | ICD-10-CM

## 2021-01-29 DIAGNOSIS — R29810 Facial weakness: Secondary | ICD-10-CM | POA: Diagnosis not present

## 2021-01-29 DIAGNOSIS — G8929 Other chronic pain: Secondary | ICD-10-CM | POA: Diagnosis not present

## 2021-01-29 DIAGNOSIS — M222X1 Patellofemoral disorders, right knee: Secondary | ICD-10-CM | POA: Diagnosis not present

## 2021-01-29 DIAGNOSIS — M25662 Stiffness of left knee, not elsewhere classified: Secondary | ICD-10-CM

## 2021-01-29 DIAGNOSIS — M222X2 Patellofemoral disorders, left knee: Secondary | ICD-10-CM | POA: Diagnosis not present

## 2021-01-29 DIAGNOSIS — M25661 Stiffness of right knee, not elsewhere classified: Secondary | ICD-10-CM

## 2021-01-29 NOTE — Patient Instructions (Addendum)
        Garen Lah, PT, ATRIC Certified Exercise Expert for the Aging Adult  01/29/21 4:54 PM Phone: [phone] Fax: [phone]

## 2021-01-29 NOTE — Therapy (Signed)
[place], [place], [address] Phone: [phone]   Fax:  [phone]  Physical Therapy Treatment  Patient Details  Name: Claudia Brady MRN: [id number] Date of Birth: May 30, 1961 Referring Provider (PT): Oda Kilts, MD   Encounter Date: 01/29/2021   PT End of Session - 01/29/21 1647     Visit Number 11    Number of Visits 13    Date for PT Re-Evaluation 02/15/21    Authorization Type UHC MEDICARE    Authorization Time Period MEDICAID [place] ACCESS    PT Start Time 6962    PT Stop Time 9528    PT Time Calculation (min) 48 min    Activity Tolerance Patient tolerated treatment well    Behavior During Therapy [facility] for tasks assessed/performed             Past Medical History:  Diagnosis Date   ATRIAL FIBRILLATION 08/20/2010   Qualifier: History of  By: Burt Knack CMA, Lori     Chronic pain    Duodenal ulcer Jan. 2012   Upper GI bleeding 2nd to NSAID use   GERD (gastroesophageal reflux disease)    HIV (human immunodeficiency virus infection) ([place]) 2018   Hypertension    Iron deficiency anemia    OSA (obstructive sleep apnea)    Plantar fascia syndrome    RESTLESS LEG SYNDROME 08/20/2010        Rhinitis    Varicose vein of leg     Past Surgical History:  Procedure Laterality Date   FOOT SURGERY     TONSILLECTOMY     TUBAL LIGATION      There were no vitals filed for this visit.   Subjective Assessment - 01/29/21 1544     Subjective Pt reports no pain today and is looking forward to aquatics today to strengthen.  I have gone to the [facility] for water exercise and I love it.  I am so proud of myself how I am moving more now    Currently in Pain? No/denies    Pain Score 0-No pain    Pain Location Knee    Pain Orientation Right;Left    Pain Type Chronic pain    Pain Onset More than a month ago    Pain Frequency Occasional    Pain Score 0    Pain Location Foot                Aquatic therapy at [address] - therapeutic pool temp 89-93 degrees Pt enters building without AD.  Treatment took place in water 3.8 to  4 ft 8 in.feet deep depending upon activity.  Pt entered and exited the pool via stair and handrails independently.     Claudia Brady entered water for aquatic therapy for second time and PT reinforced principles  and therapeutic effects of water as she ambulated and acclimated to pool. Marland Kitchen    She was able to ambulate across pool 15 ft x 4, then side steps x 4 and then ambulates backward x 4    She was able to use aquatic barbells while ambulating to increase abdominal engagement with exercises. At end of session pt was able to jog forward and backwards  with no pain and with more power than first session    Runners stretch  2 x 30 sec Left and then moves into hamstring stretch  Then runners stretch on R  30 sec x 2 and then move into hamstring stretch .  TC to insure proper technique. Figure 4 squat stretch with UE support for R and L x 60 sec each   Pt showed alternate hamstring stretch with back on pool wall and using pool noodle which she was able to relax hamstring more effectively    On edge of pool with bil UE support And PT going over HEP written Pt performed LE exercise Hip abd/add R/L 20 x each and then using 1 UE support Hip ext/flex with knee straight x 20, pt needing VC and TC for correct execution and sequencing  Marching knee/hip 90/90 x 10 and then with added pool noodle for increased resistance x 10 each leg  ham curl R/L x 20.  Squats x 20 reps with intermittent UE support x 2 sets. Lunge to target x 10 on R and the x 10 on L  Triangle pose Cat cow with pool noodle   Ai Chi  for deep breathing and pain control using slow controlled Tai chi like movements with shoulders submerged to shoulder level.( 80% unweighting of joints)  Ai Chi  posture of enclosing folding, soothing, and freeing accepting and accepting with  grace motions x 10 reps . - cues given to keep core tight for improved trunk stabilization; shifting and  Balancing posture 10 reps each with pt holding onto edge of pool with 1 UE  for assist with balance   Bad Ragaz, Pt with lumbar belt around hips and nek doodle for neck support. And added pool noodles for support.  Pt apprehensive and body tense initially .  Pt assisted into supine floating position by lying head on shoulder of PT to get into floating position. . PT at torso and assisting with trunk left to right and vice versa to engage trunk muscles. PT then rotated trunk in order to engage abdomnal (internal and external obliques)  Emphasis on breathing techniques to draw in abdominals for support.   LAD to R and then L LE with success this week and decrease body muscle tension       Pt requires the buoyancy of water for active assisted exercises with buoyancy supported for strengthening and AROM exercises: PT  requires the viscosity of the water for resistance with strengthening exercises Hydrostatic pressure also supports joints by unweighting joint load by at least 50 % in 3-4 feet depth water. 80% in chest to neck deep water. Water will allow for  reduced joint loading through buoyancy to help patient improve posture without excess stress and pain.                        PT Education - 01/29/21 1646     Education Details Pt educated with HEP written and given and reviewed today for RX session    Person(s) Educated Patient    Methods Explanation;Demonstration;Tactile cues;Verbal cues;Handout    Comprehension Verbalized understanding;Returned demonstration              PT Short Term Goals - 11/22/20 0721       PT SHORT TERM GOAL #1   Title Independent with initial HEP    Status Achieved    Target Date 11/19/20      PT SHORT TERM GOAL #2   Title Pt will voice understanding of measures to asssit in the management of knee pain. 11/19/20: Managing knee pain  with exercise    Status Achieved    Target Date 11/19/20  PT Long Term Goals - 01/23/21 1242       PT LONG TERM GOAL #1   Title Pt will be Ind in a final HEP to maintain achieved LOF    Status On-going    Target Date 02/15/21      PT LONG TERM GOAL #2   Title Improve L knee and hip strength to 3+to 4-/5 and the R LE to 4- to 4/5 strength for improved functional mobility. 12/10/20: B hip flexion= 3+/5; hip abd 4/5.    Status On-going    Target Date 02/15/21      PT LONG TERM GOAL #3   Title Pt will report ed improve esae of mobility being able to walk 15 mins with need to rest. 01/09/21: Pt reports 20 mins of shopping s the assist of a cart    Baseline able to shop , run errands and ride the bus this past weekend , 20-30 minutes at a time-leaned on cart as needed    Status Achieved    Target Date 01/23/21      PT LONG TERM GOAL #4   Title Pt will report a decrease in knee pain to 3/10 consistently and occuring  intermittently with daily activities. 12/10/20: 2/10 or less cdaily activities. 01/23/21: 0-2/10 c daily activitities    Status Achieved    Target Date 01/23/21      PT LONG TERM GOAL #5   Title Pts 5xSTS s use of hands time will decrease to 11 sec or less as indication of improved function. 01/23/21: 13.3 sec    Baseline 13.1 12/03/20    Status On-going    Target Date 02/15/21      PT LONG TERM GOAL #6   Title Pt's 2MWT distance will increase to 287f s need for a rest break as indication of improved functional tolerance. 01/23/21: 4160fc min SOB    Status Achieved    Target Date 01/23/21           HEP  Access Code: [email or web address]: [email or web address]: 07/20/2022Prepared by: LaThornton DalesGastroc Stretch with [place] 1 x daily - 2 x weekly - 1 reps - 30 seconds hold  Forward Walking - 1 x daily - 2 x weekly - 4 sets  Backward Walking - 1 x daily - 2 x weekly - 4 sets  Forward March - 1 x daily - 2 x weekly - 4  sets  Backward March - 1 x daily - 2 x weekly - 4 sets  Forward March with Opposite Arm Knee Taps and Hand Floats - 1 x daily - 2 x weekly - 4 sets  Bilateral Shoulder Horizontal Abduction Adduction AROM - 1 x daily - 2 x weekly - 4 sets  Standing March [facility] 1 x daily - 2 x weekly - 15 reps  Standing Hip Flexion Extension at [facility] 1 x daily - 2 x weekly - 15 reps  Standing Hip Abduction Adduction at [facility] 1 x daily - 2 x weekly - 15 reps  Squat - 1 x daily - 2 x weekly - 15 reps  Single Leg Squat - 1 x daily - 2 x weekly - 15 reps  Full Triangle Pose in ShXcel Energyith Pool Noodle - 1 x daily - 7 x weekly - 3 sets - 10 reps  Cat Cow in Shallow Water with Pool Noodle - 1 x daily - 7 x weekly - 3 sets -  10 reps      Plan - 01/29/21 1649     Clinical Impression Statement Pt entered aquatic pool with no pain in knees or foot and reported going to [facility] for water exercise on her own in the past week.   She states she is proud of herself and is trying to move more and count her steps  and try to move more for health.  She states she is able to move better after she has attended PT clinic and aquatics.  Pt was educated on  HEP for home use at community pool.  Pt reviewed HEP and PT answered question and demo and used VC and TC for to correct technique.   Pt requires the buoyancy of water for active assisted exercises with buoyancy supported for strengthening and AROM exercises: PT  requires the viscosity of the water for resistance with strengthening exercises  Hydrostatic pressure also supports joints by unweighting joint load by at least 50 % in 3-4 feet depth water. 80% in chest to neck deep water.    Personal Factors and Comorbidities Time since onset of injury/illness/exacerbation;Past/Current Experience;Comorbidity 3+    Comorbidities morbid obesity, fallen arch, depression, HIV    Examination-Activity Limitations Locomotion Level;Squat;Stairs;Stand    PT Frequency 1x /  week    PT Duration 4 weeks    PT Treatment/Interventions ADLs/Self Care Home Management;Electrical Stimulation;Iontophoresis 76m/ml Dexamethasone;Moist Heat;Balance training;Therapeutic exercise;Therapeutic activities;Functional mobility training;Stair training;Gait training;Patient/family education;Manual techniques;Passive range of motion;Taping    PT Next Visit Plan Start Aquatic PT to improve strenth, balance, safety and functional mobility.    PT Home Exercise Plan [email or web address]   [email or web address]    Consulted and Agree with Plan of Care Patient             Patient will benefit from skilled therapeutic intervention in order to improve the following deficits and impairments:  Pain, Obesity, Difficulty walking, Decreased endurance, Decreased activity tolerance, Decreased strength, Decreased balance  Visit Diagnosis: Patellofemoral arthralgia of both knees  Decreased range of motion (ROM) of left knee  Muscle weakness (generalized)  Morbid obesity (HCC)  Chronic midline low back pain without sciatica  Decreased range of motion (ROM) of right knee     Problem List Patient Active Problem List   Diagnosis Date Noted   Patellofemoral arthralgia of both knees 03/25/2020   Shoulder injury 01/31/2020   Hemorrhoid 04/05/2019   Healthcare maintenance 04/05/2019   Migraine 09/17/2018   Venous insufficiency 09/16/2018   Abnormal uterine bleeding (AUB) 02/04/2018   Morbid obesity ([place] 04/19/2017   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

## 2021-02-05 ENCOUNTER — Ambulatory Visit: Payer: Medicare Other

## 2021-02-05 ENCOUNTER — Other Ambulatory Visit: Payer: Self-pay

## 2021-02-05 ENCOUNTER — Ambulatory Visit: Payer: Medicare Other | Admitting: Physical Therapy

## 2021-02-05 DIAGNOSIS — R29810 Facial weakness: Secondary | ICD-10-CM

## 2021-02-05 DIAGNOSIS — M25662 Stiffness of left knee, not elsewhere classified: Secondary | ICD-10-CM | POA: Diagnosis not present

## 2021-02-05 DIAGNOSIS — M25661 Stiffness of right knee, not elsewhere classified: Secondary | ICD-10-CM

## 2021-02-05 DIAGNOSIS — M222X1 Patellofemoral disorders, right knee: Secondary | ICD-10-CM | POA: Diagnosis not present

## 2021-02-05 DIAGNOSIS — M6281 Muscle weakness (generalized): Secondary | ICD-10-CM | POA: Diagnosis not present

## 2021-02-05 DIAGNOSIS — M545 Low back pain, unspecified: Secondary | ICD-10-CM | POA: Diagnosis not present

## 2021-02-05 DIAGNOSIS — G8929 Other chronic pain: Secondary | ICD-10-CM | POA: Diagnosis not present

## 2021-02-05 DIAGNOSIS — M222X2 Patellofemoral disorders, left knee: Secondary | ICD-10-CM | POA: Diagnosis not present

## 2021-02-05 NOTE — Therapy (Signed)
[name], [place], [address] Phone: [phone]   Fax:  [phone]  Physical Therapy Treatment/Discharge  Patient Details  Name: Claudia Brady MRN: [id number] Date of Birth: 09/30/1960 Referring Provider (PT): Oda Kilts, MD   Encounter Date: 02/05/2021   PT End of Session - 02/05/21 1524     Visit Number 12    Number of Visits 13    Date for PT Re-Evaluation 02/15/21    Authorization Type UHC MEDICARE    Authorization Time Period MEDICAID [place] ACCESS    PT Start Time 4431    PT Stop Time 5400    PT Time Calculation (min) 43 min    Activity Tolerance Patient tolerated treatment well    Behavior During Therapy [facility] for tasks assessed/performed             Past Medical History:  Diagnosis Date   ATRIAL FIBRILLATION 08/20/2010   Qualifier: History of  By: Burt Knack CMA, Lori     Chronic pain    Duodenal ulcer Jan. 2012   Upper GI bleeding 2nd to NSAID use   GERD (gastroesophageal reflux disease)    HIV (human immunodeficiency virus infection) ([place]) 2018   Hypertension    Iron deficiency anemia    OSA (obstructive sleep apnea)    Plantar fascia syndrome    RESTLESS LEG SYNDROME 08/20/2010        Rhinitis    Varicose vein of leg     Past Surgical History:  Procedure Laterality Date   FOOT SURGERY     TONSILLECTOMY     TUBAL LIGATION      There were no vitals filed for this visit.   Subjective Assessment - 02/05/21 1432     Subjective Pt reports she has continued to do well with less pain and being more active. Pt has a HEP for aquatics and has gone to the [facility] on her own. Pt reports she is walking more. She overdo it one day but recovered.                [facility] PT Assessment - 02/05/21 0001       Transfers   Five time sit to stand comments  12.4                           [facility] Adult PT Treatment/Exercise - 02/05/21 0001       Lumbar Exercises:  Aerobic   Nustep 10 mins, L7, UE/LE      Knee/Hip Exercises: Seated   Sit to Sand 10 reps      Knee/Hip Exercises: Supine   Bridges 10 reps    Straight Leg Raises Right;Left;10 reps      Knee/Hip Exercises: Sidelying   Hip ABduction Right;Left;10 reps    Clams L and R, 10x                    PT Education - 02/05/21 1523     Education Details Final HEP    Person(s) Educated Patient    Methods Explanation;Demonstration;Tactile cues;Verbal cues;Handout    Comprehension Verbalized understanding;Returned demonstration;Verbal cues required;Tactile cues required              PT Short Term Goals - 11/22/20 0721       PT SHORT TERM GOAL #1   Title Independent with initial HEP    Status Achieved    Target Date 11/19/20  PT SHORT TERM GOAL #2   Title Pt will voice understanding of measures to asssit in the management of knee pain. 11/19/20: Managing knee pain with exercise    Status Achieved    Target Date 11/19/20               PT Long Term Goals - 02/05/21 1525       PT LONG TERM GOAL #1   Title Pt will be Ind in a final HEP to maintain achieved LOF    Status Achieved    Target Date 02/05/21      PT LONG TERM GOAL #2   Title Improve L knee and hip strength to 3+to 4-/5 and the R LE to 4- to 4/5 strength for improved functional mobility. 12/10/20: B hip flexion= 3+/5; hip abd 4/5.02/05/21- B hips 4-$4+/5, B knees 5/5    Status Achieved    Target Date 02/05/21      PT LONG TERM GOAL #3   Title Pt will report ed improve esae of mobility being able to walk 15 mins with need to rest. 01/09/21: Pt reports 20 mins of shopping s the assist of a cart 02/04/21: Pt reports tolerated standing and walking for approx 1 hour c short rest breaks as needed.    Status Achieved    Target Date 02/05/21      PT LONG TERM GOAL #4   Title Pt will report a decrease in knee pain to 3/10 consistently and occuring  intermittently with daily activities. 12/10/20: 2/10 or less  cdaily activities. 01/23/21: 0-2/10 c daily activitities 7/26/222- pt reports 0/10 knee pain with activity    Status Achieved    Target Date 02/05/21      PT LONG TERM GOAL #5   Title Pts 5xSTS s use of hands time will decrease to 11 sec or less as indication of improved function. 01/23/21: 12.2 sec    Baseline 13.1 12/03/20    Status Achieved    Target Date 02/05/21      PT LONG TERM GOAL #6   Title Pt's 2MWT distance will increase to 254f s need for a rest break as indication of improved functional tolerance. 01/23/21: 4166fc min SOB    Baseline 395 ft without rest    Status Achieved    Target Date 01/23/21                   Plan - 02/05/21 1524     Clinical Impression Statement Pt has made excellent progress in PT both with traditional intervention and aquatics re: strength and functional abilities. All PT goals have been met. Pt is Ind c a HEP to maintain or improve her LOF. Pt is in ageement with DC at this time.    Personal Factors and Comorbidities Time since onset of injury/illness/exacerbation;Past/Current Experience;Comorbidity 3+    Comorbidities morbid obesity, fallen arch, depression, HIV    Examination-Activity Limitations Locomotion Level;Squat;Stairs;Stand    Examination-Participation Restrictions Shop;Cleaning    Stability/Clinical Decision Making Evolving/Moderate complexity    Clinical Decision Making Moderate    Rehab Potential Fair    PT Frequency 1x / week    PT Duration 4 weeks    PT Treatment/Interventions ADLs/Self Care Home Management;Electrical Stimulation;Iontophoresis 37m63ml Dexamethasone;Moist Heat;Balance training;Therapeutic exercise;Therapeutic activities;Functional mobility training;Stair training;Gait training;Patient/family education;Manual techniques;Passive range of motion;Taping    PT Home Exercise Plan [email or web address]    Consulted and Agree with Plan of Care Patient  Patient will benefit from skilled  therapeutic intervention in order to improve the following deficits and impairments:  Pain, Obesity, Difficulty walking, Decreased endurance, Decreased activity tolerance, Decreased strength, Decreased balance  Visit Diagnosis: Patellofemoral arthralgia of both knees  Decreased range of motion (ROM) of left knee  Muscle weakness (generalized)  Morbid obesity (HCC)  Chronic midline low back pain without sciatica  Decreased range of motion (ROM) of right knee  Facial weakness     Problem List Patient Active Problem List   Diagnosis Date Noted   Patellofemoral arthralgia of both knees 03/25/2020   Shoulder injury 01/31/2020   Hemorrhoid 04/05/2019   Healthcare maintenance 04/05/2019   Migraine 09/17/2018   Venous insufficiency 09/16/2018   Abnormal uterine bleeding (AUB) 02/04/2018   Morbid obesity ([place]) 04/19/2017   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 Patient goals were met. Patient is being discharged due to being pleased with the current functional level.   Gar Ponto MS, PT 02/05/21 3:42 PM   [facility] [address] [place], [place], [address] Phone: [phone]   Fax:  [phone]  Name: Irini Leet MRN: [id number] Date of Birth: [birth year]

## 2021-02-14 ENCOUNTER — Inpatient Hospital Stay: Admission: RE | Admit: 2021-02-14 | Payer: Medicare Other | Source: Ambulatory Visit

## 2021-02-24 DIAGNOSIS — G4733 Obstructive sleep apnea (adult) (pediatric): Secondary | ICD-10-CM | POA: Diagnosis not present

## 2021-02-25 DIAGNOSIS — G4733 Obstructive sleep apnea (adult) (pediatric): Secondary | ICD-10-CM | POA: Diagnosis not present

## 2021-02-27 IMAGING — DX DG CHEST 2V
2 series · 2 of 2 positions shown · non-contrast
Comparison: 09/16/2018

CLINICAL DATA: Chest tightness for 1 week

EXAM:
CHEST - 2 VIEW

[chest pa]
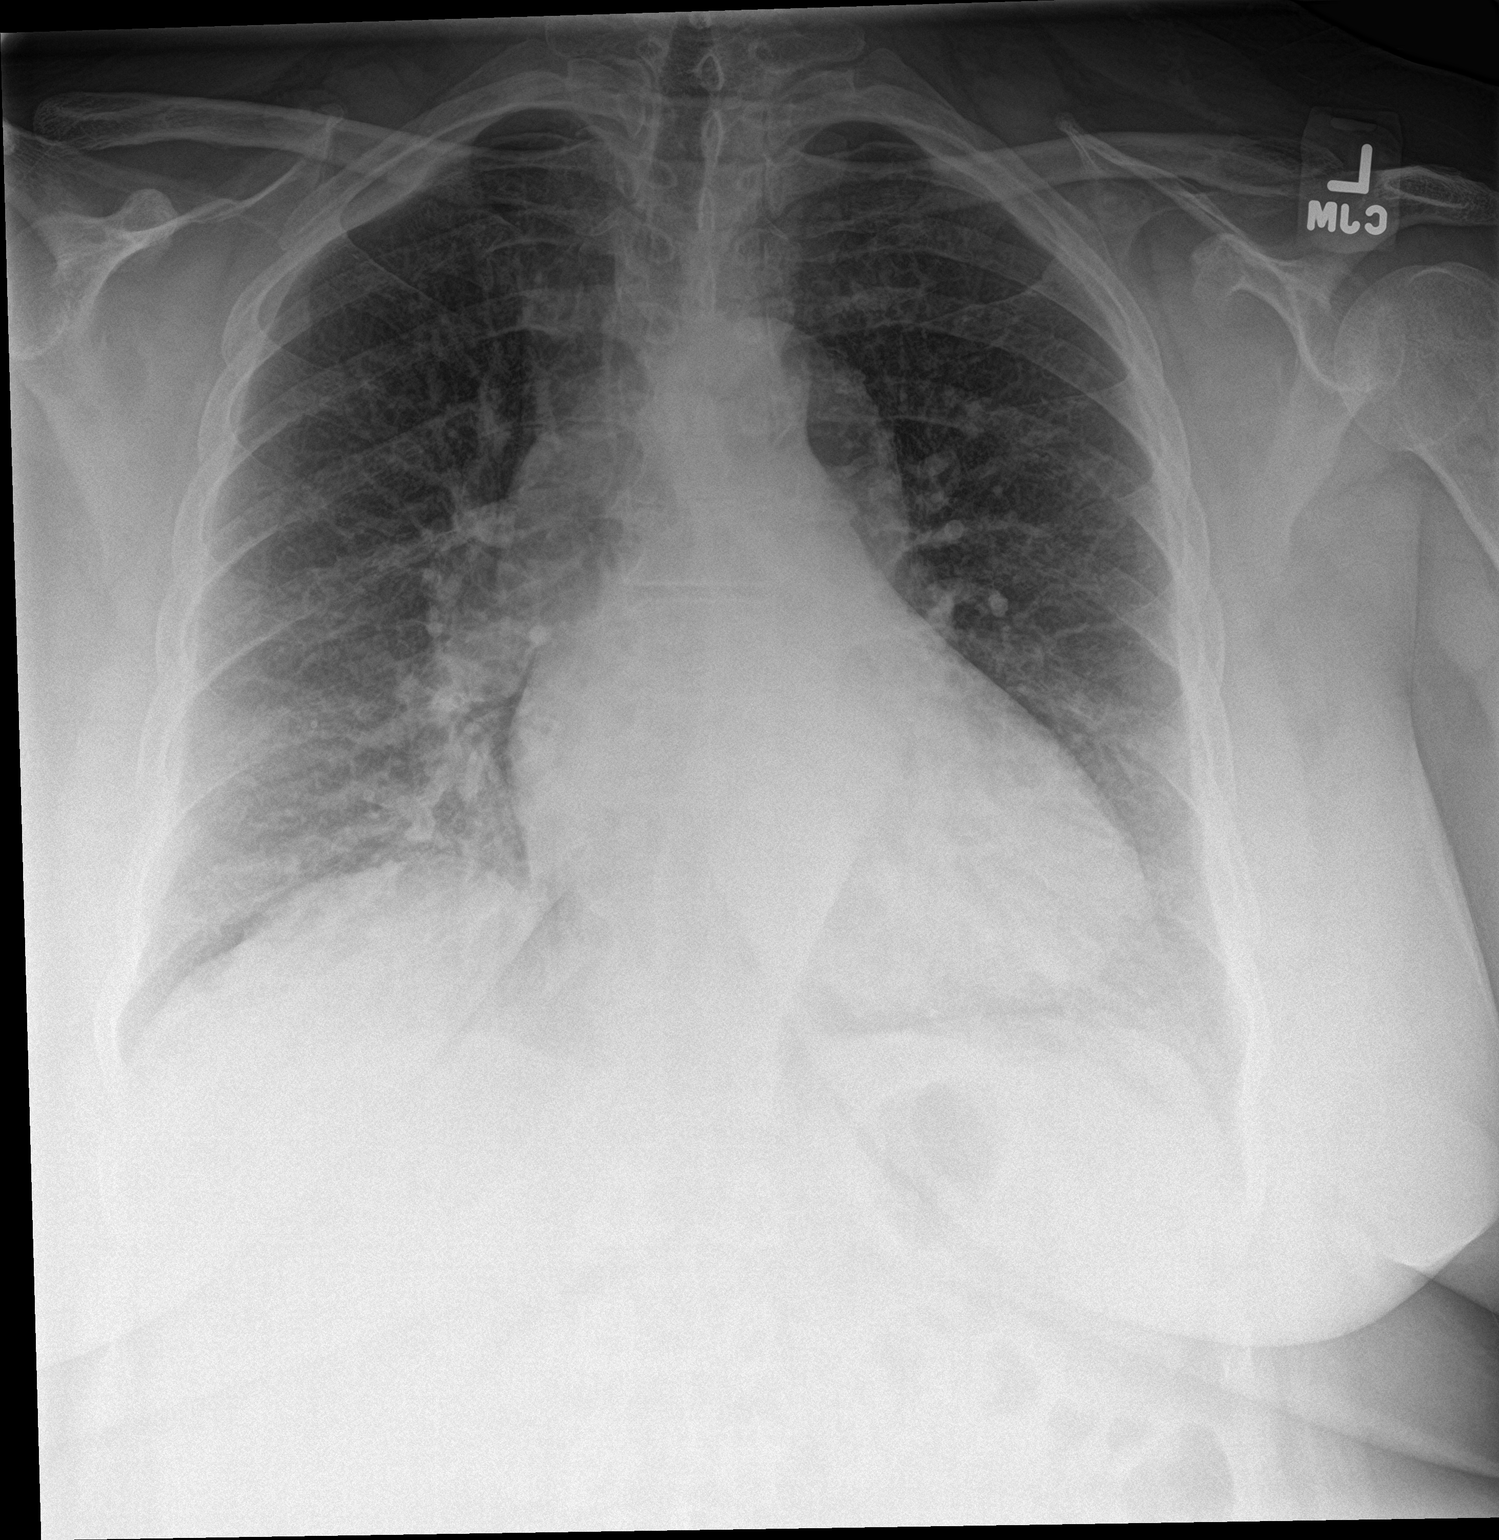

[chest lat]
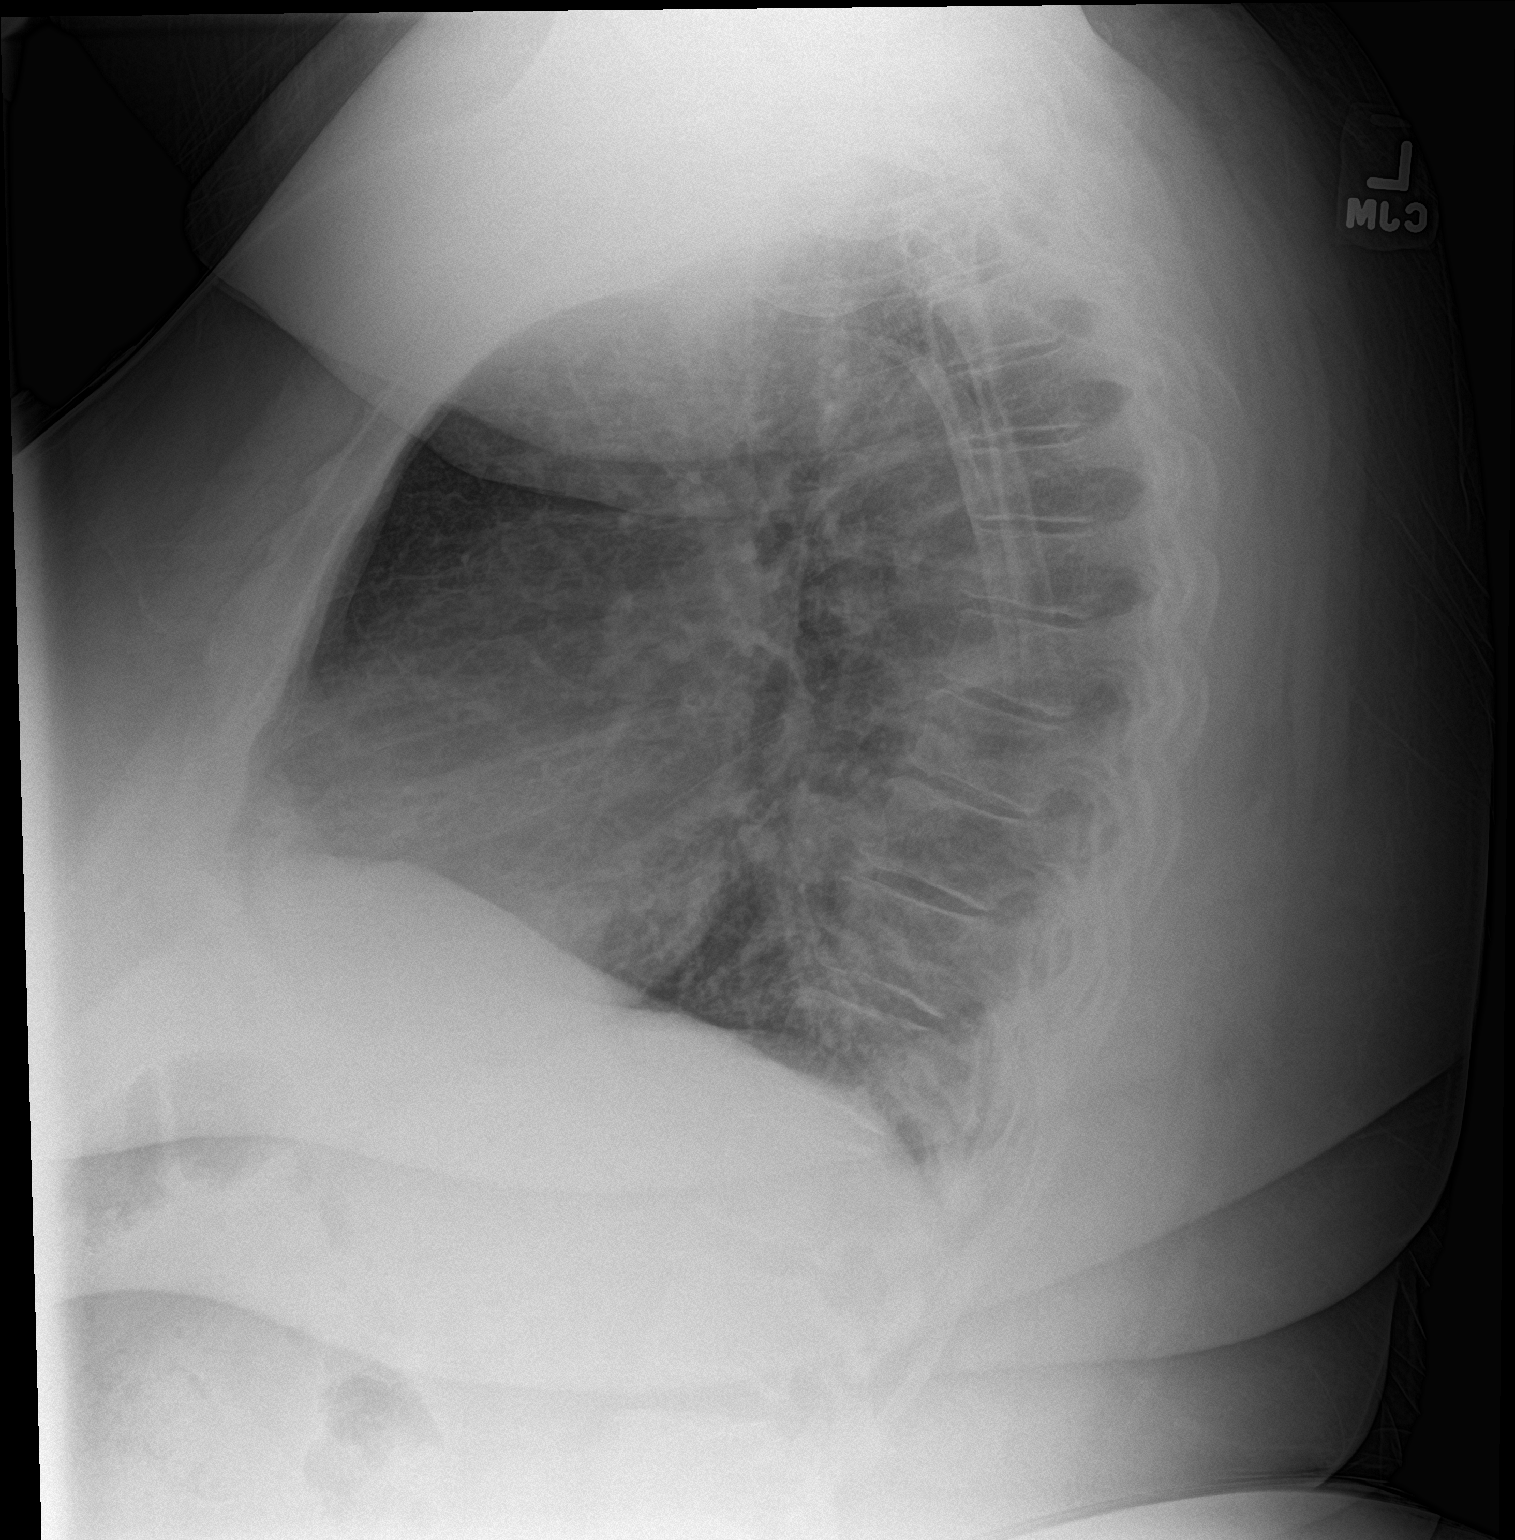

[2 of 2 positions shown; findings below may reference images not displayed]

FINDINGS: Frontal and lateral views of the chest demonstrate a stable cardiac
silhouette. There is chronic central vascular congestion and
interstitial prominence, without airspace disease, effusion, or
pneumothorax. No acute bony abnormalities.
IMPRESSION: 1. Stable chest, no acute process.

## 2021-03-20 ENCOUNTER — Encounter: Payer: Self-pay | Admitting: Internal Medicine

## 2021-03-20 ENCOUNTER — Other Ambulatory Visit: Payer: Self-pay

## 2021-03-20 ENCOUNTER — Ambulatory Visit (INDEPENDENT_AMBULATORY_CARE_PROVIDER_SITE_OTHER): Payer: Medicare Other | Admitting: Internal Medicine

## 2021-03-20 VITALS — BP 137/80 | HR 75 | Temp 98.1°F | Ht 65.4 in | Wt 375.4 lb

## 2021-03-20 DIAGNOSIS — E875 Hyperkalemia: Secondary | ICD-10-CM | POA: Diagnosis not present

## 2021-03-20 DIAGNOSIS — K047 Periapical abscess without sinus: Secondary | ICD-10-CM | POA: Diagnosis not present

## 2021-03-20 DIAGNOSIS — Z0000 Encounter for general adult medical examination without abnormal findings: Secondary | ICD-10-CM | POA: Diagnosis not present

## 2021-03-20 DIAGNOSIS — K921 Melena: Secondary | ICD-10-CM

## 2021-03-20 DIAGNOSIS — B20 Human immunodeficiency virus [HIV] disease: Secondary | ICD-10-CM

## 2021-03-20 DIAGNOSIS — K089 Disorder of teeth and supporting structures, unspecified: Secondary | ICD-10-CM

## 2021-03-20 DIAGNOSIS — I10 Essential (primary) hypertension: Secondary | ICD-10-CM | POA: Diagnosis not present

## 2021-03-20 DIAGNOSIS — K649 Unspecified hemorrhoids: Secondary | ICD-10-CM

## 2021-03-20 DIAGNOSIS — G4733 Obstructive sleep apnea (adult) (pediatric): Secondary | ICD-10-CM | POA: Diagnosis not present

## 2021-03-20 MED ORDER — CLINDAMYCIN HCL 150 MG PO CAPS: 150.0000 mg | ORAL_CAPSULE | Freq: Four times a day (QID) | ORAL | 0 refills | Status: AC

## 2021-03-20 NOTE — Patient Instructions (Addendum)
Claudia Brady, it was a pleasure seeing you today!  Today we discussed: You stated you were doing well today. You complained of tooth pain and you have an appointment coming up on 03/27/21. We will send some antibiotics, Clindamycin for 7 days, to the pharmacy. Please take as indicated. Your blood pressure was elevated slightly, please bring a log to the next visit. You had two episodes of bleeding per rectum, but have not had an episode over a month. It appears they are due to hemorrhoids. We will check a blood count to see how your levels are. Please try to obtain records from your old colonoscopy so we can plan accordingly. You denied mammogram and pap smear, and flu shot at the moment.   I have ordered the following labs today:  Lab Orders         CBC with Diff         BMP8+Anion Gap      Referrals ordered today:   Referral Orders  No referral(s) requested today     I have ordered the following medication/changed the following medications:   Stop the following medications: There are no discontinued medications.   Start the following medications: Meds ordered this encounter  Medications   clindamycin (CLEOCIN) 150 MG capsule    Sig: Take 1 capsule (150 mg total) by mouth 4 (four) times daily for 7 days.    Dispense:  28 capsule    Refill:  0     Follow-up: 3 months   Please make sure to arrive 15 minutes prior to your next appointment. If you arrive late, you may be asked to reschedule.   We look forward to seeing you next time. Please call our clinic at [phone] if you have any questions or concerns. The best time to call is Monday-Friday from 9am-4pm, but there is someone available 24/7. If after hours or the weekend, call the main hospital number and ask for the Internal Medicine Resident On-Call. If you need medication refills, please notify your pharmacy one week in advance and they will send [place] a request.  Thank you for letting us take part in your care.  Wishing you the best!  Thank you, Gwenevere Abbot, MD

## 2021-03-21 LAB — CBC WITH DIFFERENTIAL/PLATELET
Basophils Absolute: 0 10*3/uL (ref 0.0–0.2)
Basos: 1 %
EOS (ABSOLUTE): 0.2 10*3/uL (ref 0.0–0.4)
Eos: 4 %
Hematocrit: 37.5 % (ref 34.0–46.6)
Hemoglobin: 12.3 g/dL (ref 11.1–15.9)
Immature Grans (Abs): 0 10*3/uL (ref 0.0–0.1)
Immature Granulocytes: 0 %
Lymphocytes Absolute: 2 10*3/uL (ref 0.7–3.1)
Lymphs: 31 %
MCH: 27.8 pg (ref 26.6–33.0)
MCHC: 32.8 g/dL (ref 31.5–35.7)
MCV: 85 fL (ref 79–97)
Monocytes Absolute: 0.5 10*3/uL (ref 0.1–0.9)
Monocytes: 8 %
Neutrophils Absolute: 3.6 10*3/uL (ref 1.4–7.0)
Neutrophils: 56 %
Platelets: 306 10*3/uL (ref 150–450)
RBC: 4.42 x10E6/uL (ref 3.77–5.28)
RDW: 16.2 % — ABNORMAL HIGH (ref 11.7–15.4)
WBC: 6.4 10*3/uL (ref 3.4–10.8)

## 2021-03-21 LAB — BMP8+ANION GAP
Anion Gap: 13 mmol/L (ref 10.0–18.0)
BUN/Creatinine Ratio: 15 (ref 12–28)
BUN: 11 mg/dL (ref 8–27)
CO2: 27 mmol/L (ref 20–29)
Calcium: 9.5 mg/dL (ref 8.7–10.3)
Chloride: 99 mmol/L (ref 96–106)
Creatinine, Ser: 0.74 mg/dL (ref 0.57–1.00)
Glucose: 90 mg/dL (ref 65–99)
Potassium: 5 mmol/L (ref 3.5–5.2)
Sodium: 139 mmol/L (ref 134–144)
eGFR: 93 mL/min/{1.73_m2} (ref >59)

## 2021-03-23 NOTE — Progress Notes (Signed)
   CC: follow up and tooth pain  HPI:  Ms.Jaselynn Lacek is a 60 y.o. with medical history as below presenting to [facility] for tooth pain and follow up.   Please see problem-based list for further details, assessments, and plans.  Past Medical History:  Diagnosis Date  . Abnormal uterine bleeding (AUB) 02/04/2018  . ATRIAL FIBRILLATION 08/20/2010   Qualifier: History of  By: Excell Seltzer CMA, Lawson Fiscal    . Chronic pain   . Depression 11/28/2010  . Duodenal ulcer Jan. 2012   Upper GI bleeding 2nd to NSAID use  . 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 GERD (gastroesophageal reflux disease)   . HIV (human immunodeficiency virus infection) (HCC) 2018  . Hypertension   . Iron deficiency anemia   . Migraine 09/17/2018  . OSA (obstructive sleep apnea)   . Patellofemoral arthralgia of both knees 03/25/2020  . Plantar fascia syndrome   . RESTLESS LEG SYNDROME 08/20/2010       . Rhinitis   . Shoulder injury 01/31/2020  . Varicose vein of leg    Review of Systems:  Review of system negative unless stated in the problem list or HPI.     Physical Exam:  Vitals:   03/20/21 1519  BP: 137/80  Pulse: 75  Temp: 98.1 F (36.7 C)  TempSrc: Oral  SpO2: 94%  Weight: (!) 375 lb 6.4 oz (170.3 kg)  Height: 5' 5.4" (1.661 m)    Physical Exam Constitutional:      Appearance: Normal appearance.  HENT:     Head: Normocephalic and atraumatic.     Right Ear: External ear normal.     Left Ear: External ear normal.     Nose: Nose normal. No rhinorrhea.     Mouth/Throat:     Mouth: Mucous membranes are moist. No injury, lacerations or oral lesions.     Dentition: Dental tenderness present. No gum lesions.     Pharynx: Oropharynx is clear.     Comments: Right maxillary tenderness Eyes:     Extraocular Movements: Extraocular movements intact.     Conjunctiva/sclera: Conjunctivae normal.     Pupils: Pupils are equal, round, and reactive to light.  Cardiovascular:     Rate and Rhythm: Normal rate and regular rhythm.      Pulses: Normal pulses.     Heart sounds: Normal heart sounds.  Pulmonary:     Effort: Pulmonary effort is normal.     Breath sounds: Normal breath sounds.  Abdominal:     General: Bowel sounds are normal. There is no distension.     Palpations: Abdomen is soft.     Tenderness: There is no abdominal tenderness.  Musculoskeletal:        General: No swelling or tenderness. Normal range of motion.     Cervical back: Normal range of motion and neck supple.  Skin:    General: Skin is warm and dry.     Capillary Refill: Capillary refill takes less than 2 seconds.     Coloration: Skin is not pale.  Neurological:     General: No focal deficit present.     Mental Status: She is alert and oriented to person, place, and time.  Psychiatric:        Mood and Affect: Mood normal.        Behavior: Behavior normal.    Assessment & Plan:   See Encounters Tab for problem based charting.  Patient seen with Dr. Sheran Lawless, MD

## 2021-03-23 NOTE — Assessment & Plan Note (Signed)
Patient appeared to be very sensitive about body habitus and stated she does not want any pap smear, or mammogram at this time due to anyone touching her. Denied flu vaccine. Asked the patient to obtain a copy of her colonoscopy and submit to our clinic so we can review it. She replied with understanding.

## 2021-03-24 ENCOUNTER — Encounter: Payer: Self-pay | Admitting: Internal Medicine

## 2021-03-24 NOTE — Assessment & Plan Note (Signed)
Assessment: Patient has HIV and follows with ID. She is on Delstrigo and states she is complaint with the therapy. Her CD4 count was 810 in 08/2020 and viral load was less than 20. Recommended continuing her antiretroviral and continued follow up with ID.   Plan: -Continue Delstrigo 100-300-300 mg daily -Follow with Infectious Disease

## 2021-03-24 NOTE — Assessment & Plan Note (Signed)
Assessment: Patient has hypertension and is on labetalol 100 mg and losartan 100 mg. Her blood pressure was slightly elevated at 137/80 so we will continue the current medicines with close monitoring. Advise patient to check blood pressure at home.  Plan: -Continue current medicines -Check bp at home and write down readings.

## 2021-03-24 NOTE — Assessment & Plan Note (Signed)
Assessment: Patient presented with tooth pain and states she is overdue for seeing a dentist. COVID prevented her from seeing the dentist in 2020 and 2021 and she missed her appointment last month due to cold like illness but she has an appointment coming up. She has tooth pain with some swelling and is concerned that might delay her treatment by the dentist. I believe it is appropriate to treat with a course of antibiotics to prevent an abscess from developing. Her plan is to get all teeth removed and obtain dentures.   Plan: -Clindamycin 150 mg QID -Follow with dentist

## 2021-03-24 NOTE — Assessment & Plan Note (Signed)
Assessment: Patient states she had two episodes of BRB on toilet paper. She stated this was after having some constipation and left sided abdominal pain. She denied any pain with defecation. When asked patient, if we could perform a rectal exam, she declined stating she does not want that. Asked her to use Miralax and Senna for bowel movements. Instructed her to use one and if she has to strain then add the other to her regimen.  Plan: -Miralax and Senna S for bowel movement as needed -Instructed patient to obtain colonoscopy report for review; if unable then we will repeat colonoscopy.

## 2021-03-24 NOTE — Assessment & Plan Note (Signed)
Assessment: Patient endorsed sporadic use of her CPAP at night. She states this is due to phobias she has regarding contamination but she is trying to use it more often as she believes it is beneficial. I advised her to use it regularly as it can prevent cardiopulmonary decline.   Plan: -Monitor and encourage CPAP use.

## 2021-03-28 DIAGNOSIS — G4733 Obstructive sleep apnea (adult) (pediatric): Secondary | ICD-10-CM | POA: Diagnosis not present

## 2021-04-09 NOTE — Progress Notes (Signed)
Internal Medicine Clinic Attending  I saw and evaluated the patient.  I personally confirmed the key portions of the history and exam documented by Dr. Welton Flakes and I reviewed pertinent patient test results.  Extensive dental decay and erosion of teeth apparent as well as gingival inflammation. The gumline is tender, though no localized swelling or area suspicious for abscess.  Antibiotic administered to help reduce any infection-related pain, as it will probably be some time before she can schedule with a dentist.  Info regarding local dental resources was provided.  Episodic scant amount hematochezia is felt to be most c/w hemorrhoids.  The assessment, diagnosis, and plan were formulated together and I agree with the documentation in the resident's note.

## 2021-04-27 DIAGNOSIS — G4733 Obstructive sleep apnea (adult) (pediatric): Secondary | ICD-10-CM | POA: Diagnosis not present

## 2021-05-28 DIAGNOSIS — G4733 Obstructive sleep apnea (adult) (pediatric): Secondary | ICD-10-CM | POA: Diagnosis not present

## 2021-06-13 ENCOUNTER — Telehealth: Payer: Self-pay

## 2021-06-13 ENCOUNTER — Other Ambulatory Visit: Payer: Self-pay | Admitting: Internal Medicine

## 2021-06-13 DIAGNOSIS — B20 Human immunodeficiency virus [HIV] disease: Secondary | ICD-10-CM

## 2021-06-13 NOTE — Telephone Encounter (Signed)
Called patient to get some appointments scheduled, left patient a voicemail to call us back to get scheduled.

## 2021-06-27 DIAGNOSIS — G4733 Obstructive sleep apnea (adult) (pediatric): Secondary | ICD-10-CM | POA: Diagnosis not present

## 2021-07-03 ENCOUNTER — Other Ambulatory Visit: Payer: Self-pay

## 2021-07-03 DIAGNOSIS — Z113 Encounter for screening for infections with a predominantly sexual mode of transmission: Secondary | ICD-10-CM

## 2021-07-03 DIAGNOSIS — B20 Human immunodeficiency virus [HIV] disease: Secondary | ICD-10-CM

## 2021-07-10 ENCOUNTER — Other Ambulatory Visit: Payer: Medicare Other

## 2021-07-16 ENCOUNTER — Other Ambulatory Visit: Payer: Medicaid Other

## 2021-07-16 ENCOUNTER — Other Ambulatory Visit (HOSPITAL_COMMUNITY)
Admission: RE | Admit: 2021-07-16 | Discharge: 2021-07-16 | Disposition: A | Discharge status: Home / Self Care | Payer: Commercial Managed Care - HMO | Source: Ambulatory Visit | Attending: Internal Medicine | Admitting: Internal Medicine

## 2021-07-16 ENCOUNTER — Other Ambulatory Visit: Payer: Self-pay

## 2021-07-16 DIAGNOSIS — B20 Human immunodeficiency virus [HIV] disease: Secondary | ICD-10-CM

## 2021-07-16 DIAGNOSIS — Z113 Encounter for screening for infections with a predominantly sexual mode of transmission: Secondary | ICD-10-CM

## 2021-07-17 LAB — URINE CYTOLOGY ANCILLARY ONLY
Chlamydia: NEGATIVE
Comment: NEGATIVE
Comment: NORMAL
Neisseria Gonorrhea: NEGATIVE

## 2021-07-17 LAB — T-HELPER CELL (CD4) - (RCID CLINIC ONLY)
CD4 % Helper T Cell: 43 % (ref 33–65)
CD4 T Cell Abs: 860 /uL (ref 400–1790)

## 2021-07-18 LAB — CBC WITH DIFFERENTIAL/PLATELET
Absolute Monocytes: 510 cells/uL (ref 200–950)
Basophils Absolute: 32 cells/uL (ref 0–200)
Basophils Relative: 0.5 %
Eosinophils Absolute: 164 cells/uL (ref 15–500)
Eosinophils Relative: 2.6 %
HCT: 38.4 % (ref 35.0–45.0)
Hemoglobin: 12.3 g/dL (ref 11.7–15.5)
Lymphs Abs: 2060 cells/uL (ref 850–3900)
MCH: 28.9 pg (ref 27.0–33.0)
MCHC: 32 g/dL (ref 32.0–36.0)
MCV: 90.1 fL (ref 80.0–100.0)
MPV: 10.6 fL (ref 7.5–12.5)
Monocytes Relative: 8.1 %
Neutro Abs: 3534 cells/uL (ref 1500–7800)
Neutrophils Relative %: 56.1 %
Platelets: 280 10*3/uL (ref 140–400)
RBC: 4.26 10*6/uL (ref 3.80–5.10)
RDW: 14.8 % (ref 11.0–15.0)
Total Lymphocyte: 32.7 %
WBC: 6.3 10*3/uL (ref 3.8–10.8)

## 2021-07-18 LAB — HIV-1 RNA QUANT-NO REFLEX-BLD
HIV 1 RNA Quant: NOT DETECTED Copies/mL
HIV-1 RNA Quant, Log: NOT DETECTED Log cps/mL

## 2021-07-18 LAB — COMPLETE METABOLIC PANEL WITH GFR
AG Ratio: 1.1 (calc) (ref 1.0–2.5)
ALT: 10 U/L (ref 6–29)
AST: 16 U/L (ref 10–35)
Albumin: 4 g/dL (ref 3.6–5.1)
Alkaline phosphatase (APISO): 92 U/L (ref 37–153)
BUN: 11 mg/dL (ref 7–25)
CO2: 30 mmol/L (ref 20–32)
Calcium: 8.8 mg/dL (ref 8.6–10.4)
Chloride: 99 mmol/L (ref 98–110)
Creat: 0.71 mg/dL (ref 0.50–1.05)
Globulin: 3.7 g/dL (calc) (ref 1.9–3.7)
Glucose, Bld: 73 mg/dL (ref 65–99)
Potassium: 4.1 mmol/L (ref 3.5–5.3)
Sodium: 138 mmol/L (ref 135–146)
Total Bilirubin: 0.6 mg/dL (ref 0.2–1.2)
Total Protein: 7.7 g/dL (ref 6.1–8.1)
eGFR: 97 mL/min/{1.73_m2} (ref >=60)

## 2021-07-24 ENCOUNTER — Ambulatory Visit (INDEPENDENT_AMBULATORY_CARE_PROVIDER_SITE_OTHER): Payer: Commercial Managed Care - HMO | Admitting: Internal Medicine

## 2021-07-24 ENCOUNTER — Other Ambulatory Visit: Payer: Self-pay

## 2021-07-24 VITALS — BP 151/97 | HR 76 | Resp 16 | Ht 65.4 in | Wt 390.2 lb

## 2021-07-24 DIAGNOSIS — B20 Human immunodeficiency virus [HIV] disease: Secondary | ICD-10-CM

## 2021-07-24 DIAGNOSIS — R635 Abnormal weight gain: Secondary | ICD-10-CM

## 2021-07-24 DIAGNOSIS — Z21 Asymptomatic human immunodeficiency virus [HIV] infection status: Secondary | ICD-10-CM

## 2021-07-24 DIAGNOSIS — I10 Essential (primary) hypertension: Secondary | ICD-10-CM

## 2021-07-24 DIAGNOSIS — E7800 Pure hypercholesterolemia, unspecified: Secondary | ICD-10-CM | POA: Diagnosis not present

## 2021-07-24 DIAGNOSIS — Z79899 Other long term (current) drug therapy: Secondary | ICD-10-CM

## 2021-07-24 MED ORDER — DELSTRIGO 100-300-300 MG PO TABS: 1.0000 | ORAL_TABLET | Freq: Every day | ORAL | 11 refills | Status: DC

## 2021-07-24 NOTE — Progress Notes (Signed)
RFV: follow up for hiv disease  Patient ID: Claudia Brady, female   DOB: 1961/04/03, 61 y.o.   MRN: [id number]  HPI Claudia Brady is a 61yo F with well controlled HIV disease, currently on delstrigo. Not missing a dose. Has been in good health since we have seen her. She states that she is Not out often. Will get covid vaccine bivalent today  Sochx: only told her son and daughter about her dx Outpatient Encounter Medications as of 07/24/2021  Medication Sig   Bioflavonoid Products (VITAMIN C) CHEW Chew by mouth.   BIOTIN PO Take by mouth.   Cholecalciferol (VITAMIN D) 50 MCG (2000 UT) CAPS Take by mouth.   DELSTRIGO 100-300-300 MG TABS per tablet TAKE 1 TABLET BY MOUTH DAILY   DULoxetine (CYMBALTA) 30 MG capsule TAKE 2 CAPSULES BY MOUTH DAILY   labetalol (NORMODYNE) 100 MG tablet TAKE 1 TABLET(100 MG) BY MOUTH TWICE DAILY   losartan (COZAAR) 100 MG tablet Take 1 tablet (100 mg total) by mouth daily.   Multiple Vitamin (MULTIVITAMIN WITH MINERALS) TABS tablet Take 1 tablet by mouth daily.  (Patient not taking: Reported on 07/24/2021)   [DISCONTINUED] diclofenac Sodium (VOLTAREN) 1 % GEL Apply 2 g topically 4 (four) times daily. (Patient not taking: Reported on 11/04/2020)   No facility-administered encounter medications on file as of 07/24/2021.     Patient Active Problem List   Diagnosis Date Noted   Hemorrhoid 04/05/2019   Healthcare maintenance 04/05/2019   Morbid obesity ([place]) 04/19/2017   HTN (hypertension) 10/23/2016   Human immunodeficiency virus I infection ([place]) 10/02/2016   Poor dentition 10/02/2016   Obstructive sleep apnea 08/20/2010     Health Maintenance Due  Topic Date Due   Zoster Vaccines- Shingrix (1 of 2) Never done   PAP SMEAR-Modifier  02/05/2019   MAMMOGRAM  05/21/2019   COVID-19 Vaccine (4 - Booster for [place] series) 07/25/2020   INFLUENZA VACCINE  02/10/2021     Review of Systems Review of Systems  Constitutional: Negative for fever, chills,  diaphoresis, activity change, appetite change, fatigue and unexpected weight change.  HENT: Negative for congestion, sore throat, rhinorrhea, sneezing, trouble swallowing and sinus pressure.  Eyes: Negative for photophobia and visual disturbance.  Respiratory: Negative for cough, chest tightness, shortness of breath, wheezing and stridor.  Cardiovascular: Negative for chest pain, palpitations and leg swelling.  Gastrointestinal: Negative for nausea, vomiting, abdominal pain, diarrhea, constipation, blood in stool, abdominal distention and anal bleeding.  Genitourinary: Negative for dysuria, hematuria, flank pain and difficulty urinating.  Musculoskeletal: Negative for myalgias, back pain, joint swelling, arthralgias and gait problem.  Skin: Negative for color change, pallor, rash and wound.  Neurological: Negative for dizziness, tremors, weakness and light-headedness.  Hematological: Negative for adenopathy. Does not bruise/bleed easily.  Psychiatric/Behavioral: Negative for behavioral problems, confusion, sleep disturbance, dysphoric mood, decreased concentration and agitation.   Physical Exam   BP (!) 151/97    Pulse 76    Resp 16    Ht 5' 5.4" (1.661 m)    Wt (!) 390 lb 3.2 oz (177 kg)    SpO2 97%    BMI 64.14 kg/m   Physical Exam  Constitutional:  oriented to person, place, and time. appears well-developed and well-nourished. No distress.  HENT: [place]/AT, PERRLA, no scleral icterus Mouth/Throat: Oropharynx is clear and moist. No oropharyngeal exudate.  Cardiovascular: Normal rate, regular rhythm and normal heart sounds. Exam reveals no gallop and no friction rub.  No murmur heard.  Pulmonary/Chest: Effort normal and breath  sounds normal. No respiratory distress.  has no wheezes.  Neck = supple, no nuchal rigidity Lymphadenopathy: no cervical adenopathy. No axillary adenopathy Neurological: alert and oriented to person, place, and time.  Skin: Skin is warm and dry. No rash noted. No  erythema.  Psychiatric: a normal mood and affect.  behavior is normal.   Lab Results  Component Value Date   CD4TCELL 43 07/16/2021   Lab Results  Component Value Date   CD4TABS 860 07/16/2021   CD4TABS 810 08/15/2020   CD4TABS 881 04/23/2020   Lab Results  Component Value Date   HIV1RNAQUANT Not Detected 07/16/2021   Lab Results  Component Value Date   HEPBSAB NEG 10/02/2016   Lab Results  Component Value Date   LABRPR NON-REACTIVE 08/15/2020    CBC Lab Results  Component Value Date   WBC 6.3 07/16/2021   RBC 4.26 07/16/2021   HGB 12.3 07/16/2021   HCT 38.4 07/16/2021   PLT 280 07/16/2021   MCV 90.1 07/16/2021   MCH 28.9 07/16/2021   MCHC 32.0 07/16/2021   RDW 14.8 07/16/2021   LYMPHSABS 2,060 07/16/2021   MONOABS 0.5 03/17/2014   EOSABS 164 07/16/2021    BMET Lab Results  Component Value Date   NA 138 07/16/2021   K 4.1 07/16/2021   CL 99 07/16/2021   CO2 30 07/16/2021   GLUCOSE 73 07/16/2021   BUN 11 07/16/2021   CREATININE 0.71 07/16/2021   CALCIUM 8.8 07/16/2021   GFRNONAA 86 08/15/2020   GFRAA 100 08/15/2020      Assessment and Plan  Hiv disease = well controlled. Continue on delstrigo. And will give refills.   Long term medication management = cr is stable  Hypercholesteremia = continue with losartan  Health maintenance = will give bivalent today  Overweight = trying to lose weight  Htn = not at goal, recommend to follow up again with PCP to see if need change her regimen.

## 2021-07-28 DIAGNOSIS — G4733 Obstructive sleep apnea (adult) (pediatric): Secondary | ICD-10-CM | POA: Diagnosis not present

## 2021-07-31 DIAGNOSIS — G4733 Obstructive sleep apnea (adult) (pediatric): Secondary | ICD-10-CM | POA: Diagnosis not present

## 2021-08-28 DIAGNOSIS — G4733 Obstructive sleep apnea (adult) (pediatric): Secondary | ICD-10-CM | POA: Diagnosis not present

## 2021-09-25 DIAGNOSIS — G4733 Obstructive sleep apnea (adult) (pediatric): Secondary | ICD-10-CM | POA: Diagnosis not present

## 2021-10-29 DIAGNOSIS — G4733 Obstructive sleep apnea (adult) (pediatric): Secondary | ICD-10-CM | POA: Diagnosis not present

## 2021-11-10 ENCOUNTER — Other Ambulatory Visit: Payer: Self-pay

## 2021-11-10 DIAGNOSIS — I10 Essential (primary) hypertension: Secondary | ICD-10-CM

## 2021-11-12 MED ORDER — LABETALOL HCL 100 MG PO TABS: ORAL_TABLET | ORAL | 1 refills | Status: DC

## 2021-11-19 NOTE — Telephone Encounter (Signed)
ERROR

## 2021-11-27 ENCOUNTER — Encounter: Payer: Self-pay | Admitting: *Deleted

## 2021-12-11 ENCOUNTER — Encounter: Payer: Commercial Managed Care - HMO | Admitting: Internal Medicine

## 2021-12-15 ENCOUNTER — Ambulatory Visit (INDEPENDENT_AMBULATORY_CARE_PROVIDER_SITE_OTHER): Payer: Medicare Other | Admitting: Internal Medicine

## 2021-12-15 DIAGNOSIS — J22 Unspecified acute lower respiratory infection: Secondary | ICD-10-CM | POA: Diagnosis not present

## 2021-12-15 NOTE — Progress Notes (Unsigned)
  [facility] Internal Medicine Residency Telephone Encounter Continuity Care Appointment  HPI:  This telephone encounter was created for [name] on 12/15/2021 for the following purpose/cc flu like symptoms.   Past Medical History:  Past Medical History:  Diagnosis Date   Abnormal uterine bleeding (AUB) 02/04/2018   ATRIAL FIBRILLATION 08/20/2010   Qualifier: History of  By: Burt Knack CMA, Lori     Chronic pain    Depression 11/28/2010   Duodenal ulcer Jan. 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 Echo in November 2019 showed EF of 0000000 and no diastolic dysfunction. Her symptoms of LE swelling may be due to venous insufficiency and obesity. She also has a history of atrial fibrillation per chart review but EKGs in system do not show this and it may have been paroxysmal leading t     ROS:  Dry cough, and fatigue but otherwise negative   Assessment / Plan / Recommendations:  Please see A&P under problem oriented charting for assessment of the patient's acute and chronic medical conditions.  As always, pt is advised that if symptoms worsen or new symptoms arise, they should go to an urgent care facility or to to ER for further evaluation.   Consent and Medical Decision Making:  Patient discussed with Dr. Dareen Piano This is a telephone encounter between Claudia Brady and Idamae Schuller on 12/15/2021 for flu like symptoms. The visit was  conducted with the patient located at home and Idamae Schuller at [facility]. The patient's identity was confirmed using their DOB and current address. The patient has consented to being evaluated through a telephone encounter and understands the associated risks (an examination cannot be done and the patient may need to come in for an appointment) / benefits (allows the patient to remain at home, decreasing exposure to coronavirus). I personally spent 22 minutes on medical discussion.

## 2021-12-15 NOTE — Progress Notes (Deleted)
   CC: follow up  HPI:  [name] is a 61 y.o. with medical history as below presenting to [facility] for routine follow up.  Please see problem-based list for further details, assessments, and plans.  Past Medical History:  Diagnosis Date   Abnormal uterine bleeding (AUB) 02/04/2018   ATRIAL FIBRILLATION 08/20/2010   Qualifier: History of  By: Excell Seltzer CMA, Lori     Chronic pain    Depression 11/28/2010   Duodenal ulcer Jan. 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 Echo in November 2019 showed EF of 55-60% and no diastolic dysfunction. Her symptoms of LE swelling may be due to venous insufficiency and obesity. She also has a history of atrial fibrillation per chart review but EKGs in system do not show this and it may have been paroxysmal leading t   Review of Systems:  Review of system negative unless stated in the problem list or HPI.    Physical Exam:  There were no vitals filed for this visit.  Physical Exam  Assessment & Plan:   See Encounters Tab for problem based charting.  Patient discussed with Dr. {NAMES:[id number]::"Butcher","Guilloud","Hoffman","Mullen","Narendra","Raines","Vincent"} Gwenevere Abbot, MD     Cough/Cold Symptoms for 2 weeks Assessment/Plan Presenting with complaints of chest congestions, fatigue, coughing, yellow mucus production, diarrhea.   Used elderberry, lemon and other concotions.   States she had  improvement with OTC meds.   No complaints of SHOB. Constellation of sxs consistent with viral infection, however potential increase in sputum production and sputum color change from yellow to green over the past couple of days is concerning for pneumonia.   Counseled to continue OTC medications for symptomatic improvement but will prescribe an agent for unremitting and bothersome cough.

## 2021-12-16 DIAGNOSIS — J22 Unspecified acute lower respiratory infection: Secondary | ICD-10-CM | POA: Insufficient documentation

## 2021-12-16 HISTORY — DX: Unspecified acute lower respiratory infection: J22

## 2021-12-16 NOTE — Assessment & Plan Note (Addendum)
Patient had viral LRTI that appears to have resolved. Encouraged continued hydration. Advised on return precautions and advised to schedule a in person follow up appointment routine care.

## 2021-12-18 NOTE — Progress Notes (Signed)
Internal Medicine Clinic Attending  Case discussed with Dr. Khan  At the time of the visit.  We reviewed the resident's history and exam and pertinent patient test results.  I agree with the assessment, diagnosis, and plan of care documented in the resident's note.  

## 2021-12-22 ENCOUNTER — Encounter: Payer: Self-pay | Admitting: Infectious Diseases

## 2022-01-06 ENCOUNTER — Encounter: Payer: Medicare Other | Admitting: Internal Medicine

## 2022-01-06 ENCOUNTER — Ambulatory Visit: Payer: Medicare Other

## 2022-01-06 NOTE — Progress Notes (Deleted)
CC: PCP follow up  HPI:  [name] is a 61 y.o. with medical history as below presenting to [facility] for follow up.  Please see problem-based list for further details, assessments, and plans.  Past Medical History:  Diagnosis Date   Abnormal uterine bleeding (AUB) 02/04/2018   ATRIAL FIBRILLATION 08/20/2010   Qualifier: History of  By: Excell Seltzer CMA, Lori     Chronic pain    Depression 11/28/2010   Duodenal ulcer Jan. 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 Echo in November 2019 showed EF of 55-60% and no diastolic dysfunction. Her symptoms of LE swelling may be due to venous insufficiency and obesity. She also has a history of atrial fibrillation per chart review but EKGs in system do not show this and it may have been paroxysmal leading t     Current Outpatient Medications (Cardiovascular):    labetalol (NORMODYNE) 100 MG tablet, TAKE 1 TABLET(100 MG) BY MOUTH TWICE DAILY   losartan (COZAAR) 100 MG tablet, Take 1 tablet (100 mg total) by mouth daily.     Current Outpatient Medications (Other):    Bioflavonoid Products (VITAMIN C) CHEW, Chew by mouth.   BIOTIN PO, Take by mouth.   Cholecalciferol (VITAMIN D) 50 MCG (2000 UT) CAPS, Take by mouth.   doravirin-lamivudin-tenofov df (DELSTRIGO) 100-300-300 MG TABS per tablet, Take 1 tablet by mouth daily.   DULoxetine (CYMBALTA) 30 MG capsule, TAKE 2 CAPSULES BY MOUTH DAILY    Multiple Vitamin (MULTIVITAMIN WITH MINERALS) TABS tablet, Take 1 tablet by mouth daily.  (Patient not taking: Reported on 07/24/2021)  Review of Systems:  Review of system negative unless stated in the problem list or HPI.    Physical Exam:  There were no vitals filed for this visit.  Physical Exam General: NAD HENT: NCAT Lungs: CTAB, no wheeze, rhonchi or rales.  Cardiovascular: Normal heart sounds, no r/m/g, 2+ pulses in all extremities. No LE edema Abdomen: No TTP, normal bowel sounds MSK: No asymmetry or muscle atrophy.  Skin: no lesions noted on exposed skin Neuro: Alert and oriented x4. CN grossly intact Psych: Normal mood and normal affect   Assessment & Plan:   See Encounters Tab for problem based charting.  Patient discussed with Dr. {NAMES:[id number]::"Butcher","Guilloud","Hoffman","Mullen","Narendra","Raines","Vincent"} Gwenevere Abbot, MD   HIV CD4 count 840. On Delstrigo 100-300-300 mg qd. Saw ID 07/2021 HTN Assessment/Plan BP is not at goal. BP in clinic today HR ***. Reported home readings with SBP ***, and Dia ***. Does not monitor BP level at home; educated on the importance of it. Home medications include labetalol 100 mg BID, losartan 100 mg qd. Reports good poor medication compliance. Tolerating medication without adverse effects. Denies headaches, vision changes, lightheadedness, chest pain, SHOB, leg swelling or changes in speech. Exam benign and vitals otherwise wnl. Last creatinine was 0.71 in 07/2021. Counseled on the importance of  daily exercise, low salt diet, and weight loss. Continue *** Start *** BP log and bring to next visit  Continue lifestyle changes  Anxiety Duloxetine 60 mg qd  OSA Weight 375 last visit, 390 lb in 07/2021, *** today Uses CPAP  Care Gaps Last colonoscopy

## 2022-02-07 ENCOUNTER — Emergency Department (HOSPITAL_COMMUNITY)
Admission: EM | Admit: 2022-02-07 | Discharge: 2022-02-07 | Disposition: A | Discharge status: Home / Self Care | Payer: Medicare Other | Attending: Emergency Medicine | Admitting: Emergency Medicine

## 2022-02-07 ENCOUNTER — Emergency Department (HOSPITAL_COMMUNITY): Payer: Medicare Other

## 2022-02-07 ENCOUNTER — Other Ambulatory Visit: Payer: Self-pay

## 2022-02-07 DIAGNOSIS — W06XXXA Fall from bed, initial encounter: Secondary | ICD-10-CM | POA: Diagnosis not present

## 2022-02-07 DIAGNOSIS — M542 Cervicalgia: Secondary | ICD-10-CM | POA: Insufficient documentation

## 2022-02-07 DIAGNOSIS — J3489 Other specified disorders of nose and nasal sinuses: Secondary | ICD-10-CM | POA: Diagnosis not present

## 2022-02-07 DIAGNOSIS — S0990XA Unspecified injury of head, initial encounter: Secondary | ICD-10-CM | POA: Diagnosis not present

## 2022-02-07 DIAGNOSIS — M62838 Other muscle spasm: Secondary | ICD-10-CM | POA: Insufficient documentation

## 2022-02-07 DIAGNOSIS — S199XXA Unspecified injury of neck, initial encounter: Secondary | ICD-10-CM | POA: Diagnosis not present

## 2022-02-07 DIAGNOSIS — R930 Abnormal findings on diagnostic imaging of skull and head, not elsewhere classified: Secondary | ICD-10-CM | POA: Insufficient documentation

## 2022-02-07 DIAGNOSIS — M25512 Pain in left shoulder: Secondary | ICD-10-CM | POA: Diagnosis not present

## 2022-02-07 MED ORDER — CYCLOBENZAPRINE HCL 10 MG PO TABS
10.0000 mg | ORAL_TABLET | Freq: Once | ORAL | Status: AC
  Administered 2022-02-07: 10 mg via ORAL
  Filled 2022-02-07: qty 1

## 2022-02-07 MED ORDER — IBUPROFEN 400 MG PO TABS: 400.0000 mg | ORAL_TABLET | Freq: Once | ORAL | Status: DC

## 2022-02-07 MED ORDER — CYCLOBENZAPRINE HCL 10 MG PO TABS: 10.0000 mg | ORAL_TABLET | Freq: Two times a day (BID) | ORAL | 0 refills | Status: DC | PRN

## 2022-02-07 MED ORDER — ACETAMINOPHEN 325 MG PO TABS
650.0000 mg | ORAL_TABLET | Freq: Once | ORAL | Status: AC
  Administered 2022-02-07: 650 mg via ORAL
  Filled 2022-02-07: qty 2

## 2022-02-07 NOTE — Discharge Instructions (Addendum)
Recommend Tylenol and ibuprofen for further pain.  I have prescribed you muscle relaxant which is sedating so do not use with alcohol or drugs or dangerous activities including driving.

## 2022-02-07 NOTE — ED Triage Notes (Signed)
Pt reports being asleep while sitting on the side of the bed and then falling face forwards out of the bed and landing on her face. Denies blood thinners, LOC, or headache. C/o L shoulder pain.

## 2022-02-07 NOTE — ED Provider Notes (Signed)
[facility] EMERGENCY DEPARTMENT Provider Note   CSN: [id number] Arrival date & time: 02/07/22  8938     History  Chief Complaint  Patient presents with   Claudia Brady    Claudia Brady is a 61 y.o. female.  Patient here with left shoulder pain after she rolled out of bed.  Patient rolled out of bed landing on her left side.  Did not lose consciousness.  Has no headache or neck pain.  Has discomfort when she moves her left shoulder.  Feels tight in the left upper shoulder area.  Denies any numbness, weakness, tingling.  She is not on blood thinners.  No lower extremity pain she is been ambulatory since this fall.  Denies any chest pain, shortness of breath.  The history is provided by the patient.       Home Medications Prior to Admission medications   Medication Sig Start Date End Date Taking? Authorizing Provider  cyclobenzaprine (FLEXERIL) 10 MG tablet Take 1 tablet (10 mg total) by mouth 2 (two) times daily as needed for muscle spasms. 02/07/22  Yes Jenefer Woerner, DO  Bioflavonoid Products (VITAMIN C) CHEW Chew by mouth.    [provider]  BIOTIN PO Take by mouth.    [provider]  Cholecalciferol (VITAMIN D) 50 MCG (2000 UT) CAPS Take by mouth.    [provider]  doravirin-lamivudin-tenofov df (DELSTRIGO) 100-300-300 MG TABS per tablet Take 1 tablet by mouth daily. 07/24/21   Judyann Munson, MD  DULoxetine (CYMBALTA) 30 MG capsule TAKE 2 CAPSULES BY MOUTH DAILY 11/15/20   Claudean Severance, MD  labetalol (NORMODYNE) 100 MG tablet TAKE 1 TABLET(100 MG) BY MOUTH TWICE DAILY 11/12/21   Gwenevere Abbot, MD  losartan (COZAAR) 100 MG tablet Take 1 tablet (100 mg total) by mouth daily. 12/31/20   Dolan Amen, MD  Multiple Vitamin (MULTIVITAMIN WITH MINERALS) TABS tablet Take 1 tablet by mouth daily.  Patient not taking: Reported on 07/24/2021    [provider]      Allergies    Ace inhibitors, Other, and Penicillins    Review of  Systems   Review of Systems  Physical Exam Updated Vital Signs BP (!) 186/160 (BP Location: Right Wrist)   Pulse 77   Temp 97.8 F (36.6 C) (Oral)   Resp 18   SpO2 92%  Physical Exam Vitals and nursing note reviewed.  Constitutional:      General: She is not in acute distress.    Appearance: She is well-developed. She is not ill-appearing.  HENT:     Head: Normocephalic and atraumatic.  Eyes:     Extraocular Movements: Extraocular movements intact.     Conjunctiva/sclera: Conjunctivae normal.     Pupils: Pupils are equal, round, and reactive to light.  Neck:     Comments: No midline spinal pain but has paraspinal lower cervical muscle tenderness Cardiovascular:     Rate and Rhythm: Normal rate and regular rhythm.     Heart sounds: No murmur heard. Pulmonary:     Effort: Pulmonary effort is normal. No respiratory distress.     Breath sounds: Normal breath sounds.  Abdominal:     Palpations: Abdomen is soft.     Tenderness: There is no abdominal tenderness.  Musculoskeletal:        General: Tenderness present. No swelling. Normal range of motion.     Cervical back: Normal range of motion and neck supple. Tenderness present.     Comments: Tenderness over the  left shoulder, increased tone in the left trapezius muscle, no midline spinal tenderness  Skin:    General: Skin is warm and dry.     Capillary Refill: Capillary refill takes less than 2 seconds.  Neurological:     General: No focal deficit present.     Mental Status: She is alert and oriented to person, place, and time.     Cranial Nerves: No cranial nerve deficit.     Sensory: No sensory deficit.     Motor: No weakness.     Coordination: Coordination normal.     Comments: 5+ out of 5 strength throughout, normal sensation, no drift, normal finger-nose-finger, normal speech  Psychiatric:        Mood and Affect: Mood normal.     ED Results / Procedures / Treatments   Labs (all labs ordered are listed, but only  abnormal results are displayed) Labs Reviewed - No data to display  EKG None  Radiology CT Cervical Spine Wo Contrast  Result Date: 02/07/2022 CLINICAL DATA:  61 year old female with trauma after falling out of bed EXAM: CT HEAD WITHOUT CONTRAST CT CERVICAL SPINE WITHOUT CONTRAST TECHNIQUE: Multidetector CT imaging of the head and cervical spine was performed following the standard protocol without intravenous contrast. Multiplanar CT image reconstructions of the cervical spine were also generated. RADIATION DOSE REDUCTION: This exam was performed according to the departmental dose-optimization program which includes automated exposure control, adjustment of the mA and/or kV according to patient size and/or use of iterative reconstruction technique. COMPARISON:  07/17/2010 FINDINGS: CT HEAD FINDINGS Brain: No acute intracranial hemorrhage. No midline shift or mass effect. Gray-white differentiation maintained. Unremarkable appearance of the ventricular system. Vascular: Unremarkable. Skull: No acute fracture.  No aggressive bone lesion identified. Sinuses/Orbits: Unremarkable appearance of the orbits. Trace mucoperiosteal thickening of the paranasal sinuses Other: None CT CERVICAL SPINE FINDINGS Note that the detail of the lower cervical spine is limited by streak artifact/attenuation, limiting evaluation for subtle fractures. Alignment: Craniocervical junction aligned. Anatomic alignment of the cervical elements. No subluxation. Skull base and vertebrae: No acute fracture at the skullbase. Vertebral body heights relatively maintained. No acute displaced fracture identified. Soft tissues and spinal canal: Unremarkable cervical soft tissues. Lymph nodes are present, though not enlarged. Disc levels: Early disc space narrowing and endplate changes throughout the cervical spine. Upper chest: Unremarkable appearance of the lung apices. Other: No bony canal narrowing. IMPRESSION: Head CT: No acute intracranial  abnormality Cervical CT: No acute fracture or malalignment of the cervical spine Electronically Signed   By: Gilmer Mor D.O.   On: 02/07/2022 10:27   CT HEAD WO CONTRAST ( )  Result Date: 02/07/2022 CLINICAL DATA:  61 year old female with trauma after falling out of bed EXAM: CT HEAD WITHOUT CONTRAST CT CERVICAL SPINE WITHOUT CONTRAST TECHNIQUE: Multidetector CT imaging of the head and cervical spine was performed following the standard protocol without intravenous contrast. Multiplanar CT image reconstructions of the cervical spine were also generated. RADIATION DOSE REDUCTION: This exam was performed according to the departmental dose-optimization program which includes automated exposure control, adjustment of the mA and/or kV according to patient size and/or use of iterative reconstruction technique. COMPARISON:  07/17/2010 FINDINGS: CT HEAD FINDINGS Brain: No acute intracranial hemorrhage. No midline shift or mass effect. Gray-white differentiation maintained. Unremarkable appearance of the ventricular system. Vascular: Unremarkable. Skull: No acute fracture.  No aggressive bone lesion identified. Sinuses/Orbits: Unremarkable appearance of the orbits. Trace mucoperiosteal thickening of the paranasal sinuses Other: None CT CERVICAL SPINE FINDINGS  Note that the detail of the lower cervical spine is limited by streak artifact/attenuation, limiting evaluation for subtle fractures. Alignment: Craniocervical junction aligned. Anatomic alignment of the cervical elements. No subluxation. Skull base and vertebrae: No acute fracture at the skullbase. Vertebral body heights relatively maintained. No acute displaced fracture identified. Soft tissues and spinal canal: Unremarkable cervical soft tissues. Lymph nodes are present, though not enlarged. Disc levels: Early disc space narrowing and endplate changes throughout the cervical spine. Upper chest: Unremarkable appearance of the lung apices. Other: No bony canal  narrowing. IMPRESSION: Head CT: No acute intracranial abnormality Cervical CT: No acute fracture or malalignment of the cervical spine Electronically Signed   By: Gilmer Mor D.O.   On: 02/07/2022 10:27   DG Shoulder Left  Result Date: 02/07/2022 CLINICAL DATA:  Fall.  Shoulder pain. EXAM: LEFT SHOULDER - 2+ VIEW COMPARISON:  None Available. FINDINGS: There is no evidence of fracture or dislocation. There is no evidence of arthropathy or other focal bone abnormality. Soft tissues are unremarkable. IMPRESSION: Negative. Electronically Signed   By: Gerome Sam III M.D.   On: 02/07/2022 10:10    Procedures Procedures    Medications Ordered in ED Medications  acetaminophen (TYLENOL) tablet 650 mg (650 mg Oral Given 02/07/22 1023)  cyclobenzaprine (FLEXERIL) tablet 10 mg (10 mg Oral Given 02/07/22 1024)    ED Course/ Medical Decision Making/ A&P                           Medical Decision Making Amount and/or Complexity of Data Reviewed Radiology: ordered.  Risk OTC drugs. Prescription drug management.   Claudia Brady is here with left shoulder pain, left-sided neck pain after fall.  Patient rolled out of bed, landed on the left shoulder, face planted.  Not on blood thinners.  No loss of consciousness.  She is having tenderness to the paraspinal cervical muscles on the left as well as the left trapezius muscles and left shoulder.  There is no obvious deformities on exam.  She states that it hurts to count or turn her head.  She has no midline spinal tenderness.  We will get CT scan of her head, neck, x-ray of the left shoulder.  Give muscle relaxant, Tylenol and reevaluate.  Neurovascular neuromuscular she is intact.  No other extremity pain.  Patient per my review and interpretation of her images there is no shoulder injury or fracture.  No sign of head or neck injury.  On CT scans.  Recommend continued use of Tylenol and ibuprofen and Flexeril.  Overall suspect muscle  spasm/contusion.  Discharged in good condition.  This chart was dictated using voice recognition software.  Despite best efforts to proofread,  errors can occur which can change the documentation meaning.         Final Clinical Impression(s) / ED Diagnoses Final diagnoses:  Muscle spasm    Rx / DC Orders ED Discharge Orders          Ordered    cyclobenzaprine (FLEXERIL) 10 MG tablet  2 times daily PRN        02/07/22 1124              Annice Jolly, DO 02/07/22 1125

## 2022-02-18 ENCOUNTER — Encounter (INDEPENDENT_AMBULATORY_CARE_PROVIDER_SITE_OTHER): Payer: Self-pay

## 2022-03-26 ENCOUNTER — Ambulatory Visit (INDEPENDENT_AMBULATORY_CARE_PROVIDER_SITE_OTHER): Payer: Medicare Other | Admitting: Internal Medicine

## 2022-03-26 ENCOUNTER — Encounter: Payer: Self-pay | Admitting: Internal Medicine

## 2022-03-26 VITALS — BP 174/109 | HR 79 | Temp 98.1°F | Ht 65.4 in | Wt 397.9 lb

## 2022-03-26 DIAGNOSIS — Z23 Encounter for immunization: Secondary | ICD-10-CM | POA: Diagnosis not present

## 2022-03-26 DIAGNOSIS — G4733 Obstructive sleep apnea (adult) (pediatric): Secondary | ICD-10-CM

## 2022-03-26 DIAGNOSIS — M79672 Pain in left foot: Secondary | ICD-10-CM | POA: Diagnosis not present

## 2022-03-26 DIAGNOSIS — Z0000 Encounter for general adult medical examination without abnormal findings: Secondary | ICD-10-CM

## 2022-03-26 DIAGNOSIS — B20 Human immunodeficiency virus [HIV] disease: Secondary | ICD-10-CM

## 2022-03-26 DIAGNOSIS — M79671 Pain in right foot: Secondary | ICD-10-CM | POA: Diagnosis not present

## 2022-03-26 DIAGNOSIS — I10 Essential (primary) hypertension: Secondary | ICD-10-CM | POA: Diagnosis not present

## 2022-03-26 NOTE — Patient Instructions (Signed)
Claudia Brady, it was a pleasure seeing you today! You endorsed feeling well today. Below are some of the things we talked about this visit. We look forward to seeing you in the follow up appointment!  Today we discussed: Restart taking your blood pressure medications.  Continue your HIV medications. We will refer you to weight loss clinic.  We will also refer you to podiatry for foot pain as you are interested in customized foot wear. Weight loss will help with foot pain.   I have ordered the following labs today:   Lab Orders         BMP8+Anion Gap       Referrals ordered today:    Referral Orders         Amb Ref to Medical Weight Management         Ambulatory referral to Podiatry      I have ordered the following medication/changed the following medications:   Stop the following medications: Medications Discontinued During This Encounter  Medication Reason   DULoxetine (CYMBALTA) 30 MG capsule Patient has not taken in last 30 days     Start the following medications: No orders of the defined types were placed in this encounter.    Follow-up: 3 week follow up  Please make sure to arrive 15 minutes prior to your next appointment. If you arrive late, you may be asked to reschedule.   We look forward to seeing you next time. Please call our clinic at [phone] if you have any questions or concerns. The best time to call is Monday-Friday from 9am-4pm, but there is someone available 24/7. If after hours or the weekend, call the main hospital number and ask for the Internal Medicine Resident On-Call. If you need medication refills, please notify your pharmacy one week in advance and they will send [place] a request.  Thank you for letting us take part in your care. Wishing you the best!  Thank you, Gwenevere Abbot, MD

## 2022-03-26 NOTE — Progress Notes (Unsigned)
CC: PCP follow up  HPI:  [name] is a 61 y.o. with medical history of HTN, HIV, OSA and GERD presenting to [facility] for PCP follow up.   Please see problem-based list for further details, assessments, and plans.  Past Medical History:  Diagnosis Date   Abnormal uterine bleeding (AUB) 02/04/2018   ATRIAL FIBRILLATION 08/20/2010   Qualifier: History of  By: Excell Seltzer CMA, Lori     Chronic pain    Depression 11/28/2010   Duodenal ulcer Jan. 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 Echo in November 2019 showed EF of 55-60% and no diastolic dysfunction. Her symptoms of LE swelling may be due to venous insufficiency and obesity. She also has a history of atrial fibrillation per chart review but EKGs in system do not show this and it may have been paroxysmal leading t     Current Outpatient Medications (Cardiovascular):    labetalol (NORMODYNE) 100 MG tablet, TAKE 1 TABLET(100 MG) BY MOUTH TWICE DAILY   losartan (COZAAR) 100 MG tablet, Take 1 tablet (100 mg total) by mouth daily.     Current Outpatient Medications (Other):    Bioflavonoid Products (VITAMIN C) CHEW, Chew by mouth.   BIOTIN PO, Take by mouth.   Cholecalciferol (VITAMIN D) 50 MCG (2000 UT) CAPS, Take by mouth.   cyclobenzaprine (FLEXERIL) 10 MG tablet, Take 1 tablet (10 mg total) by mouth 2 (two) times daily as needed for muscle spasms.   doravirin-lamivudin-tenofov df  (DELSTRIGO) 100-300-300 MG TABS per tablet, Take 1 tablet by mouth daily.   DULoxetine (CYMBALTA) 30 MG capsule, TAKE 2 CAPSULES BY MOUTH DAILY   Multiple Vitamin (MULTIVITAMIN WITH MINERALS) TABS tablet, Take 1 tablet by mouth daily.  (Patient not taking: Reported on 07/24/2021)  Review of Systems:  Review of system negative unless stated in the problem list or HPI.    Physical Exam:  Vitals:   03/26/22 1454  BP: (!) 174/109  Pulse: 79  Temp: 98.1 F (36.7 C)  TempSrc: Oral  SpO2: 97%  Weight: (!) 397 lb 14.4 oz (180.5 kg)  Height: 5' 5.4" (1.661 m)    Physical Exam General: NAD HENT: NCAT Lungs: CTAB, no wheeze, rhonchi or rales.  Cardiovascular: Normal heart sounds, no r/m/g, 2+ pulses in all extremities. No LE edema Abdomen: No TTP, normal bowel sounds MSK: No asymmetry or muscle atrophy.  Skin: no lesions noted on exposed skin Neuro: Alert and oriented x4. CN grossly intact Psych: Normal mood and normal affect   Assessment & Plan:   No problem-specific Assessment & Plan notes found for this encounter.   See Encounters Tab for problem based charting.  Patient discussed with Dr. Joanie Coddington, MD [name], [name]   ID HIV RNA not detected, CD4 860 On delstrigo 100-300-300 Cr 0.71 07/2021  CBC  wnl 07/2021  A1c 5.4 01/2020  HTN 120s/70s-170s/90s Doesn't take any meds; it has been 3 months since last time she took her meds. Losartan 100 mg qd, Labetalol 100 mg qd. Cr 0.71 Repeat BMP   Chronic feet pain. Years, whole foot hearts.    OSA CPAP uses twice a week. Uses about 5 hours.

## 2022-03-27 LAB — BMP8+ANION GAP
Anion Gap: 13 mmol/L (ref 10.0–18.0)
BUN/Creatinine Ratio: 10 — ABNORMAL LOW (ref 12–28)
BUN: 7 mg/dL — ABNORMAL LOW (ref 8–27)
CO2: 27 mmol/L (ref 20–29)
Calcium: 9.1 mg/dL (ref 8.7–10.3)
Chloride: 100 mmol/L (ref 96–106)
Creatinine, Ser: 0.72 mg/dL (ref 0.57–1.00)
Glucose: 91 mg/dL (ref 70–99)
Potassium: 4.2 mmol/L (ref 3.5–5.2)
Sodium: 140 mmol/L (ref 134–144)
eGFR: 95 mL/min/1.73

## 2022-03-29 DIAGNOSIS — M79671 Pain in right foot: Secondary | ICD-10-CM | POA: Insufficient documentation

## 2022-03-29 NOTE — Assessment & Plan Note (Signed)
Patient has HIV and follows with ID. Last follow up was in 07/2021. She is taking delstrigo 100-300-300 mg. HIV RNA not detected, CD4 860 at that time. Advised pt to continue following up with ID.

## 2022-03-29 NOTE — Assessment & Plan Note (Signed)
Pt not taking any medications for her HTN. She states she has been using ginger and garlic to control her blood pressure. Her blood pressure is elevated at 174/109. Advised pt to start taking her medications. Advised she can continue using ginger and garlic along with the medications if she would like. Patient in agreement.  -Restart antihypertensives: losartan 100 mg qd, and Labetalol 100 mg qd. Pt has this at home.  -BMP this visit shows normal renal fxn.  -Follow up in 3 weeks to repeat BMP.

## 2022-03-29 NOTE — Assessment & Plan Note (Signed)
Referral to weight loss clinic placed.

## 2022-03-29 NOTE — Assessment & Plan Note (Signed)
Patient has OSA and uses CPAP twice a week for about 5 hours. Advised to increase use to every night as it will help with blood pressure control and help with obesity.

## 2022-03-29 NOTE — Assessment & Plan Note (Signed)
Pt has chronic diffuse bilateral foot pain. This appears secondary to her Class III obesity. Pt advised on weight loss and its benefits. She would like to get custom shoes to help with this. I believe they will provide some relief. She has few lesions that are raised and itchy. These could be eczema vs friction blisters. Advised continued moisturizing. Pt will benefit from podiatry referral for her foot pain and her lesions.  -Podiatry referral placed.

## 2022-03-29 NOTE — Assessment & Plan Note (Signed)
Flu shot given this visit. 

## 2022-04-06 NOTE — Progress Notes (Signed)
Internal Medicine Clinic Attending  Case discussed with Dr. Khan  at the time of the visit.  We reviewed the resident's history and exam and pertinent patient test results.  I agree with the assessment, diagnosis, and plan of care documented in the resident's note.  

## 2022-04-08 ENCOUNTER — Ambulatory Visit (INDEPENDENT_AMBULATORY_CARE_PROVIDER_SITE_OTHER): Payer: Medicare Other

## 2022-04-08 ENCOUNTER — Ambulatory Visit (INDEPENDENT_AMBULATORY_CARE_PROVIDER_SITE_OTHER): Payer: Medicare Other | Admitting: Podiatry

## 2022-04-08 DIAGNOSIS — M25371 Other instability, right ankle: Secondary | ICD-10-CM

## 2022-04-08 DIAGNOSIS — M25372 Other instability, left ankle: Secondary | ICD-10-CM

## 2022-04-08 DIAGNOSIS — M79671 Pain in right foot: Secondary | ICD-10-CM

## 2022-04-08 DIAGNOSIS — B353 Tinea pedis: Secondary | ICD-10-CM | POA: Diagnosis not present

## 2022-04-08 DIAGNOSIS — M79672 Pain in left foot: Secondary | ICD-10-CM | POA: Diagnosis not present

## 2022-04-08 MED ORDER — CLOTRIMAZOLE-BETAMETHASONE 1-0.05 % EX CREA: 1.0000 | TOPICAL_CREAM | Freq: Two times a day (BID) | CUTANEOUS | 0 refills | Status: DC

## 2022-04-08 NOTE — Progress Notes (Signed)
Subjective:  Patient ID: Claudia Brady, female    DOB: 17-May-1961,  MRN: [id number]  Chief Complaint  Patient presents with   Foot Pain    61 y.o. female presents with the above complaint.  Patient presents with bilateral ankle instability.  Patient states the ankles have been chronically causing her a lot of pain hurts with ambulation worse with pressure.  She does not want to do any Steroid injections she wanted get it evaluated.  She is feels like her ankle is giving out.  Especially during ambulation.  Pain scale is 7 out of 10.  When she has been on her feet for long period of time it causes her more discomfort.  She has not tried any kind of bracing.  She would like to discuss bracing options she also has athlete's foot for which she has tried and failed over-the-counter therapy.  She would like to discuss prescription options   Review of Systems: Negative except as noted in the HPI. Denies N/V/F/Ch.  Past Medical History:  Diagnosis Date   Abnormal uterine bleeding (AUB) 02/04/2018   ATRIAL FIBRILLATION 08/20/2010   Qualifier: History of  By: Excell Seltzer CMA, Lori     Chronic pain    Depression 11/28/2010   Duodenal ulcer Jan. 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 She endorsed symptoms of fevers, chills, coughing with sputum production, chest pain and dyspnea. She stated she tried tylenol, mucinex, and home remedies for her symptoms. Currently her only symptoms were dry cough and fatigue. She reported good recovery since the onset of her symptoms. Thorough hx of her symptoms and timeli   Migraine 09/17/2018   OSA (obstructive sleep apnea)    Patellofemoral arthralgia of both knees 03/25/2020    Plantar fascia syndrome    RESTLESS LEG SYNDROME 08/20/2010        Rhinitis    Shoulder injury 01/31/2020   Varicose vein of leg    Venous insufficiency 09/16/2018   Previously referred for ECHO due to swelling in her legs, dyspnea on exertion, orthopnea, and fast weight gain. Echo in November 2019 showed EF of 55-60% and no diastolic dysfunction. Her symptoms of LE swelling may be due to venous insufficiency and obesity. She also has a history of atrial fibrillation per chart review but EKGs in system do not show this and it may have been paroxysmal leading t    Current Outpatient Medications:    clotrimazole-betamethasone (LOTRISONE) cream, Apply 1 Application topically 2 (two) times daily., Disp: 30 g, Rfl: 0   Bioflavonoid Products (VITAMIN C) CHEW, Chew by mouth., Disp: , Rfl:    BIOTIN PO, Take by mouth., Disp: , Rfl:    Cholecalciferol (VITAMIN D) 50 MCG (2000 UT) CAPS, Take by mouth., Disp: , Rfl:    cyclobenzaprine (FLEXERIL) 10 MG tablet, Take 1 tablet (10 mg total) by mouth 2 (two) times daily as needed for muscle spasms., Disp: 20 tablet, Rfl: 0   doravirin-lamivudin-tenofov df (DELSTRIGO) 100-300-300 MG TABS per tablet, Take 1 tablet by mouth daily., Disp: 30 tablet, Rfl: 11   labetalol (NORMODYNE) 100 MG tablet, TAKE 1 TABLET(100 MG) BY MOUTH TWICE DAILY, Disp: 180 tablet, Rfl: 1  losartan (COZAAR) 100 MG tablet, Take 1 tablet (100 mg total) by mouth daily., Disp: 90 tablet, Rfl: 3  Social History   Tobacco Use  Smoking Status Former   Years: 15.00   Types: Cigarettes   Quit date: 12/22/2015   Years since quitting: 6.3  Smokeless Tobacco Never    Allergies  Allergen Reactions   Ace Inhibitors Cough   Other Rash    Patient states she is allergic to sanitary napkins.   Penicillins Swelling and Rash    States she has taken amoxicillin without difficulty.    Objective:  There were no vitals filed for this visit. There is no height or weight on file to calculate  BMI. Constitutional Well developed. Well nourished.  Vascular Dorsalis pedis pulses palpable bilaterally. Posterior tibial pulses palpable bilaterally. Capillary refill normal to all digits.  No cyanosis or clubbing noted. Pedal hair growth normal.  Neurologic Normal speech. Oriented to person, place, and time. Epicritic sensation to light touch grossly present bilaterally.  Dermatologic Nails well groomed and normal in appearance. No open wounds. No skin lesions.  Orthopedic: Negative anterior drawer test talar tilt test.  Pain on palpation at the ATFL ligament bilaterally.  Left is greater than right side.  No deep intra-articular ankle pain noted.  No pain at the Achilles tendon, peroneal tendon, posterior tibial tendon   Radiographs: None3 views of skeletally mature adult bilateral foot severe pes planovalgus deformity noted with plantar spurring posterior spurring midfoot arthritis.  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  Previous hardware noted appears to be in good position alignment. Assessment:   1. Ankle instability, right   2. Ankle instability, left   3. Tinea pedis of both feet    Plan:  Patient was evaluated and treated and all questions answered.  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  Primarily her issues are balance related therefore I believe she will benefit from an ankle bracing.  If there is an improvement with ankle brace we will discuss more advanced rigid bracing.  I discussed with patient she states understanding -Bilateral ankle brace were dispensed  Bilateral athlete's foot -I explained the patient the etiology of athlete's foot and worse treatment options were discussed.  Given that she has failed to over-the-counter medication she will benefit from Lotrisone cream.  Lotrisone cream was sent to the pharmacy.  No follow-ups on file.    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

## 2022-04-22 ENCOUNTER — Encounter: Payer: Medicare Other | Admitting: Family Medicine

## 2022-04-30 ENCOUNTER — Encounter: Payer: Medicare Other | Admitting: Family Medicine

## 2022-05-01 ENCOUNTER — Encounter: Payer: Medicare Other | Admitting: Family Medicine

## 2022-05-07 ENCOUNTER — Other Ambulatory Visit: Payer: Self-pay | Admitting: Internal Medicine

## 2022-05-07 DIAGNOSIS — I10 Essential (primary) hypertension: Secondary | ICD-10-CM

## 2022-05-11 ENCOUNTER — Encounter (INDEPENDENT_AMBULATORY_CARE_PROVIDER_SITE_OTHER): Payer: Self-pay

## 2022-05-13 ENCOUNTER — Encounter: Payer: Medicare Other | Admitting: Student

## 2022-05-18 ENCOUNTER — Ambulatory Visit: Payer: Medicare Other | Admitting: Internal Medicine

## 2022-05-22 ENCOUNTER — Ambulatory Visit: Payer: Medicare Other | Admitting: Podiatry

## 2022-06-01 ENCOUNTER — Encounter: Payer: Self-pay | Admitting: *Deleted

## 2022-06-01 NOTE — Progress Notes (Signed)
[facility] Quality Team Note  Name: Lyndsey Demos Date of Birth: [birth year] MRN: [id number] Date: 06/01/2022  [facility] Quality Team has reviewed this patient's chart, please see recommendations below:  [facility] Quality Other; (Pt has open gap for mammogram. Would need to be completed before end of 2023 to close gap.)

## 2022-06-19 ENCOUNTER — Ambulatory Visit (INDEPENDENT_AMBULATORY_CARE_PROVIDER_SITE_OTHER): Payer: Medicare Other | Admitting: Podiatry

## 2022-06-19 DIAGNOSIS — B353 Tinea pedis: Secondary | ICD-10-CM | POA: Diagnosis not present

## 2022-06-19 DIAGNOSIS — L819 Disorder of pigmentation, unspecified: Secondary | ICD-10-CM | POA: Diagnosis not present

## 2022-06-19 DIAGNOSIS — L989 Disorder of the skin and subcutaneous tissue, unspecified: Secondary | ICD-10-CM

## 2022-06-19 DIAGNOSIS — L309 Dermatitis, unspecified: Secondary | ICD-10-CM

## 2022-06-19 NOTE — Progress Notes (Signed)
Subjective:  Patient ID: Claudia Brady, female    DOB: Dec 18, 1960,  MRN: [id number]  Chief Complaint  Patient presents with   Tinea Pedis    61 y.o. female presents with the above complaint.  Patient presents with new complaint left lateral multiple skin lesions with documentation.  Patient states that ischemia is progressive gotten worse she wanted to get evaluated she has not seen anyone as prior to seeing me.  she does have history of hiv.  she has not seen a dermatologist.  he does not have any melanoma characteristics.  no pain.  some itching.   Review of Systems: Negative except as noted in the HPI. Denies N/V/F/Ch.  Past Medical History:  Diagnosis Date   Abnormal uterine bleeding (AUB) 02/04/2018   ATRIAL FIBRILLATION 08/20/2010   Qualifier: History of  By: Excell Seltzer CMA, Lori     Chronic pain    Depression 11/28/2010   Duodenal ulcer Jan. 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 She endorsed symptoms of fevers, chills, coughing with sputum production, chest pain and dyspnea. She stated she tried tylenol, mucinex, and home remedies for her symptoms. Currently her only symptoms were dry cough and fatigue. She reported good recovery since the onset of her symptoms. Thorough hx of her symptoms and timeli   Migraine 09/17/2018   OSA (obstructive sleep apnea)    Patellofemoral arthralgia of both knees 03/25/2020   Plantar fascia syndrome    RESTLESS LEG SYNDROME 08/20/2010        Rhinitis    Shoulder injury 01/31/2020   Varicose vein of leg    Venous insufficiency 09/16/2018   Previously referred for ECHO due to swelling in her legs, dyspnea on exertion, orthopnea, and fast weight gain. Echo in  November 2019 showed EF of 55-60% and no diastolic dysfunction. Her symptoms of LE swelling may be due to venous insufficiency and obesity. She also has a history of atrial fibrillation per chart review but EKGs in system do not show this and it may have been paroxysmal leading t    Current Outpatient Medications:    Bioflavonoid Products (VITAMIN C) CHEW, Chew by mouth., Disp: , Rfl:    BIOTIN PO, Take by mouth., Disp: , Rfl:    Cholecalciferol (VITAMIN D) 50 MCG (2000 UT) CAPS, Take by mouth., Disp: , Rfl:    clotrimazole-betamethasone (LOTRISONE) cream, Apply 1 Application topically 2 (two) times daily., Disp: 30 g, Rfl: 0   cyclobenzaprine (FLEXERIL) 10 MG tablet, Take 1 tablet (10 mg total) by mouth 2 (two) times daily as needed for muscle spasms., Disp: 20 tablet, Rfl: 0   doravirin-lamivudin-tenofov df (DELSTRIGO) 100-300-300 MG TABS per tablet, Take 1 tablet by mouth daily., Disp: 30 tablet, Rfl: 11   labetalol (NORMODYNE) 100 MG tablet, TAKE 1 TABLET(100 MG) BY MOUTH TWICE DAILY, Disp: 180 tablet, Rfl: 1   losartan (COZAAR) 100 MG tablet, Take 1 tablet (100 mg total) by mouth daily., Disp: 90 tablet, Rfl: 3  Social History   Tobacco Use  Smoking Status Former   Years: 15.00   Types: Cigarettes   Quit date: 12/22/2015   Years since quitting: 6.5  Smokeless Tobacco Never  Allergies  Allergen Reactions   Ace Inhibitors Cough   Other Rash    Patient states she is allergic to sanitary napkins.   Penicillins Swelling and Rash    States she has taken amoxicillin without difficulty.    Objective:  There were no vitals filed for this visit. There is no height or weight on file to calculate BMI. Constitutional Well developed. Well nourished.  Vascular Dorsalis pedis pulses palpable bilaterally. Posterior tibial pulses palpable bilaterally. Capillary refill normal to all digits.  No cyanosis or clubbing noted. Pedal hair growth normal.  Neurologic Normal speech. Oriented to  person, place, and time. Epicritic sensation to light touch grossly present bilaterally.  Dermatologic Dark nonraised papules/lesion noted to the left lateral foot.  No pain on palpation well-circumscribed no asymmetry noted no discoloration noted.  No melanoma signs noted.  Orthopedic: Normal joint ROM without pain or crepitus bilaterally. No visible deformities. No bony tenderness.   Radiographs: None Assessment:   1. Tinea pedis of both feet   2. Dermatitis   3. Benign skin lesion    Plan:  Patient was evaluated and treated and all questions answered.  Left lateral benign skin lesion foot/dermatitis -All questions and concerns were discussed with the patient in extensive detail.  Given the nature of these type lesions believe patient would benefit from biopsy of the lesion.  Patient agrees with plan to proceed with this.  Will treat accordingly.  It appears to be a very lesion without any characteristics of cancer -Excision of the skin lesion Skin was prepped in standard technique with Betadine.  One-to-one mixture of 1% lidocaine plain half percent Marcaine plain was injected circumferentially in V-block fashion 3 cc.  3 mm punch biopsy was utilized to remove the lesion in its entirety down to the level of the subcutaneous tissue.  The skin lesion was sent to pathology to be examined.  The wound site was dressed with triple antibiotic 4 x 4 gauze Kerlix and Ace bandage.  I have asked the patient to keep it covered with Neosporin and a Band-Aid until healing.  I will see her back again in3 weeks  No follow-ups on file.

## 2022-06-22 ENCOUNTER — Telehealth: Payer: Self-pay | Admitting: *Deleted

## 2022-06-22 NOTE — Telephone Encounter (Signed)
Ok, thanks, will let her know.called Claudia Brady(Claudia Brady), to give information, no answer, left voicemessage and to call back if further questions.

## 2022-06-22 NOTE — Telephone Encounter (Addendum)
[facility] is needing clarification on a specimen sent , only skin was received but requisition sheet was marked skin and nail, please advise.

## 2022-07-15 ENCOUNTER — Ambulatory Visit: Payer: Medicare Other | Admitting: Podiatry

## 2022-07-23 ENCOUNTER — Ambulatory Visit (INDEPENDENT_AMBULATORY_CARE_PROVIDER_SITE_OTHER): Payer: Medicare Other | Admitting: Podiatry

## 2022-07-23 DIAGNOSIS — B353 Tinea pedis: Secondary | ICD-10-CM | POA: Diagnosis not present

## 2022-07-23 DIAGNOSIS — L0889 Other specified local infections of the skin and subcutaneous tissue: Secondary | ICD-10-CM

## 2022-07-23 MED ORDER — CLOBETASOL PROPIONATE 0.05 % EX CREA: 1.0000 | TOPICAL_CREAM | Freq: Two times a day (BID) | CUTANEOUS | 3 refills | Status: AC

## 2022-07-23 NOTE — Progress Notes (Signed)
Subjective:  Patient ID: Claudia Brady, female    DOB: 07-13-1961,  MRN: [id number]  Chief Complaint  Patient presents with   Tinea Pedis    Pt stated that things are about the same     62 y.o. female presents with the above complaint.  P patient presents with follow-up of multiple skin lesions.  Patient states that they are about the same.  She is here to go over the results and discuss options   Review of Systems: Negative except as noted in the HPI. Denies N/V/F/Ch.  Past Medical History:  Diagnosis Date   Abnormal uterine bleeding (AUB) 02/04/2018   ATRIAL FIBRILLATION 08/20/2010   Qualifier: History of  By: Burt Knack CMA, Lori     Chronic pain    Depression 11/28/2010   Duodenal ulcer Jan. 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 She endorsed symptoms of fevers, chills, coughing with sputum production, chest pain and dyspnea. She stated she tried tylenol, mucinex, and home remedies for her symptoms. Currently her only symptoms were dry cough and fatigue. She reported good recovery since the onset of her symptoms. Thorough hx of her symptoms and timeli   Migraine 09/17/2018   OSA (obstructive sleep apnea)    Patellofemoral arthralgia of both knees 03/25/2020   Plantar fascia syndrome    RESTLESS LEG SYNDROME 08/20/2010        Rhinitis    Shoulder injury 01/31/2020   Varicose vein of leg    Venous insufficiency 09/16/2018   Previously referred for ECHO due to swelling in her legs, dyspnea on exertion, orthopnea, and fast weight gain. Echo in November 2019 showed EF of 29-51% and no diastolic dysfunction. Her symptoms of LE swelling may be due to venous insufficiency and obesity. She also has a  history of atrial fibrillation per chart review but EKGs in system do not show this and it may have been paroxysmal leading t    Current Outpatient Medications:    clobetasol cream (TEMOVATE) 8.84 %, Apply 1 Application topically 2 (two) times daily., Disp: 30 g, Rfl: 3   Bioflavonoid Products (VITAMIN C) CHEW, Chew by mouth., Disp: , Rfl:    BIOTIN PO, Take by mouth., Disp: , Rfl:    Cholecalciferol (VITAMIN D) 50 MCG (2000 UT) CAPS, Take by mouth., Disp: , Rfl:    clotrimazole-betamethasone (LOTRISONE) cream, Apply 1 Application topically 2 (two) times daily., Disp: 30 g, Rfl: 0   cyclobenzaprine (FLEXERIL) 10 MG tablet, Take 1 tablet (10 mg total) by mouth 2 (two) times daily as needed for muscle spasms., Disp: 20 tablet, Rfl: 0   doravirin-lamivudin-tenofov df (DELSTRIGO) 100-300-300 MG TABS per tablet, Take 1 tablet by mouth daily., Disp: 30 tablet, Rfl: 11   labetalol (NORMODYNE) 100 MG tablet, TAKE 1 TABLET(100 MG) BY MOUTH TWICE DAILY, Disp: 180 tablet, Rfl: 1   losartan (COZAAR) 100 MG tablet, Take 1 tablet (100 mg total) by mouth daily., Disp: 90 tablet, Rfl: 3  Social History   Tobacco Use  Smoking Status Former   Years: 15.00   Types: Cigarettes   Quit date: 12/22/2015   Years since quitting: 6.6  Smokeless Tobacco Never    Allergies  Allergen Reactions   Ace Inhibitors Cough   Other Rash    Patient states she is allergic to sanitary napkins.   Penicillins Swelling and Rash    States she has taken amoxicillin without difficulty.    Objective:  There were no vitals filed for this visit. There is no height or weight on file to calculate BMI. Constitutional Well developed. Well nourished.  Vascular Dorsalis pedis pulses palpable bilaterally. Posterior tibial pulses palpable bilaterally. Capillary refill normal to all digits.  No cyanosis or clubbing noted. Pedal hair growth normal.  Neurologic Normal speech. Oriented to person, place, and time. Epicritic sensation  to light touch grossly present bilaterally.  Dermatologic Dark nonraised papules/lesion noted to the left lateral foot.  No pain on palpation well-circumscribed no asymmetry noted no discoloration noted.  No melanoma signs noted.  Orthopedic: Normal joint ROM without pain or crepitus bilaterally. No visible deformities. No bony tenderness.   Radiographs: None Assessment:   No diagnosis found.  Plan:  Patient was evaluated and treated and all questions answered.  Left lateral benign skin lesion foot/dermatitis -All questions and concerns were discussed with the patient in extensive detail.  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  At this time I believe patient will benefit from high potency steroid clobetasol was sent to the pharmacy.  I have asked her to apply twice a day and if there is no improvement we will discuss further treatment options.  She states understanding.  No follow-ups on file.

## 2022-10-17 ENCOUNTER — Other Ambulatory Visit: Payer: Self-pay | Admitting: Internal Medicine

## 2022-10-17 DIAGNOSIS — B20 Human immunodeficiency virus [HIV] disease: Secondary | ICD-10-CM

## 2022-10-20 ENCOUNTER — Other Ambulatory Visit (HOSPITAL_COMMUNITY)
Admission: RE | Admit: 2022-10-20 | Discharge: 2022-10-20 | Disposition: A | Discharge status: Home / Self Care | Payer: 59 | Source: Ambulatory Visit | Attending: Internal Medicine | Admitting: Internal Medicine

## 2022-10-20 ENCOUNTER — Other Ambulatory Visit: Payer: Self-pay

## 2022-10-20 ENCOUNTER — Other Ambulatory Visit: Payer: 59

## 2022-10-20 DIAGNOSIS — Z113 Encounter for screening for infections with a predominantly sexual mode of transmission: Secondary | ICD-10-CM | POA: Diagnosis present

## 2022-10-20 DIAGNOSIS — Z79899 Other long term (current) drug therapy: Secondary | ICD-10-CM

## 2022-10-20 DIAGNOSIS — B20 Human immunodeficiency virus [HIV] disease: Secondary | ICD-10-CM

## 2022-10-21 LAB — T-HELPER CELL (CD4) - (RCID CLINIC ONLY)
CD4 % Helper T Cell: 50 % (ref 33–65)
CD4 T Cell Abs: 728 /uL (ref 400–1790)

## 2022-10-21 LAB — URINE CYTOLOGY ANCILLARY ONLY
Chlamydia: NEGATIVE
Comment: NEGATIVE
Comment: NORMAL
Neisseria Gonorrhea: NEGATIVE

## 2022-10-23 LAB — HIV-1 RNA QUANT-NO REFLEX-BLD
HIV 1 RNA Quant: <20 Copies/mL — ABNORMAL HIGH
HIV-1 RNA Quant, Log: <1.3 Log cps/mL — ABNORMAL HIGH

## 2022-10-23 LAB — COMPLETE METABOLIC PANEL WITH GFR
AG Ratio: 1.1 (calc) (ref 1.0–2.5)
ALT: 11 U/L (ref 6–29)
AST: 15 U/L (ref 10–35)
Albumin: 3.8 g/dL (ref 3.6–5.1)
Alkaline phosphatase (APISO): 96 U/L (ref 37–153)
BUN: 11 mg/dL (ref 7–25)
CO2: 32 mmol/L (ref 20–32)
Calcium: 8.9 mg/dL (ref 8.6–10.4)
Chloride: 99 mmol/L (ref 98–110)
Creat: 0.82 mg/dL (ref 0.50–1.05)
Globulin: 3.5 g/dL (calc) (ref 1.9–3.7)
Glucose, Bld: 84 mg/dL (ref 65–99)
Potassium: 4.1 mmol/L (ref 3.5–5.3)
Sodium: 138 mmol/L (ref 135–146)
Total Bilirubin: 0.4 mg/dL (ref 0.2–1.2)
Total Protein: 7.3 g/dL (ref 6.1–8.1)
eGFR: 81 mL/min/{1.73_m2} (ref >=60)

## 2022-10-23 LAB — CBC WITH DIFFERENTIAL/PLATELET
Absolute Monocytes: 451 cells/uL (ref 200–950)
Basophils Absolute: 22 cells/uL (ref 0–200)
Basophils Relative: 0.4 %
Eosinophils Absolute: 132 cells/uL (ref 15–500)
Eosinophils Relative: 2.4 %
HCT: 37.7 % (ref 35.0–45.0)
Hemoglobin: 12.2 g/dL (ref 11.7–15.5)
Lymphs Abs: 1645 cells/uL (ref 850–3900)
MCH: 28.6 pg (ref 27.0–33.0)
MCHC: 32.4 g/dL (ref 32.0–36.0)
MCV: 88.3 fL (ref 80.0–100.0)
MPV: 10.5 fL (ref 7.5–12.5)
Monocytes Relative: 8.2 %
Neutro Abs: 3251 cells/uL (ref 1500–7800)
Neutrophils Relative %: 59.1 %
Platelets: 338 10*3/uL (ref 140–400)
RBC: 4.27 10*6/uL (ref 3.80–5.10)
RDW: 15.2 % — ABNORMAL HIGH (ref 11.0–15.0)
Total Lymphocyte: 29.9 %
WBC: 5.5 10*3/uL (ref 3.8–10.8)

## 2022-10-23 LAB — LIPID PANEL
Cholesterol: 143 mg/dL (ref <200)
HDL: 49 mg/dL — ABNORMAL LOW (ref >=50)
LDL Cholesterol (Calc): 73 mg/dL (calc)
Non-HDL Cholesterol (Calc): 94 mg/dL (calc) (ref <130)
Total CHOL/HDL Ratio: 2.9 (calc) (ref <5.0)
Triglycerides: 131 mg/dL (ref <150)

## 2022-10-23 LAB — RPR: RPR Ser Ql: NONREACTIVE

## 2022-10-26 ENCOUNTER — Ambulatory Visit (INDEPENDENT_AMBULATORY_CARE_PROVIDER_SITE_OTHER): Payer: 59

## 2022-10-26 DIAGNOSIS — J069 Acute upper respiratory infection, unspecified: Secondary | ICD-10-CM | POA: Diagnosis not present

## 2022-10-26 NOTE — Progress Notes (Signed)
[facility] Telephone Encounter Continuity Care Appointment  HPI:  This telephone encounter was created for Ms. Robynne Spohn with past medical history as below on 10/26/2022 for the following purpose/cc: cold symptoms. See detailed assessment and plan for HPI.   Past Medical History:  Past Medical History:  Diagnosis Date   Abnormal uterine bleeding (AUB) 02/04/2018   ATRIAL FIBRILLATION 08/20/2010   Qualifier: History of  By: Excell Seltzer CMA, Lori     Chronic pain    Depression 11/28/2010   Duodenal ulcer Jan. 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 She endorsed symptoms of fevers, chills, coughing with sputum production, chest pain and dyspnea. She stated she tried tylenol, mucinex, and home remedies for her symptoms. Currently her only symptoms were dry cough and fatigue. She reported good recovery since the onset of her symptoms. Thorough hx of her symptoms and timeli   Migraine 09/17/2018   OSA (obstructive sleep apnea)    Patellofemoral arthralgia of both knees 03/25/2020   Plantar fascia syndrome    RESTLESS LEG SYNDROME 08/20/2010        Rhinitis    Shoulder injury 01/31/2020   Varicose vein of leg    Venous insufficiency 09/16/2018   Previously referred for ECHO due to swelling in her legs, dyspnea on exertion, orthopnea, and fast weight gain. Echo in November 2019 showed EF of 55-60% and no diastolic dysfunction. Her symptoms of LE swelling may be due to venous insufficiency and obesity. She also has a history of atrial fibrillation per chart review but EKGs in system do not show this and it may have been paroxysmal leading t     ROS:  See detailed assessment and  plan for pertinent ROS.   Assessment / Plan / Recommendations:  Please see A&P under problem oriented charting for assessment of the patient's acute and chronic medical conditions.  As always, pt is advised that if symptoms worsen or new symptoms arise, they should go to an urgent care facility or to to ER for further evaluation.   Consent and Medical Decision Making:  Patient discussed with Dr. Mayford Knife This is a telephone encounter between Zalynn Sizemore and Adron Bene on 10/26/2022 for cold symptoms. The visit was conducted with the patient located at home and Adron Bene at [facility]. The patient's identity was confirmed using their DOB and current address. The patient has consented to being evaluated through a telephone encounter and understands the associated risks (an examination cannot be done and the patient may need to come in for an appointment) / benefits (allows the patient to remain at home, decreasing exposure to coronavirus). I personally spent 20 minutes on medical discussion.  URI (upper respiratory infection) Discussed patient's symptoms over the phone.  She reports that her symptoms started Wednesday with congestion, fatigue, feeling cold, weakness, decreased appetite, raspy voice, sore throat, chills, and bodyaches.  Additionally endorses green-colored mucus that she feels is coming from her chest. No history of covid vacc but endorses flu vaccine. Taking DayQuil, nasal decongestant. Today has been the worst. Shortness of breath when she walks but this has not worsened since onset.  She reports that she has just been staying  in her home since symptoms began due to fatigue.  Patient has HIV with healthy CD4 count and low viral load as of last week. -Continue supportive care -Discussed return precautions which included calling clinic if symptoms have not improved by Wednesday of this week or going to urgent care if symptoms worsen by then

## 2022-10-26 NOTE — Assessment & Plan Note (Addendum)
Discussed patient's symptoms over the phone.  She reports that her symptoms started Wednesday with congestion, fatigue, feeling cold, weakness, decreased appetite, raspy voice, sore throat, chills, and bodyaches.  Additionally endorses green-colored mucus that she feels is coming from her chest. No history of covid vacc but endorses flu vaccine. Taking DayQuil, nasal decongestant. Today has been the worst. Shortness of breath when she walks but this has not worsened since onset.  She reports that she has just been staying in her home since symptoms began due to fatigue.  Patient has HIV with healthy CD4 count and low viral load as of last week. -Continue supportive care -Discussed return precautions which included calling clinic if symptoms have not improved by Wednesday of this week or going to urgent care if symptoms worsen by then

## 2022-10-28 ENCOUNTER — Ambulatory Visit (INDEPENDENT_AMBULATORY_CARE_PROVIDER_SITE_OTHER): Payer: 59 | Admitting: Podiatry

## 2022-10-28 DIAGNOSIS — L309 Dermatitis, unspecified: Secondary | ICD-10-CM | POA: Diagnosis not present

## 2022-10-28 DIAGNOSIS — B353 Tinea pedis: Secondary | ICD-10-CM

## 2022-10-28 NOTE — Progress Notes (Unsigned)
Subjective:  Patient ID: Claudia Brady, female    DOB: 06-Apr-1961,  MRN: [id number]  Chief Complaint  Patient presents with   Tinea Pedis    62 y.o. female presents with the above complaint.  P patient presents with follow-up of multiple skin lesions.  Patient states that they are about the same.  She is here to go over the results and discuss options   Review of Systems: Negative except as noted in the HPI. Denies N/V/F/Ch.  Past Medical History:  Diagnosis Date   Abnormal uterine bleeding (AUB) 02/04/2018   ATRIAL FIBRILLATION 08/20/2010   Qualifier: History of  By: Excell Seltzer CMA, Lori     Chronic pain    Depression 11/28/2010   Duodenal ulcer Jan. 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 She endorsed symptoms of fevers, chills, coughing with sputum production, chest pain and dyspnea. She stated she tried tylenol, mucinex, and home remedies for her symptoms. Currently her only symptoms were dry cough and fatigue. She reported good recovery since the onset of her symptoms. Thorough hx of her symptoms and timeli   Migraine 09/17/2018   OSA (obstructive sleep apnea)    Patellofemoral arthralgia of both knees 03/25/2020   Plantar fascia syndrome    RESTLESS LEG SYNDROME 08/20/2010        Rhinitis    Shoulder injury 01/31/2020   Varicose vein of leg    Venous insufficiency 09/16/2018   Previously referred for ECHO due to swelling in her legs, dyspnea on exertion, orthopnea, and fast weight gain. Echo in November 2019 showed EF of 55-60% and no diastolic dysfunction. Her symptoms of LE swelling may be due to venous insufficiency and obesity. She also has a history of atrial fibrillation per chart review  but EKGs in system do not show this and it may have been paroxysmal leading t    Current Outpatient Medications:    Bioflavonoid Products (VITAMIN C) CHEW, Chew by mouth., Disp: , Rfl:    BIOTIN PO, Take by mouth., Disp: , Rfl:    Cholecalciferol (VITAMIN D) 50 MCG (2000 UT) CAPS, Take by mouth., Disp: , Rfl:    clobetasol cream (TEMOVATE) 0.05 %, Apply 1 Application topically 2 (two) times daily., Disp: 30 g, Rfl: 3   clotrimazole-betamethasone (LOTRISONE) cream, Apply 1 Application topically 2 (two) times daily., Disp: 30 g, Rfl: 0   cyclobenzaprine (FLEXERIL) 10 MG tablet, Take 1 tablet (10 mg total) by mouth 2 (two) times daily as needed for muscle spasms., Disp: 20 tablet, Rfl: 0   doravirin-lamivudin-tenofov df (DELSTRIGO) 100-300-300 MG TABS per tablet, TAKE 1 TABLET BY MOUTH DAILY, Disp: 30 tablet, Rfl: 0   labetalol (NORMODYNE) 100 MG tablet, TAKE 1 TABLET(100 MG) BY MOUTH TWICE DAILY, Disp: 180 tablet, Rfl: 1   losartan (COZAAR) 100 MG tablet, Take 1 tablet (100 mg total) by mouth daily., Disp: 90 tablet, Rfl: 3  Social History   Tobacco Use  Smoking Status Former   Years: 15   Types: Cigarettes   Quit date: 12/22/2015   Years since quitting: 6.8  Smokeless Tobacco Never    Allergies  Allergen Reactions   Ace Inhibitors Cough   Other Rash  Patient states she is allergic to sanitary napkins.   Penicillins Swelling and Rash    States she has taken amoxicillin without difficulty.    Objective:  There were no vitals filed for this visit. There is no height or weight on file to calculate BMI. Constitutional Well developed. Well nourished.  Vascular Dorsalis pedis pulses palpable bilaterally. Posterior tibial pulses palpable bilaterally. Capillary refill normal to all digits.  No cyanosis or clubbing noted. Pedal hair growth normal.  Neurologic Normal speech. Oriented to person, place, and time. Epicritic sensation to light touch grossly present bilaterally.   Dermatologic Dark nonraised papules/lesion noted to the left lateral foot.  No pain on palpation well-circumscribed no asymmetry noted no discoloration noted.  No melanoma signs noted.  Orthopedic: Normal joint ROM without pain or crepitus bilaterally. No visible deformities. No bony tenderness.   Radiographs: None Assessment:   1. Dermatitis   2. Tinea pedis of both feet     Plan:  Patient was evaluated and treated and all questions answered.  Left lateral benign skin lesion foot/dermatitis -All questions and concerns were discussed with the patient in extensive detail.  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  -Continue using clobetasol -She will benefit from dermatology referral.  Dermatology referral was placed -The skin rash is spreading spreading to other parts of her body.  No follow-ups on file.

## 2022-10-30 NOTE — Progress Notes (Signed)
Internal Medicine Clinic Attending  Case discussed with Dr. Cliffton Asters  At the time of the visit.  We reviewed the resident's history and exam and pertinent patient test results.  I agree with the assessment, diagnosis, and plan of care documented in the resident's note. Her symptoms are likely peaking and she currently does not need an in person assessment, though Dr. Cliffton Asters has provided appropriate instructions for what to do if she worsens.

## 2022-11-02 ENCOUNTER — Ambulatory Visit: Payer: 59 | Admitting: Dermatology

## 2022-11-03 ENCOUNTER — Ambulatory Visit (INDEPENDENT_AMBULATORY_CARE_PROVIDER_SITE_OTHER): Payer: 59 | Admitting: Internal Medicine

## 2022-11-03 ENCOUNTER — Other Ambulatory Visit: Payer: Self-pay

## 2022-11-03 VITALS — BP 149/79 | HR 91 | Temp 97.4°F | Wt 384.0 lb

## 2022-11-03 DIAGNOSIS — B20 Human immunodeficiency virus [HIV] disease: Secondary | ICD-10-CM

## 2022-11-03 DIAGNOSIS — J069 Acute upper respiratory infection, unspecified: Secondary | ICD-10-CM

## 2022-11-03 DIAGNOSIS — Z79899 Other long term (current) drug therapy: Secondary | ICD-10-CM

## 2022-11-03 DIAGNOSIS — I10 Essential (primary) hypertension: Secondary | ICD-10-CM

## 2022-11-03 MED ORDER — DELSTRIGO 100-300-300 MG PO TABS: 1.0000 | ORAL_TABLET | Freq: Every day | ORAL | 11 refills | Status: DC

## 2022-11-03 MED ORDER — METHOCARBAMOL 500 MG PO TABS: 500.0000 mg | ORAL_TABLET | Freq: Three times a day (TID) | ORAL | 0 refills | Status: DC | PRN

## 2022-11-03 NOTE — Progress Notes (Signed)
Patient ID: Claudia Brady, female   DOB: 02/14/1961, 62 y.o.   MRN: [id number]  HPI  Claudia Brady is a 62yo F with Well controlled hiv disease, currently on delstrigo, doing well with adherence. She reports still having productive cough and Symptoms concerning for covid in the last 6 days. She did not use any self ag test. Outpatient Encounter Medications as of 11/03/2022  Medication Sig   Bioflavonoid Products (VITAMIN C) CHEW Chew by mouth.   Cholecalciferol (VITAMIN D) 50 MCG (2000 UT) CAPS Take by mouth.   clobetasol cream (TEMOVATE) 0.05 % Apply 1 Application topically 2 (two) times daily.   clotrimazole-betamethasone (LOTRISONE) cream Apply 1 Application topically 2 (two) times daily.   cyclobenzaprine (FLEXERIL) 10 MG tablet Take 1 tablet (10 mg total) by mouth 2 (two) times daily as needed for muscle spasms.   doravirin-lamivudin-tenofov df (DELSTRIGO) 100-300-300 MG TABS per tablet TAKE 1 TABLET BY MOUTH DAILY   labetalol (NORMODYNE) 100 MG tablet TAKE 1 TABLET(100 MG) BY MOUTH TWICE DAILY   losartan (COZAAR) 100 MG tablet Take 1 tablet (100 mg total) by mouth daily.   BIOTIN PO Take by mouth.   No facility-administered encounter medications on file as of 11/03/2022.     Patient Active Problem List   Diagnosis Date Noted   URI (upper respiratory infection) 10/26/2022   Bilateral foot pain 03/29/2022   Hemorrhoid 04/05/2019   Healthcare maintenance 04/05/2019   Morbid obesity 04/19/2017   HTN (hypertension) 10/23/2016   Human immunodeficiency virus I infection 10/02/2016   Poor dentition 10/02/2016   Obstructive sleep apnea 08/20/2010     Health Maintenance Due  Topic Date Due   Zoster Vaccines- Shingrix (1 of 2) Never done   PAP SMEAR-Modifier  02/05/2019   MAMMOGRAM  05/21/2019   Medicare Annual Wellness (AWV)  11/04/2021   COVID-19 Vaccine (4 - 2023-24 season) 03/13/2022     Review of Systems +fatigue, +cough, 12 point ros is otherwise negative Physical  Exam   BP (!) 157/90   Pulse 91   Temp (!) 97.4 F (36.3 C) (Oral)   Wt (!) 384 lb (174.2 kg)   SpO2 95%   BMI 63.12 kg/m   Physical Exam  Constitutional:  oriented to person, place, and time. appears well-developed and well-nourished. No distress.  HENT: [place]/AT, PERRLA, no scleral icterus Mouth/Throat: Oropharynx is clear and moist. No oropharyngeal exudate.  Cardiovascular: Normal rate, regular rhythm and normal heart sounds. Exam reveals no gallop and no friction rub.  No murmur heard.  Pulmonary/Chest: Effort normal and breath sounds normal. No respiratory distress.  has no wheezes.  Neck = supple, no nuchal rigidity Abdominal: Soft. Bowel sounds are normal.  exhibits no distension. There is no tenderness.  Lymphadenopathy: no cervical adenopathy. No axillary adenopathy Neurological: alert and oriented to person, place, and time.  Skin: Skin is warm and dry. No rash noted. No erythema.  Psychiatric: a normal mood and affect.  behavior is normal.   Lab Results  Component Value Date   CD4TCELL 50 10/20/2022   Lab Results  Component Value Date   CD4TABS 728 10/20/2022   CD4TABS 860 07/16/2021   CD4TABS 810 08/15/2020   Lab Results  Component Value Date   HIV1RNAQUANT <20 (H) 10/20/2022   Lab Results  Component Value Date   HEPBSAB NEG 10/02/2016   Lab Results  Component Value Date   LABRPR NON-REACTIVE 10/20/2022    CBC Lab Results  Component Value Date   WBC 5.5 10/20/2022  RBC 4.27 10/20/2022   HGB 12.2 10/20/2022   HCT 37.7 10/20/2022   PLT 338 10/20/2022   MCV 88.3 10/20/2022   MCH 28.6 10/20/2022   MCHC 32.4 10/20/2022   RDW 15.2 (H) 10/20/2022   LYMPHSABS 1,645 10/20/2022   MONOABS 0.5 03/17/2014   EOSABS 132 10/20/2022    BMET Lab Results  Component Value Date   NA 138 10/20/2022   K 4.1 10/20/2022   CL 99 10/20/2022   CO2 32 10/20/2022   GLUCOSE 84 10/20/2022   BUN 11 10/20/2022   CREATININE 0.82 10/20/2022   CALCIUM 8.9 10/20/2022    GFRNONAA 86 08/15/2020   GFRAA 100 08/15/2020      Assessment and Plan Respiratory symptoms/uri = could be covid but unable to test in clinic. She is out of the window for use for utility of paxlovid. Plan to recommend to wear mask for 10 days since onset of symptoms. Continue with supportive care. If fevers develop, re evaluate with pcp.  Htn = above goal. Repeat BP still elevated, recommend to see PCP for evalution. She did not take meds this morning due to some nausea and vomiting  Hiv disease= reviewing labs shows she is well controlled.  Will give refills  Long term medication management= cr stable See back in 3 months

## 2022-11-04 ENCOUNTER — Ambulatory Visit (INDEPENDENT_AMBULATORY_CARE_PROVIDER_SITE_OTHER): Payer: 59 | Admitting: Internal Medicine

## 2022-11-04 ENCOUNTER — Ambulatory Visit (HOSPITAL_COMMUNITY)
Admission: RE | Admit: 2022-11-04 | Discharge: 2022-11-04 | Disposition: A | Discharge status: Home / Self Care | Payer: 59 | Source: Ambulatory Visit | Attending: Internal Medicine | Admitting: Internal Medicine

## 2022-11-04 ENCOUNTER — Other Ambulatory Visit: Payer: Self-pay | Admitting: Internal Medicine

## 2022-11-04 ENCOUNTER — Other Ambulatory Visit: Payer: Self-pay

## 2022-11-04 VITALS — BP 147/101 | HR 86 | Temp 98.3°F | Ht 65.4 in | Wt 380.0 lb

## 2022-11-04 DIAGNOSIS — R1013 Epigastric pain: Secondary | ICD-10-CM | POA: Insufficient documentation

## 2022-11-04 DIAGNOSIS — R109 Unspecified abdominal pain: Secondary | ICD-10-CM

## 2022-11-04 DIAGNOSIS — G4733 Obstructive sleep apnea (adult) (pediatric): Secondary | ICD-10-CM | POA: Diagnosis not present

## 2022-11-04 DIAGNOSIS — Z1231 Encounter for screening mammogram for malignant neoplasm of breast: Secondary | ICD-10-CM

## 2022-11-04 DIAGNOSIS — Z6841 Body Mass Index (BMI) 40.0 and over, adult: Secondary | ICD-10-CM

## 2022-11-04 DIAGNOSIS — I10 Essential (primary) hypertension: Secondary | ICD-10-CM | POA: Diagnosis not present

## 2022-11-04 DIAGNOSIS — K5900 Constipation, unspecified: Secondary | ICD-10-CM

## 2022-11-04 DIAGNOSIS — J069 Acute upper respiratory infection, unspecified: Secondary | ICD-10-CM

## 2022-11-04 DIAGNOSIS — Z0000 Encounter for general adult medical examination without abnormal findings: Secondary | ICD-10-CM

## 2022-11-04 LAB — LIPASE, BLOOD: Lipase: 50 U/L (ref 11–51)

## 2022-11-04 LAB — COMPREHENSIVE METABOLIC PANEL
ALT: 16 U/L (ref 0–44)
AST: 18 U/L (ref 15–41)
Albumin: 3.3 g/dL — ABNORMAL LOW (ref 3.5–5.0)
Alkaline Phosphatase: 90 U/L (ref 38–126)
Anion gap: 10 (ref 5–15)
BUN: 7 mg/dL — ABNORMAL LOW (ref 8–23)
CO2: 28 mmol/L (ref 22–32)
Calcium: 8.9 mg/dL (ref 8.9–10.3)
Chloride: 95 mmol/L — ABNORMAL LOW (ref 98–111)
Creatinine, Ser: 0.79 mg/dL (ref 0.44–1.00)
GFR, Estimated: >60 mL/min (ref >60)
Glucose, Bld: 90 mg/dL (ref 70–99)
Potassium: 3.8 mmol/L (ref 3.5–5.1)
Sodium: 133 mmol/L — ABNORMAL LOW (ref 135–145)
Total Bilirubin: 0.9 mg/dL (ref 0.3–1.2)
Total Protein: 8.1 g/dL (ref 6.5–8.1)

## 2022-11-04 LAB — CBC WITH DIFFERENTIAL/PLATELET
Abs Immature Granulocytes: 0.12 10*3/uL — ABNORMAL HIGH (ref 0.00–0.07)
Basophils Absolute: 0 10*3/uL (ref 0.0–0.1)
Basophils Relative: 0 %
Eosinophils Absolute: 0 10*3/uL (ref 0.0–0.5)
Eosinophils Relative: 0 %
HCT: 39.7 % (ref 36.0–46.0)
Hemoglobin: 12.7 g/dL (ref 12.0–15.0)
Immature Granulocytes: 1 %
Lymphocytes Relative: 13 %
Lymphs Abs: 1.5 10*3/uL (ref 0.7–4.0)
MCH: 28.9 pg (ref 26.0–34.0)
MCHC: 32 g/dL (ref 30.0–36.0)
MCV: 90.4 fL (ref 80.0–100.0)
Monocytes Absolute: 0.9 10*3/uL (ref 0.1–1.0)
Monocytes Relative: 8 %
Neutro Abs: 8.6 10*3/uL — ABNORMAL HIGH (ref 1.7–7.7)
Neutrophils Relative %: 78 %
Platelets: 324 10*3/uL (ref 150–400)
RBC: 4.39 MIL/uL (ref 3.87–5.11)
RDW: 16 % — ABNORMAL HIGH (ref 11.5–15.5)
WBC: 11 10*3/uL — ABNORMAL HIGH (ref 4.0–10.5)
nRBC: 0 % (ref 0.0–0.2)

## 2022-11-04 MED ORDER — PANTOPRAZOLE SODIUM 40 MG PO TBEC: 40.0000 mg | DELAYED_RELEASE_TABLET | Freq: Two times a day (BID) | ORAL | 1 refills | Status: DC

## 2022-11-04 MED ORDER — SENNOSIDES-DOCUSATE SODIUM 8.6-50 MG PO TABS: 2.0000 | ORAL_TABLET | ORAL | 0 refills | Status: DC

## 2022-11-04 NOTE — Patient Instructions (Addendum)
Claudia Brady, it was a pleasure seeing you today! You endorsed feeling well today. Below are some of the things we talked about this visit. We look forward to seeing you in the follow up appointment!  Today we discussed: For your abdominal pain, we will get some lab work and imaging done.  Continuing using heat as you state that is helping you.  If you are not able to tolerate fluid or have repeat vomiting then go to the emergency department. If the pain gets severe, then go the the emergency department for evaluation.    I have ordered the following labs today:  Lab Orders         CBC with Diff         CMP w Anion Gap (STAT/Sunquest-performed on-site)         Lipase, blood (STAT)       Referrals ordered today:   Referral Orders  No referral(s) requested today     I have ordered the following medication/changed the following medications:   Stop the following medications: There are no discontinued medications.   Start the following medications: Meds ordered this encounter  Medications   pantoprazole (PROTONIX) 40 MG tablet    Sig: Take 1 tablet (40 mg total) by mouth 2 (two) times daily.    Dispense:  60 tablet    Refill:  1   senna-docusate (SENOKOT-S) 8.6-50 MG tablet    Sig: Take 2 tablets by mouth See admin instructions for 7 days. Take two tablets twice daily until you have a bowel movement and then take 2 tablets at night after that.    Dispense:  28 tablet    Refill:  0     Follow-up: 1 week follow up   Please make sure to arrive 15 minutes prior to your next appointment. If you arrive late, you may be asked to reschedule.   We look forward to seeing you next time. Please call our clinic at [phone] if you have any questions or concerns. The best time to call is Monday-Friday from 9am-4pm, but there is someone available 24/7. If after hours or the weekend, call the main hospital number and ask for the Internal Medicine Resident On-Call. If you need  medication refills, please notify your pharmacy one week in advance and they will send [place] a request.  Thank you for letting us take part in your care. Wishing you the best!  Thank you, Gwenevere Abbot, MD

## 2022-11-04 NOTE — Progress Notes (Unsigned)
CC: PCP follow up   HPI:  [name] is a 62 y.o. with medical history of HTN, HIV, OSA and GERD presenting to [facility] for PCP follow up.  Please see problem-based list for further details, assessments, and plans.  Past Medical History:  Diagnosis Date   Abnormal uterine bleeding (AUB) 02/04/2018   ATRIAL FIBRILLATION 08/20/2010   Qualifier: History of  By: Excell Seltzer CMA, Lori     Chronic pain    Depression 11/28/2010   Duodenal ulcer Jan. 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 She endorsed symptoms of fevers, chills, coughing with sputum production, chest pain and dyspnea. She stated she tried tylenol, mucinex, and home remedies for her symptoms. Currently her only symptoms were dry cough and fatigue. She reported good recovery since the onset of her symptoms. Thorough hx of her symptoms and timeli   Migraine 09/17/2018   OSA (obstructive sleep apnea)    Patellofemoral arthralgia of both knees 03/25/2020   Plantar fascia syndrome    RESTLESS LEG SYNDROME 08/20/2010        Rhinitis    Shoulder injury 01/31/2020   Varicose vein of leg    Venous insufficiency 09/16/2018   Previously referred for ECHO due to swelling in her legs, dyspnea on exertion, orthopnea, and fast weight gain. Echo in November 2019 showed EF of 55-60% and no diastolic dysfunction. Her symptoms of LE swelling may be due to venous insufficiency and obesity. She also has a history of atrial fibrillation per chart review but EKGs in system do not show this and it may have been paroxysmal leading t     Current Outpatient Medications (Cardiovascular):    labetalol (NORMODYNE) 100 MG tablet, TAKE 1 TABLET(100 MG) BY MOUTH TWICE  DAILY   losartan (COZAAR) 100 MG tablet, Take 1 tablet (100 mg total) by mouth daily.     Current Outpatient Medications (Other):    pantoprazole (PROTONIX) 40 MG tablet, Take 1 tablet (40 mg total) by mouth 2 (two) times daily.   senna-docusate (SENOKOT-S) 8.6-50 MG tablet, Take 2 tablets by mouth See admin instructions for 7 days. Take two tablets twice daily until you have a bowel movement and then take 2 tablets at night after that.   Bioflavonoid Products (VITAMIN C) CHEW, Chew by mouth.   BIOTIN PO, Take by mouth.   Cholecalciferol (VITAMIN D) 50 MCG (2000 UT) CAPS, Take by mouth.   clobetasol cream (TEMOVATE) 0.05 %, Apply 1 Application topically 2 (two) times daily.   clotrimazole-betamethasone (LOTRISONE) cream, Apply 1 Application topically 2 (two) times daily.   cyclobenzaprine (FLEXERIL) 10 MG tablet, Take 1 tablet (10 mg total) by mouth 2 (two) times daily as needed for muscle spasms.   doravirin-lamivudin-tenofov df (DELSTRIGO) 100-300-300 MG TABS per tablet, Take 1 tablet by mouth daily.   methocarbamol (ROBAXIN) 500 MG tablet, Take 1 tablet (500 mg total) by mouth every 8 (eight) hours as needed for muscle spasms.  Review of Systems:  Review of system negative unless stated in the problem list or HPI.    Physical Exam:  Vitals:   11/04/22 1353 11/04/22 1357  BP:  (!) 147/101  Pulse:  86  Temp:  98.3 F (36.8 C)  TempSrc:  Oral  SpO2:  97%  Weight: (!) 385 lb (174.6 kg) (!) 380 lb (172.4 kg)  Height:  5' 5.4" (1.661 m)   Physical Exam General: NAD HENT: NCAT Lungs: CTAB, no wheeze, rhonchi or rales.  Cardiovascular: Normal heart sounds, no r/m/g, 2+ pulses in all extremities. No LE edema Abdomen: TTP in epigastric region, RUQ, and LUQ, diminished bowel sounds MSK: No asymmetry or muscle atrophy.  Skin: no lesions noted on exposed skin Neuro: Alert and oriented x4. CN grossly intact Psych: Normal mood and normal affect   Assessment & Plan:   HTN  (hypertension) Patient has history of hypertension.  Her blood pressure is uncontrolled when she is not doing her medications.  Blood pressure in the clinic is 147/101.  She does not check her blood pressure at home and asked to use natural remedies.  CMP was checked and she has normal renal function.  Plans are to restart her medications and see her in 1 week follow-up with hypertension.  Patient has acute abdominal pain today and be exacerbating her hypertension.  She also has OSA and does not wear her CPAP which can be causing her hypertension as well.  Discussed the importance of medications and CPAP at lengths with her.  Patient asked she did not like the CPAP and would like to try a mouthguard.  I stated there is no evidence of medical and OSA and she should be using her CPAP given she has severe OSA.  Obstructive sleep apnea Patient with history of OSA and noncompliant to her CPAP.  I encouraged her to use her CPAP regularly as it is contributing to her weight and hypertension.  Patient states the mask is uncomfortable for her and she would like to try a mouthguard.  I advised against using over-the-counter mouthguard for her severe OSA and discussed with her trying nasal pillows which I have ordered.  Will follow-up on this at the 1 week follow-up.  Abdominal pain Patient presents with concern of abdominal pain that is acute in nature.  She states it started 3 days ago.  She is having nausea and vomiting and has vomited 3 times since the onset of the abdominal pain.  She has not had any fevers or chills and states she is able to retain majority of oral intake.  Patient's pain is located in the upper abdomen but is not localized to one area.  She has tenderness to palpation in the upper abdomen and up to lower abdomen.  She states the pain does radiate down to her back.  She rates the pain 10 out of 10 but overall appears comfortable.  She denies any reflux symptoms but does have history of duodenal  ulcer.  She recently recovered from URI that lasted around 2 weeks stated she was coughing during this time.  Show diagnosis with the patient includes acute pancreatitis versus constipation induced vs hepatitis versus gastritis versus small bowel obstruction.  Given normal vitals and appearance of the patient outpatient workup was pursued.  Labs including CBC, CMP, lipase were obtained and were unremarkable except mild leukocytosis.  KUB was performed that showed nonobstructing gas pattern making SBO unlikely.  Normal lipase and hepatic function makes pancreatitis and hepatitis unlikely.  Patient's last normal bowel movement was on the 20th.  Patient was given Senokot-s twice daily for her constipation and PPI for gastritis.  I spoke with the patient today and she needs her abdominal pain has nearly resolved.  She reports that it with bowel movement after taking the Senokot which decreased her pain to 2 out of 10.  She states she feels significantly better.  Return precautions were given to the patient regarding her abdominal pain.  I will follow-up on abdominal pain at the follow-up visit next week.  At that time we can discuss decreasing her PPI to once a day versus stopping it.  Given her obesity she likely does have some underlying reflux.  Morbid obesity (HCC) Patient has class III obesity with BMI of 62.  Advised patient that treating her OSA will help with her weight loss.  Also advised patient that we can discuss further weight loss therapy at her follow-up visit.  Lifestyle modifications were reinforced but patient needs medical therapy given her BMI.  Healthcare maintenance Mammogram ordered this visit.  Although not on her care gaps will discuss colonoscopy at the 1 week visit.  Patient reports getting colonoscopy but has not provided any documentation.  Will recommend repeat colonoscopy at next visit.  URI (upper respiratory infection) Patient's symptoms have resolved.   See Encounters Tab  for problem based charting.  Patient Discussed with Dr. Karrie Meres, MD Eligha Bridegroom. [facility] Internal Medicine Residency, [name]

## 2022-11-05 DIAGNOSIS — R109 Unspecified abdominal pain: Secondary | ICD-10-CM | POA: Insufficient documentation

## 2022-11-05 NOTE — Assessment & Plan Note (Signed)
Patient presents with concern of abdominal pain that is acute in nature.  She states it started 3 days ago.  She is having nausea and vomiting and has vomited 3 times since the onset of the abdominal pain.  She has not had any fevers or chills and states she is able to retain majority of oral intake.  Patient's pain is located in the upper abdomen but is not localized to one area.  She has tenderness to palpation in the upper abdomen and up to lower abdomen.  She states the pain does radiate down to her back.  She rates the pain 10 out of 10 but overall appears comfortable.  She denies any reflux symptoms but does have history of duodenal ulcer.  She recently recovered from URI that lasted around 2 weeks stated she was coughing during this time.  Show diagnosis with the patient includes acute pancreatitis versus constipation induced vs hepatitis versus gastritis versus small bowel obstruction.  Given normal vitals and appearance of the patient outpatient workup was pursued.  Labs including CBC, CMP, lipase were obtained and were unremarkable except mild leukocytosis.  KUB was performed that showed nonobstructing gas pattern making SBO unlikely.  Normal lipase and hepatic function makes pancreatitis and hepatitis unlikely.  Patient's last normal bowel movement was on the 20th.  Patient was given Senokot-s twice daily for her constipation and PPI for gastritis.  I spoke with the patient today and she needs her abdominal pain has nearly resolved.  She reports that it with bowel movement after taking the Senokot which decreased her pain to 2 out of 10.  She states she feels significantly better.  Return precautions were given to the patient regarding her abdominal pain.  I will follow-up on abdominal pain at the follow-up visit next week.  At that time we can discuss decreasing her PPI to once a day versus stopping it.  Given her obesity she likely does have some underlying reflux.

## 2022-11-05 NOTE — Assessment & Plan Note (Signed)
Patient with history of OSA and noncompliant to her CPAP.  I encouraged her to use her CPAP regularly as it is contributing to her weight and hypertension.  Patient states the mask is uncomfortable for her and she would like to try a mouthguard.  I advised against using over-the-counter mouthguard for her severe OSA and discussed with her trying nasal pillows which I have ordered.  Will follow-up on this at the 1 week follow-up.

## 2022-11-05 NOTE — Assessment & Plan Note (Signed)
Patients symptoms have resolved.

## 2022-11-05 NOTE — Assessment & Plan Note (Signed)
Patient has class III obesity with BMI of 62.  Advised patient that treating her OSA will help with her weight loss.  Also advised patient that we can discuss further weight loss therapy at her follow-up visit.  Lifestyle modifications were reinforced but patient needs medical therapy given her BMI.

## 2022-11-05 NOTE — Assessment & Plan Note (Signed)
Mammogram ordered this visit.  Although not on her care gaps will discuss colonoscopy at the 1 week visit.  Patient reports getting colonoscopy but has not provided any documentation.  Will recommend repeat colonoscopy at next visit.

## 2022-11-05 NOTE — Assessment & Plan Note (Signed)
Patient has history of hypertension.  Her blood pressure is uncontrolled when she is not doing her medications.  Blood pressure in the clinic is 147/101.  She does not check her blood pressure at home and asked to use natural remedies.  CMP was checked and she has normal renal function.  Plans are to restart her medications and see her in 1 week follow-up with hypertension.  Patient has acute abdominal pain today and be exacerbating her hypertension.  She also has OSA and does not wear her CPAP which can be causing her hypertension as well.  Discussed the importance of medications and CPAP at lengths with her.  Patient asked she did not like the CPAP and would like to try a mouthguard.  I stated there is no evidence of medical and OSA and she should be using her CPAP given she has severe OSA.

## 2022-11-11 ENCOUNTER — Ambulatory Visit (INDEPENDENT_AMBULATORY_CARE_PROVIDER_SITE_OTHER): Payer: 59 | Admitting: Student

## 2022-11-11 DIAGNOSIS — G4733 Obstructive sleep apnea (adult) (pediatric): Secondary | ICD-10-CM

## 2022-11-11 DIAGNOSIS — K5900 Constipation, unspecified: Secondary | ICD-10-CM | POA: Diagnosis not present

## 2022-11-11 DIAGNOSIS — Z0000 Encounter for general adult medical examination without abnormal findings: Secondary | ICD-10-CM

## 2022-11-11 DIAGNOSIS — Z6841 Body Mass Index (BMI) 40.0 and over, adult: Secondary | ICD-10-CM

## 2022-11-11 DIAGNOSIS — I10 Essential (primary) hypertension: Secondary | ICD-10-CM | POA: Diagnosis not present

## 2022-11-11 MED ORDER — SENNOSIDES-DOCUSATE SODIUM 8.6-50 MG PO TABS: 2.0000 | ORAL_TABLET | ORAL | 0 refills | Status: AC

## 2022-11-11 NOTE — Patient Instructions (Signed)
This after visit summary is an important review of tests, referrals, and medication changes that were discussed during your visit. If you have questions or concerns, call [phone]. Outside of clinic business hours, call the main hospital at [phone] and ask the operator for the on-call internal medicine resident.   Ernesta Amble MD 11/11/2022, 4:22 PM

## 2022-11-11 NOTE — Progress Notes (Signed)
Internal Medicine Clinic Attending  Case discussed with the resident at the time of the visit.  We reviewed the resident's history and exam and pertinent patient test results.  I agree with the assessment, diagnosis, and plan of care documented in the resident's note.  

## 2022-11-11 NOTE — Assessment & Plan Note (Signed)
BP today 124/65.  Has not in fact been taking her losartan.  She self discontinued, attributing this medication to cramps.  She reports continued adherence to her labetalol.  I think it would be okay to discontinue losartan today based on the clinic blood pressure.  I recommend that she check blood pressure at home and maintain a log to bring to her next clinic visit. - Discontinue losartan - Continue labetalol 100 mg twice daily

## 2022-11-11 NOTE — Assessment & Plan Note (Signed)
Poor adherence to CPAP at home.  She reports that she does not like on the tubing lays on the bed at night.  She uses occasionally during the day for naps, sitting up, and tolerates this well.  I suggested that if she sleeps better with it in a reclined position that I would be a reasonable thing to try.

## 2022-11-11 NOTE — Assessment & Plan Note (Signed)
Weight stable around 385 pounds.  Discussed diet.  She is trying to make positive changes.  She uses a juicer at home.  Recommended adding more fresh vegetables and using a blender so as not to waste the valuable fiber in these foods.  Will refer to exercise program through the [facility], which she looks forward to starting.

## 2022-11-11 NOTE — Progress Notes (Signed)
    Subjective:  Claudia Brady is a 62 y.o. who presents to clinic for the following:  Concerns about weight and blood pressure.  Objective:   Vitals:   11/11/22 1507  BP: 124/65  Pulse: 70  Temp: 98.5 F (36.9 C)  TempSrc: Oral  SpO2: 94%  Weight: (!) 385 lb 11.2 oz (175 kg)  Height: 5\' 5"  (1.651 m)    Physical Exam Overall well-appearing Breathing is regular and unlabored on room air Skin is warm and dry Alert and oriented Pleasant, concordant affect  Assessment & Plan:   Healthcare maintenance Declines Pap smear today.  Had one done in 2019.  No intraepithelial lesion.  High risk HPV negative.  Has not been sexually active since then.  HTN (hypertension) BP today 124/65.  Has not in fact been taking her losartan.  She self discontinued, attributing this medication to cramps.  She reports continued adherence to her labetalol.  I think it would be okay to discontinue losartan today based on the clinic blood pressure.  I recommend that she check blood pressure at home and maintain a log to bring to her next clinic visit. - Discontinue losartan - Continue labetalol 100 mg twice daily  Morbid obesity (HCC) Weight stable around 385 pounds.  Discussed diet.  She is trying to make positive changes.  She uses a juicer at home.  Recommended adding more fresh vegetables and using a blender so as not to waste the valuable fiber in these foods.  Will refer to exercise program through the [facility], which she looks forward to starting.  Obstructive sleep apnea Poor adherence to CPAP at home.  She reports that she does not like on the tubing lays on the bed at night.  She uses occasionally during the day for naps, sitting up, and tolerates this well.  I suggested that if she sleeps better with it in a reclined position that I would be a reasonable thing to try.    Return in 2 months, for weight management, PREP program, CPAP, BP.  Patient discussed with Dr. Elige Ko MD 11/11/2022, 5:32 PM  [phone]

## 2022-11-11 NOTE — Assessment & Plan Note (Signed)
Declines Pap smear today.  Had one done in 2019.  No intraepithelial lesion.  High risk HPV negative.  Has not been sexually active since then.

## 2022-11-12 ENCOUNTER — Ambulatory Visit (INDEPENDENT_AMBULATORY_CARE_PROVIDER_SITE_OTHER): Payer: 59

## 2022-11-12 DIAGNOSIS — Z0000 Encounter for general adult medical examination without abnormal findings: Secondary | ICD-10-CM

## 2022-11-12 NOTE — Patient Instructions (Signed)

## 2022-11-12 NOTE — Progress Notes (Signed)
Subjective:   Claudia Brady is a 62 y.o. female who presents for Medicare Annual (Subsequent) preventive examination. I connected with  Tiffany Calmes on 11/12/22 by a audio enabled telemedicine application and verified that I am speaking with the correct person using two identifiers.  Patient Location: Home  Provider Location: Office/Clinic  I discussed the limitations of evaluation and management by telemedicine. The patient expressed understanding and agreed to proceed.    Review of Systems    DEFERRED TO PCP  Cardiac Risk Factors include: advanced age (>68men, >3 women);obesity (BMI >30kg/m2)     Objective:    Today's Vitals   11/12/22 1004  PainSc: 0-No pain   There is no height or weight on file to calculate BMI.     11/12/2022   10:15 AM 11/11/2022    3:08 PM 11/04/2022    1:56 PM 03/26/2022    4:20 PM 03/20/2021    3:26 PM 11/04/2020   10:37 AM 09/25/2020    2:57 PM  Advanced Directives  Does Patient Have a Medical Advance Directive? No No No No No No No  Would patient like information on creating a medical advance directive? No - Patient declined No - Patient declined No - Patient declined No - Patient declined No - Patient declined Yes (MAU/Ambulatory/Procedural Areas - Information given) No - Patient declined    Current Medications (verified) Outpatient Encounter Medications as of 11/12/2022  Medication Sig   Bioflavonoid Products (VITAMIN C) CHEW Chew by mouth.   Cholecalciferol (VITAMIN D) 50 MCG (2000 UT) CAPS Take by mouth.   clobetasol cream (TEMOVATE) 0.05 % Apply 1 Application topically 2 (two) times daily.   doravirin-lamivudin-tenofov df (DELSTRIGO) 100-300-300 MG TABS per tablet Take 1 tablet by mouth daily.   labetalol (NORMODYNE) 100 MG tablet TAKE 1 TABLET(100 MG) BY MOUTH TWICE DAILY   pantoprazole (PROTONIX) 40 MG tablet Take 1 tablet (40 mg total) by mouth daily.   senna-docusate (SENOKOT-S) 8.6-50 MG tablet Take 2 tablets by mouth See admin  instructions for 7 days. Take two tablets twice daily until you have a bowel movement and then take 2 tablets at night after that.   No facility-administered encounter medications on file as of 11/12/2022.    Allergies (verified) Ace inhibitors, Other, and Penicillins   History: Past Medical History:  Diagnosis Date   Abnormal uterine bleeding (AUB) 02/04/2018   ATRIAL FIBRILLATION 08/20/2010   Qualifier: History of  By: Excell Seltzer CMA, Lori     Chronic pain    Depression 11/28/2010   Duodenal ulcer Jan. 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 She endorsed symptoms of fevers, chills, coughing with sputum production, chest pain and dyspnea. She stated she tried tylenol, mucinex, and home remedies for her symptoms. Currently her only symptoms were dry cough and fatigue. She reported good recovery since the onset of her symptoms. Thorough hx of her symptoms and timeli   Migraine 09/17/2018   OSA (obstructive sleep apnea)    Patellofemoral arthralgia of both knees 03/25/2020   Plantar fascia syndrome    RESTLESS LEG SYNDROME 08/20/2010        Rhinitis    Shoulder injury 01/31/2020   Varicose  vein of leg    Venous insufficiency 09/16/2018   Previously referred for ECHO due to swelling in her legs, dyspnea on exertion, orthopnea, and fast weight gain. Echo in November 2019 showed EF of 55-60% and no diastolic dysfunction. Her symptoms of LE swelling may be due to venous insufficiency and obesity. She also has a history of atrial fibrillation per chart review but EKGs in system do not show this and it may have been paroxysmal leading t   Past Surgical History:  Procedure Laterality Date   FOOT SURGERY      TONSILLECTOMY     TUBAL LIGATION     Family History  Problem Relation Age of Onset   Sleep apnea Other    Diabetes Other    Hypertension Other    Asthma Other    Emphysema Other    Allergies Other    Thyroid disease Mother    Lupus Daughter    ADD / ADHD Son    Social History   Socioeconomic History   Marital status: Single    Spouse name: Not on file   Number of children: Not on file   Years of education: Not on file   Highest education level: Not on file  Occupational History   Not on file  Tobacco Use   Smoking status: Former    Years: 15    Types: Cigarettes    Quit date: 12/22/2015    Years since quitting: 6.8   Smokeless tobacco: Never  Vaping Use   Vaping Use: Former  Substance and Sexual Activity   Alcohol use: Yes    Comment: occ   Drug use: No   Sexual activity: Never  Other Topics Concern   Not on file  Social History Narrative   Current Social History 11/04/2020        Patient lives with her son in an apartment which is 1 story. There are steps up to the entrance the patient uses.       Patient's method of transportation is city bus, taxi cab.      The highest level of education was some college.      The patient currently does not work.      Identified important Relationships are "God"       Pets : a dog, Lobbyist / Fun: Gardening, plants       Current Stressors: "none"       Social Determinants of Health   Financial Resource Strain: Medium Risk (11/12/2022)   Overall Financial Resource Strain (CARDIA)    Difficulty of Paying Living Expenses: Somewhat hard  Food Insecurity: No Food Insecurity (11/12/2022)   Hunger Vital Sign    Worried About Running Out of Food in the Last Year: Never true    Ran Out of Food in the Last Year: Never true  Transportation Needs: No Transportation Needs (11/12/2022)   PRAPARE - Administrator, Civil Service (Medical): No    Lack of Transportation (Non-Medical): No  Physical Activity:  Insufficiently Active (11/12/2022)   Exercise Vital Sign    Days of Exercise per Week: 1 day    Minutes of Exercise per Session: 10 min  Stress: Stress Concern Present (11/12/2022)   [employer] of Occupational Health - Occupational Stress Questionnaire    Feeling of Stress : Rather much  Social Connections: Socially Isolated (11/12/2022)   Social Connection and Isolation Panel [NHANES]    Frequency of Communication with Friends  and Family: More than three times a week    Frequency of Social Gatherings with Friends and Family: Once a week    Attends Religious Services: Never    Database administrator or Organizations: No    Attends Engineer, structural: Never    Marital Status: Divorced    Tobacco Counseling Counseling given: Not Answered   Clinical Intake:  Pre-visit preparation completed: Yes  Pain : No/denies pain Pain Score: 0-No pain     BMI - recorded: 64 Nutritional Status: BMI > 30  Obese Nutritional Risks: None Diabetes: No  How often do you need to have someone help you when you read instructions, pamphlets, or other written materials from your doctor or pharmacy?: 1 - Never What is the last grade level you completed in school?: 12 GRADE  Diabetic?NO   Interpreter Needed?: No  Information entered by :: [facility] Jamiyla Ishee   Activities of Daily Living    11/12/2022   10:15 AM 11/11/2022    3:07 PM  In your present state of health, do you have any difficulty performing the following activities:  Hearing? 0 0  Vision? 0 0  Difficulty concentrating or making decisions? 0 0  Walking or climbing stairs? 1 1  Dressing or bathing? 0 0  Doing errands, shopping? 0 0  Preparing Food and eating ? N   Using the Toilet? N   In the past six months, have you accidently leaked urine? N   Do you have problems with loss of bowel control? N   Managing your Medications? N   Managing your Finances? N   Housekeeping or managing your Housekeeping? N     Patient  Care Team: Gwenevere Abbot, MD as PCP - General  Indicate any recent Medical Services you may have received from other than [facility] providers in the past year (date may be approximate).     Assessment:   This is a routine wellness examination for Claudia Brady.  Hearing/Vision screen No results found.  Dietary issues and exercise activities discussed: Current Exercise Habits: The patient does not participate in regular exercise at present   Goals Addressed   None   Depression Screen    11/12/2022   10:14 AM 11/11/2022    3:08 PM 11/04/2022    3:01 PM 11/04/2022    1:56 PM 03/26/2022    4:21 PM 03/21/2021   11:44 AM 11/04/2020   10:34 AM  PHQ 2/9 Scores  PHQ - 2 Score 0 0 0 0 0 1 0  PHQ- 9 Score     4 2 3     Fall Risk    11/12/2022   10:15 AM 11/11/2022    3:07 PM 11/04/2022    1:55 PM 03/26/2022    3:05 PM 03/20/2021    3:25 PM  Fall Risk   Falls in the past year? 0 0 0 1 0  Number falls in past yr: 0 0 0 0 0  Injury with Fall? 0 0 0 0 0  Risk for fall due to : No Fall Risks   Other (Comment);History of fall(s) No Fall Risks  Risk for fall due to: Comment    BMI   Follow up Falls prevention discussed;Falls evaluation completed Falls evaluation completed Falls evaluation completed Falls evaluation completed Falls evaluation completed;Falls prevention discussed    FALL RISK PREVENTION PERTAINING TO THE HOME:  Any stairs in or around the home? Yes  If so, are there any without handrails? Yes  Home free of  loose throw rugs in walkways, pet beds, electrical cords, etc? No  Adequate lighting in your home to reduce risk of falls? Yes   ASSISTIVE DEVICES UTILIZED TO PREVENT FALLS:  Life alert? No  Use of a cane, walker or w/c? Yes  Grab bars in the bathroom? No  Shower chair or bench in shower? No  Elevated toilet seat or a handicapped toilet? No   TIMED UP AND GO:  Was the test performed? No .  Length of time to ambulate 10 feet: N/A sec.     Cognitive Function:        11/12/2022    10:16 AM  6CIT Screen  What Year? 0 points  What month? 0 points  What time? 0 points  Count back from 20 0 points  Months in reverse 0 points  Repeat phrase 0 points  Total Score 0 points    Immunizations Immunization History  Administered Date(s) Administered   Hepatitis B, ADULT 10/23/2016, 12/21/2016, 01/17/2018   Influenza Inj Mdck Quad Pf 04/11/2019   Influenza,inj,Quad PF,6+ Mos 04/19/2017, 04/13/2018, 04/23/2020, 03/26/2022   Influenza-Unspecified 02/14/2018, 04/11/2019   PFIZER(Purple Top)SARS-COV-2 Vaccination 09/30/2019, 10/21/2019, 05/30/2020   Pneumococcal Conjugate-13 01/17/2018   Pneumococcal Polysaccharide-23 10/23/2016   Tdap 08/01/2018    TDAP status: Up to date  Flu Vaccine status: Up to date  Pneumococcal vaccine status: Declined,  Education has been provided regarding the importance of this vaccine but patient still declined. Advised may receive this vaccine at local pharmacy or Health Dept. Aware to provide a copy of the vaccination record if obtained from local pharmacy or Health Dept. Verbalized acceptance and understanding.   Covid-19 vaccine status: Completed vaccines  Qualifies for Shingles Vaccine? Yes   Zostavax completed No   Shingrix Completed?: No.    Education has been provided regarding the importance of this vaccine. Patient has been advised to call insurance company to determine out of pocket expense if they have not yet received this vaccine. Advised may also receive vaccine at local pharmacy or Health Dept. Verbalized acceptance and understanding.  Screening Tests Health Maintenance  Topic Date Due   Zoster Vaccines- Shingrix (1 of 2) Never done   PAP SMEAR-Modifier  02/05/2019   MAMMOGRAM  05/21/2019   COVID-19 Vaccine (4 - 2023-24 season) 03/13/2022   INFLUENZA VACCINE  02/11/2023   COLONOSCOPY (Pts 45-74yrs Insurance coverage will need to be confirmed)  07/14/2023   Medicare Annual Wellness (AWV)  11/12/2023   DTaP/Tdap/Td (2 -  Td or Tdap) 08/01/2028   Hepatitis C Screening  Completed   HIV Screening  Completed   HPV VACCINES  Aged Out    Health Maintenance  Health Maintenance Due  Topic Date Due   Zoster Vaccines- Shingrix (1 of 2) Never done   PAP SMEAR-Modifier  02/05/2019   MAMMOGRAM  05/21/2019   COVID-19 Vaccine (4 - 2023-24 season) 03/13/2022    Colorectal cancer screening: DEFERRED TO PCP   Mammogram status: Completed 05/20/2017.  SCHEDULED  FOR 11/20/2022    Lung Cancer Screening: (Low Dose CT Chest recommended if Age 70-80 years, 30 pack-year currently smoking OR have quit w/in 15years.) does not qualify.   Lung Cancer Screening Referral: DEFERRED TO PCP   Additional Screening:  Hepatitis C Screening: does qualify; Completed 10/02/2016  Vision Screening: Recommended annual ophthalmology exams for early detection of glaucoma and other disorders of the eye. Is the patient up to date with their annual eye exam?  No  Who is the provider or what is  the name of the office in which the patient attends annual eye exams? PT DOES NOT HAVE AN REGULAR EYE DR  If pt is not established with a provider, would they like to be referred to a provider to establish care? Yes .   Dental Screening: Recommended annual dental exams for proper oral hygiene  Community Resource Referral / Chronic Care Management: CRR required this visit?  No   CCM required this visit?  No      Plan:     I have personally reviewed and noted the following in the patient's chart:   Medical and social history Use of alcohol, tobacco or illicit drugs  Current medications and supplements including opioid prescriptions. Patient is not currently taking opioid prescriptions. Functional ability and status Nutritional status Physical activity Advanced directives List of other physicians Hospitalizations, surgeries, and ER visits in previous 12 months Vitals Screenings to include cognitive, depression, and falls Referrals and  appointments  In addition, I have reviewed and discussed with patient certain preventive protocols, quality metrics, and best practice recommendations. A written personalized care plan for preventive services as well as general preventive health recommendations were provided to patient.     Derrell Lolling, CMA   11/12/2022   Nurse Notes: NON FACE TO FACE    Claudia Brady , Thank you for taking time to come for your Medicare Wellness Visit. I appreciate your ongoing commitment to your health goals. Please review the following plan we discussed and let me know if I can assist you in the future.   These are the goals we discussed:  Goals       Increase physical activity      Begin seated and standing exercises with exercise band 2-3 times a week for 5-10 minutes to increase strength and balance.       Taking medications at prescribed times (pt-stated)      Patient will set an alarm on her phone which will go off when her medications are due        This is a list of the screening recommended for you and due dates:  Health Maintenance  Topic Date Due   Zoster (Shingles) Vaccine (1 of 2) Never done   Pap Smear  02/05/2019   Mammogram  05/21/2019   COVID-19 Vaccine (4 - 2023-24 season) 03/13/2022   Flu Shot  02/11/2023   Colon Cancer Screening  07/14/2023   Medicare Annual Wellness Visit  11/12/2023   DTaP/Tdap/Td vaccine (2 - Td or Tdap) 08/01/2028   Hepatitis C Screening: USPSTF Recommendation to screen - Ages 18-79 yo.  Completed   HIV Screening  Completed   HPV Vaccine  Aged Out

## 2022-11-13 NOTE — Progress Notes (Signed)
Internal Medicine Clinic Attending  Case discussed with the resident at the time of the visit.  We reviewed the resident's history and exam and pertinent patient test results.  I agree with the assessment, diagnosis, and plan of care documented in the resident's note.  

## 2022-11-19 NOTE — Progress Notes (Signed)
Internal Medicine Clinic Attending  Case discussed with the resident.  I reviewed the AWV findings.  I agree with the assessment, diagnosis, and plan of care documented in the AWV note.     

## 2022-11-20 ENCOUNTER — Ambulatory Visit: Payer: 59

## 2022-12-16 ENCOUNTER — Ambulatory Visit: Payer: 59 | Admitting: Dermatology

## 2022-12-23 NOTE — Progress Notes (Signed)
The 10-year ASCVD risk score (Arnett DK, et al., 2019) is: 5.2%   Values used to calculate the score:     Age: 62 years     Sex: Female     Is Non-Hispanic African American: Yes     Diabetic: No     Tobacco smoker: No     Systolic Blood Pressure: 124 mmHg     Is BP treated: Yes     HDL Cholesterol: 49 mg/dL     Total Cholesterol: 143 mg/dL  Sandie Ano, RN

## 2023-01-27 ENCOUNTER — Ambulatory Visit: Payer: 59 | Admitting: Podiatry

## 2023-01-27 DIAGNOSIS — M79674 Pain in right toe(s): Secondary | ICD-10-CM

## 2023-01-27 DIAGNOSIS — M79675 Pain in left toe(s): Secondary | ICD-10-CM | POA: Diagnosis not present

## 2023-01-27 DIAGNOSIS — B351 Tinea unguium: Secondary | ICD-10-CM

## 2023-01-27 NOTE — Progress Notes (Signed)
  Subjective:  Patient ID: Claudia Brady, female    DOB: 01/28/1961,  MRN: [id number]  Chief Complaint  Patient presents with   Nail Problem    Nail trim   62 y.o. female returns for the above complaint.  Patient presents with thickened elongated dystrophic mycotic toenails x 10 mild pain on palpation worse with ambulation of the patient like to be debrided and she is not able to do it herself.  Denies any other acute complaints  Objective:  There were no vitals filed for this visit. Podiatric Exam: Vascular: dorsalis pedis and posterior tibial pulses are palpable bilateral. Capillary return is immediate. Temperature gradient is WNL. Skin turgor WNL  Sensorium: Normal Semmes Weinstein monofilament test. Normal tactile sensation bilaterally. Nail Exam: Pt has thick disfigured discolored nails with subungual debris noted bilateral entire nail hallux through fifth toenails.  Pain on palpation to the nails. Ulcer Exam: There is no evidence of ulcer or pre-ulcerative changes or infection. Orthopedic Exam: Muscle tone and strength are WNL. No limitations in general ROM. No crepitus or effusions noted.  Skin: No Porokeratosis. No infection or ulcers    Assessment & Plan:   1. Pain due to onychomycosis of toenails of both feet     Patient was evaluated and treated and all questions answered.  Onychomycosis with pain  -Nails palliatively debrided as below. -Educated on self-care  Procedure: Nail Debridement Rationale: pain  Type of Debridement: manual, sharp debridement. Instrumentation: Nail nipper, rotary burr. Number of Nails: 10  Procedures and Treatment: Consent by patient was obtained for treatment procedures. The patient understood the discussion of treatment and procedures well. All questions were answered thoroughly reviewed. Debridement of mycotic and hypertrophic toenails, 1 through 5 bilateral and clearing of subungual debris. No ulceration, no infection noted.  Return  Visit-Office Procedure: Patient instructed to return to the office for a follow up visit 3 months for continued evaluation and treatment.  Nicholes Rough, DPM    No follow-ups on file.

## 2023-01-28 ENCOUNTER — Ambulatory Visit: Payer: 59 | Admitting: Internal Medicine

## 2023-02-02 ENCOUNTER — Ambulatory Visit: Payer: 59 | Admitting: Internal Medicine

## 2023-02-18 ENCOUNTER — Telehealth (INDEPENDENT_AMBULATORY_CARE_PROVIDER_SITE_OTHER): Payer: 59 | Admitting: Internal Medicine

## 2023-02-18 ENCOUNTER — Encounter: Payer: Self-pay | Admitting: Internal Medicine

## 2023-02-18 ENCOUNTER — Other Ambulatory Visit: Payer: Self-pay

## 2023-02-18 DIAGNOSIS — B20 Human immunodeficiency virus [HIV] disease: Secondary | ICD-10-CM

## 2023-02-18 DIAGNOSIS — Z79899 Other long term (current) drug therapy: Secondary | ICD-10-CM

## 2023-02-18 MED ORDER — ROSUVASTATIN CALCIUM 10 MG PO TABS: 10.0000 mg | ORAL_TABLET | Freq: Every day | ORAL | 11 refills | Status: DC

## 2023-02-18 NOTE — Progress Notes (Signed)
Virtual Visit via Video Note  I connected with Claudia Brady on 02/18/23 at  3:15 PM EDT by a video enabled telemedicine application and verified that I am speaking with the correct person using two identifiers.  Location: Patient: at home Provider: at hospital   I discussed the limitations of evaluation and management by telemedicine and the availability of in person appointments. The patient expressed understanding and agreed to proceed.  History of Present Illness:    Observations/Objective:   Assessment and Plan: Hiv disease = at next visit will do labs. Continue adherence  Health maintenance = interested in breast cancer screening but wondering if can do breast mri since sensitive for mammogram/"torture" Will also start on low dose rosuvastatin Flu shot and covid vaccine at next visit in 2 months Follow Up Instructions: see back in 2 months    I discussed the assessment and treatment plan with the patient. The patient was provided an opportunity to ask questions and all were answered. The patient agreed with the plan and demonstrated an understanding of the instructions.   The patient was advised to call back or seek an in-person evaluation if the symptoms worsen or if the condition fails to improve as anticipated.  I provided 20 minutes of non-face-to-face time during this encounter.   Judyann Munson, MD

## 2023-02-25 ENCOUNTER — Other Ambulatory Visit: Payer: Self-pay

## 2023-02-25 ENCOUNTER — Other Ambulatory Visit: Payer: 59

## 2023-02-25 DIAGNOSIS — B20 Human immunodeficiency virus [HIV] disease: Secondary | ICD-10-CM

## 2023-02-25 DIAGNOSIS — Z113 Encounter for screening for infections with a predominantly sexual mode of transmission: Secondary | ICD-10-CM

## 2023-03-18 ENCOUNTER — Ambulatory Visit: Payer: 59 | Admitting: Dermatology

## 2023-05-24 ENCOUNTER — Other Ambulatory Visit (HOSPITAL_COMMUNITY)
Admission: RE | Admit: 2023-05-24 | Discharge: 2023-05-24 | Disposition: A | Discharge status: Home / Self Care | Payer: 59 | Source: Ambulatory Visit | Attending: Internal Medicine | Admitting: Internal Medicine

## 2023-05-24 ENCOUNTER — Other Ambulatory Visit: Payer: 59

## 2023-05-24 ENCOUNTER — Other Ambulatory Visit: Payer: Self-pay

## 2023-05-24 DIAGNOSIS — Z79899 Other long term (current) drug therapy: Secondary | ICD-10-CM | POA: Diagnosis not present

## 2023-05-24 DIAGNOSIS — Z113 Encounter for screening for infections with a predominantly sexual mode of transmission: Secondary | ICD-10-CM | POA: Diagnosis present

## 2023-05-24 DIAGNOSIS — Z21 Asymptomatic human immunodeficiency virus [HIV] infection status: Secondary | ICD-10-CM

## 2023-05-25 LAB — T-HELPER CELL (CD4) - (RCID CLINIC ONLY)
CD4 % Helper T Cell: 49 % (ref 33–65)
CD4 T Cell Abs: 873 /uL (ref 400–1790)

## 2023-05-25 LAB — URINE CYTOLOGY ANCILLARY ONLY
Chlamydia: NEGATIVE
Comment: NEGATIVE
Comment: NORMAL
Neisseria Gonorrhea: NEGATIVE

## 2023-05-26 LAB — COMPLETE METABOLIC PANEL WITH GFR
AG Ratio: 1.1 (calc) (ref 1.0–2.5)
ALT: 12 U/L (ref 6–29)
AST: 17 U/L (ref 10–35)
Albumin: 4.1 g/dL (ref 3.6–5.1)
Alkaline phosphatase (APISO): 97 U/L (ref 37–153)
BUN: 9 mg/dL (ref 7–25)
CO2: 31 mmol/L (ref 20–32)
Calcium: 9.2 mg/dL (ref 8.6–10.4)
Chloride: 99 mmol/L (ref 98–110)
Creat: 0.67 mg/dL (ref 0.50–1.05)
Globulin: 3.6 g/dL (ref 1.9–3.7)
Glucose, Bld: 98 mg/dL (ref 65–99)
Potassium: 4.2 mmol/L (ref 3.5–5.3)
Sodium: 138 mmol/L (ref 135–146)
Total Bilirubin: 0.5 mg/dL (ref 0.2–1.2)
Total Protein: 7.7 g/dL (ref 6.1–8.1)
eGFR: 99 mL/min/{1.73_m2} (ref >=60)

## 2023-05-26 LAB — HIV-1 RNA QUANT-NO REFLEX-BLD
HIV 1 RNA Quant: NOT DETECTED {copies}/mL
HIV-1 RNA Quant, Log: NOT DETECTED {Log_copies}/mL

## 2023-05-26 LAB — CBC WITH DIFFERENTIAL/PLATELET
Absolute Lymphocytes: 1926 {cells}/uL (ref 850–3900)
Absolute Monocytes: 348 {cells}/uL (ref 200–950)
Basophils Absolute: 41 {cells}/uL (ref 0–200)
Basophils Relative: 0.7 %
Eosinophils Absolute: 151 {cells}/uL (ref 15–500)
Eosinophils Relative: 2.6 %
HCT: 38.3 % (ref 35.0–45.0)
Hemoglobin: 12.5 g/dL (ref 11.7–15.5)
MCH: 29.8 pg (ref 27.0–33.0)
MCHC: 32.6 g/dL (ref 32.0–36.0)
MCV: 91.2 fL (ref 80.0–100.0)
MPV: 10.5 fL (ref 7.5–12.5)
Monocytes Relative: 6 %
Neutro Abs: 3335 {cells}/uL (ref 1500–7800)
Neutrophils Relative %: 57.5 %
Platelets: 258 10*3/uL (ref 140–400)
RBC: 4.2 10*6/uL (ref 3.80–5.10)
RDW: 15.7 % — ABNORMAL HIGH (ref 11.0–15.0)
Total Lymphocyte: 33.2 %
WBC: 5.8 10*3/uL (ref 3.8–10.8)

## 2023-05-26 LAB — RPR: RPR Ser Ql: NONREACTIVE

## 2023-06-07 ENCOUNTER — Ambulatory Visit (INDEPENDENT_AMBULATORY_CARE_PROVIDER_SITE_OTHER): Payer: 59 | Admitting: Student

## 2023-06-07 ENCOUNTER — Encounter: Payer: Self-pay | Admitting: Student

## 2023-06-07 ENCOUNTER — Other Ambulatory Visit: Payer: Self-pay

## 2023-06-07 VITALS — BP 162/93 | HR 68 | Temp 97.7°F | Ht 65.0 in | Wt 385.0 lb

## 2023-06-07 DIAGNOSIS — Z0000 Encounter for general adult medical examination without abnormal findings: Secondary | ICD-10-CM

## 2023-06-07 DIAGNOSIS — Z23 Encounter for immunization: Secondary | ICD-10-CM

## 2023-06-07 DIAGNOSIS — Z21 Asymptomatic human immunodeficiency virus [HIV] infection status: Secondary | ICD-10-CM

## 2023-06-07 DIAGNOSIS — B20 Human immunodeficiency virus [HIV] disease: Secondary | ICD-10-CM | POA: Diagnosis not present

## 2023-06-07 DIAGNOSIS — I10 Essential (primary) hypertension: Secondary | ICD-10-CM

## 2023-06-07 DIAGNOSIS — I159 Secondary hypertension, unspecified: Secondary | ICD-10-CM

## 2023-06-07 MED ORDER — HYDROCHLOROTHIAZIDE 12.5 MG PO TABS: 12.5000 mg | ORAL_TABLET | Freq: Every day | ORAL | 3 refills | Status: DC

## 2023-06-07 MED ORDER — LOSARTAN POTASSIUM 25 MG PO TABS: 25.0000 mg | ORAL_TABLET | Freq: Every day | ORAL | 11 refills | Status: DC

## 2023-06-07 MED ORDER — OLMESARTAN MEDOXOMIL 40 MG PO TABS: 40.0000 mg | ORAL_TABLET | Freq: Every day | ORAL | 11 refills | Status: DC

## 2023-06-07 MED ORDER — DELSTRIGO 100-300-300 MG PO TABS: 1.0000 | ORAL_TABLET | Freq: Every day | ORAL | 11 refills | Status: DC

## 2023-06-07 MED ORDER — DELSTRIGO 100-300-300 MG PO TABS
1.0000 | ORAL_TABLET | Freq: Every day | ORAL | 11 refills | Status: DC
Start: 2023-06-07 — End: 2023-06-07

## 2023-06-07 NOTE — Progress Notes (Signed)
CC: HIV medication refill and follow-up  HPI:  [name] is a 62 y.o. female living with a history stated below and presents today for her medication refill and discuss her other chronic conditions. Please see problem based assessment and plan for additional details.  Past Medical History:  Diagnosis Date   Abnormal uterine bleeding (AUB) 02/04/2018   ATRIAL FIBRILLATION 08/20/2010   Qualifier: History of  By: Excell Seltzer CMA, Lori     Chronic pain    Depression 11/28/2010   Duodenal ulcer Jan. 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 She endorsed symptoms of fevers, chills, coughing with sputum production, chest pain and dyspnea. She stated she tried tylenol, mucinex, and home remedies for her symptoms. Currently her only symptoms were dry cough and fatigue. She reported good recovery since the onset of her symptoms. Thorough hx of her symptoms and timeli   Migraine 09/17/2018   OSA (obstructive sleep apnea)    Patellofemoral arthralgia of both knees 03/25/2020   Plantar fascia syndrome    RESTLESS LEG SYNDROME 08/20/2010        Rhinitis    Shoulder injury 01/31/2020   Varicose vein of leg    Venous insufficiency 09/16/2018   Previously referred for ECHO due to swelling in her legs, dyspnea on exertion, orthopnea, and fast weight gain. Echo in November 2019 showed EF of 55-60% and no diastolic dysfunction. Her symptoms of LE swelling may be due to venous insufficiency and obesity. She also has a history of atrial fibrillation per chart review but EKGs in system do not show this and it may have been paroxysmal leading t    Current Outpatient Medications on File Prior to Visit  Medication Sig  Dispense Refill   Bioflavonoid Products (VITAMIN C) CHEW Chew by mouth.     Cholecalciferol (VITAMIN D) 50 MCG (2000 UT) CAPS Take by mouth.     clobetasol cream (TEMOVATE) 0.05 % Apply 1 Application topically 2 (two) times daily. 30 g 3   doravirin-lamivudin-tenofov df (DELSTRIGO) 100-300-300 MG TABS per tablet Take 1 tablet by mouth daily. 30 tablet 11   labetalol (NORMODYNE) 100 MG tablet TAKE 1 TABLET(100 MG) BY MOUTH TWICE DAILY 180 tablet 1   pantoprazole (PROTONIX) 40 MG tablet Take 1 tablet (40 mg total) by mouth daily. 30 tablet 2   rosuvastatin (CRESTOR) 10 MG tablet Take 1 tablet (10 mg total) by mouth daily. 30 tablet 11   No current facility-administered medications on file prior to visit.    Family History  Problem Relation Age of Onset   Sleep apnea Other    Diabetes Other    Hypertension Other    Asthma Other    Emphysema Other    Allergies Other    Thyroid disease Mother    Lupus Daughter    ADD / ADHD Son     Social History   Socioeconomic History   Marital status: Single    Spouse name: Not on file   Number of children: Not on file   Years of education: Not on file   Highest education level: Not on file  Occupational History   Not on file  Tobacco Use  Smoking status: Former    Current packs/day: 0.00    Types: Cigarettes    Start date: 12/21/2000    Quit date: 12/22/2015    Years since quitting: 7.4   Smokeless tobacco: Never  Vaping Use   Vaping status: Former  Substance and Sexual Activity   Alcohol use: Yes    Comment: occ   Drug use: No   Sexual activity: Never    Comment: Not currently sexually active 8/24  Other Topics Concern   Not on file  Social History Narrative   Current Social History 11/04/2020        Patient lives with her son in an apartment which is 1 story. There are steps up to the entrance the patient uses.       Patient's method of transportation is city bus, taxi cab.      The highest level of education was some  college.      The patient currently does not work.      Identified important Relationships are "God"       Pets : a dog, Lobbyist / Fun: Gardening, plants       Current Stressors: "none"       Social Determinants of Health   Financial Resource Strain: Medium Risk (11/12/2022)   Overall Financial Resource Strain (CARDIA)    Difficulty of Paying Living Expenses: Somewhat hard  Food Insecurity: No Food Insecurity (11/12/2022)   Hunger Vital Sign    Worried About Running Out of Food in the Last Year: Never true    Ran Out of Food in the Last Year: Never true  Transportation Needs: No Transportation Needs (11/12/2022)   PRAPARE - Administrator, Civil Service (Medical): No    Lack of Transportation (Non-Medical): No  Physical Activity: Insufficiently Active (11/12/2022)   Exercise Vital Sign    Days of Exercise per Week: 1 day    Minutes of Exercise per Session: 10 min  Stress: Stress Concern Present (11/12/2022)   [employer] of Occupational Health - Occupational Stress Questionnaire    Feeling of Stress : Rather much  Social Connections: Socially Isolated (11/12/2022)   Social Connection and Isolation Panel [NHANES]    Frequency of Communication with Friends and Family: More than three times a week    Frequency of Social Gatherings with Friends and Family: Once a week    Attends Religious Services: Never    Database administrator or Organizations: No    Attends Banker Meetings: Never    Marital Status: Divorced  Catering manager Violence: Not At Risk (11/12/2022)   Humiliation, Afraid, Rape, and Kick questionnaire    Fear of Current or Ex-Partner: No    Emotionally Abused: No    Physically Abused: No    Sexually Abused: No    Review of Systems: ROS negative except for what is noted on the assessment and plan.  Vitals:   06/07/23 1312  BP: (!) 187/104  Pulse: 76  Temp: 97.7 F (36.5 C)  TempSrc: Oral  SpO2: 98%  Weight: (!) 385  lb (174.6 kg)  Height: 5\' 5"  (1.651 m)    Physical Exam: Constitutional: Obese appearing woman.  Sitting in chair, in no acute distress Cardiovascular: regular rate and rhythm, no m/r/g Pulmonary/Chest: normal work of breathing on room air, lungs clear to auscultation bilaterally Abdominal: soft, non-tender, non-distended MSK: normal bulk and tone Neurological: alert & oriented x 3, no focal deficit  Skin: warm and dry Psych: normal mood and behavior  Assessment & Plan:   No problem-specific Assessment & Plan notes found for this encounter.  HIV History of HIV currently being managed with Delstrigo.  Patient follows up with the infectious disease clinic her next visit is tomorrow 11/26.  Her last RNA quant showed an undetectable levels, CD4 cells at goal of 873, and RPR is nonreactive -Counseled patient to continue to adhere to her HIV regimen -Follow up with infectious disease clinic 06/08/2023 -Continue Delstrigo.   Hypertension Patient with a history of hypertension currently being managed with labetalol but patient stopped taking it about a year ago, says it made her sick whenever she took it so she stopped.  Her blood pressure in office today is 162/93.  Will restart the patient on ARB at  this time and follow-up in a month  Encounter for healthcare maintenance Patient has a colonoscopy on July 14, 2023.  PHQ score of 2.  Patient is agreeable of taking the flu shot today. -Give flu vaccine  Patient discussed with Dr. Ginger Carne, M.D [facility] Phone: [phone] Date 06/07/2023 Time 1:27 PM

## 2023-06-07 NOTE — Addendum Note (Signed)
Addended by: Burnell Blanks on: 06/07/2023 04:25 PM   Modules accepted: Level of Service

## 2023-06-07 NOTE — Patient Instructions (Addendum)
Thank you, Ms.Jemima Portales for allowing [place] to provide your care today. Today we discussed your general health,hypertension and other chronic conditions -Your blood pressure remains elevated after rechecking it twice -I am restarting you on a medication called losartan and hydrochlorothiazide -Take 12.5 mg of hydrochlorothiazide daily -Take 25 mg of losartan daily -Please come back in a month for blood work since we are starting you on these new medications  I have ordered the following labs for you:  Lab Orders  No laboratory test(s) ordered today     Tests ordered today:    Referrals ordered today:   Referral Orders  No referral(s) requested today     I have ordered the following medication/changed the following medications:   Stop the following medications: Medications Discontinued During This Encounter  Medication Reason   pantoprazole (PROTONIX) 40 MG tablet Expired Prescription   doravirin-lamivudin-tenofov df (DELSTRIGO) 100-300-300 MG TABS per tablet Reorder   doravirin-lamivudin-tenofov df (DELSTRIGO) 100-300-300 MG TABS per tablet    losartan (COZAAR) 25 MG tablet Change in therapy     Start the following medications: Meds ordered this encounter  Medications   DISCONTD: doravirin-lamivudin-tenofov df (DELSTRIGO) 100-300-300 MG TABS per tablet    Sig: Take 1 tablet by mouth daily.    Dispense:  30 tablet    Refill:  11   DISCONTD: losartan (COZAAR) 25 MG tablet    Sig: Take 1 tablet (25 mg total) by mouth daily.    Dispense:  30 tablet    Refill:  11   hydrochlorothiazide (HYDRODIURIL) 12.5 MG tablet    Sig: Take 1 tablet (12.5 mg total) by mouth daily.    Dispense:  90 tablet    Refill:  3   doravirin-lamivudin-tenofov df (DELSTRIGO) 100-300-300 MG TABS per tablet    Sig: Take 1 tablet by mouth daily.    Dispense:  30 tablet    Refill:  11   olmesartan (BENICAR) 40 MG tablet    Sig: Take 1 tablet (40 mg total) by mouth daily.    Dispense:  30 tablet     Refill:  11     Follow up:  1 month    Remember:   Should you have any questions or concerns please call the internal medicine clinic at [phone].    Kathleen Lime, M.D [facility]

## 2023-06-07 NOTE — Progress Notes (Signed)
Internal Medicine Clinic Attending  Case discussed with the resident at the time of the visit.  We reviewed the resident's history and exam and pertinent patient test results.  I agree with the assessment, diagnosis, and plan of care documented in the resident's note.  

## 2023-06-08 ENCOUNTER — Ambulatory Visit (INDEPENDENT_AMBULATORY_CARE_PROVIDER_SITE_OTHER): Payer: 59 | Admitting: Internal Medicine

## 2023-06-08 ENCOUNTER — Other Ambulatory Visit: Payer: Self-pay

## 2023-06-08 ENCOUNTER — Encounter: Payer: Self-pay | Admitting: Internal Medicine

## 2023-06-08 VITALS — BP 136/85 | HR 74 | Temp 97.8°F | Resp 16 | Wt 380.6 lb

## 2023-06-08 DIAGNOSIS — Z79899 Other long term (current) drug therapy: Secondary | ICD-10-CM

## 2023-06-08 DIAGNOSIS — B20 Human immunodeficiency virus [HIV] disease: Secondary | ICD-10-CM | POA: Diagnosis not present

## 2023-06-08 DIAGNOSIS — Z113 Encounter for screening for infections with a predominantly sexual mode of transmission: Secondary | ICD-10-CM

## 2023-06-08 DIAGNOSIS — Z21 Asymptomatic human immunodeficiency virus [HIV] infection status: Secondary | ICD-10-CM

## 2023-06-08 DIAGNOSIS — G2581 Restless legs syndrome: Secondary | ICD-10-CM

## 2023-06-08 DIAGNOSIS — R635 Abnormal weight gain: Secondary | ICD-10-CM | POA: Diagnosis not present

## 2023-06-08 DIAGNOSIS — Z23 Encounter for immunization: Secondary | ICD-10-CM

## 2023-06-08 DIAGNOSIS — I10 Essential (primary) hypertension: Secondary | ICD-10-CM | POA: Diagnosis not present

## 2023-06-08 DIAGNOSIS — Z87891 Personal history of nicotine dependence: Secondary | ICD-10-CM | POA: Diagnosis not present

## 2023-06-08 NOTE — Progress Notes (Signed)
Patient ID: Claudia Brady, female   DOB: Jan 22, 1961, 62 y.o.   MRN: [id number]  HPI  The patient, with a history of HIV, HTN, obesity and arthritis, presents with complaints of persistent knee pain and restlessness in the legs, particularly at night. The discomfort extends from the knees to the toes and is described as an aching sensation. The patient manages the discomfort by massaging the legs and feet, which provides some relief. The patient has a long-standing history of foot problems, which have caused difficulty in wearing shoes.  The patient's HIV is well-controlled on daily Delstrigo, with recent labs showing a stable viral load and CD4 count. The patient admits to occasional forgetfulness in taking the medication but has implemented strategies to improve adherence.  The patient also reports a decrease in physical activity due to knee pain and is considering lifestyle modifications to address weight concerns.   The patient's blood pressure has improved since the last appointment, and they are up-to-date with their flu vaccine. They plan to receive the COVID-19 vaccine during this visit. The patient is also considering dietary changes, including reducing sugar intake and transitioning to a diet of chicken, fish, and more vegetables.  Outpatient Encounter Medications as of 06/08/2023  Medication Sig   Bioflavonoid Products (VITAMIN C) CHEW Chew by mouth.   Cholecalciferol (VITAMIN D) 50 MCG (2000 UT) CAPS Take by mouth.   clobetasol cream (TEMOVATE) 0.05 % Apply 1 Application topically 2 (two) times daily.   doravirin-lamivudin-tenofov df (DELSTRIGO) 100-300-300 MG TABS per tablet Take 1 tablet by mouth daily.   hydrochlorothiazide (HYDRODIURIL) 12.5 MG tablet Take 1 tablet (12.5 mg total) by mouth daily.   rosuvastatin (CRESTOR) 10 MG tablet Take 1 tablet (10 mg total) by mouth daily.   labetalol (NORMODYNE) 100 MG tablet TAKE 1 TABLET(100 MG) BY MOUTH TWICE DAILY (Patient not  taking: Reported on 06/08/2023)   olmesartan (BENICAR) 40 MG tablet Take 1 tablet (40 mg total) by mouth daily. (Patient not taking: Reported on 06/08/2023)   No facility-administered encounter medications on file as of 06/08/2023.     Patient Active Problem List   Diagnosis Date Noted   Abdominal pain 11/05/2022   URI (upper respiratory infection) 10/26/2022   Bilateral foot pain 03/29/2022   Hemorrhoid 04/05/2019   Healthcare maintenance 04/05/2019   Morbid obesity (HCC) 04/19/2017   HTN (hypertension) 10/23/2016   Human immunodeficiency virus I infection (HCC) 10/02/2016   Poor dentition 10/02/2016   Obstructive sleep apnea 08/20/2010     Health Maintenance Due  Topic Date Due   Zoster Vaccines- Shingrix (1 of 2) Never done   Cervical Cancer Screening (Pap smear)  02/05/2019   MAMMOGRAM  05/21/2019   COVID-19 Vaccine (4 - 2023-24 season) 03/14/2023   Colonoscopy  07/14/2023     Review of Systems 12 point ros is otherwise negative Physical Exam   BP (!) 155/95   Pulse 74   Temp 97.8 F (36.6 C) (Temporal)   Resp 16   Wt (!) 380 lb 9.6 oz (172.6 kg)   SpO2 96%   BMI 63.34 kg/m   Physical Exam  Constitutional:  oriented to person, place, and time. appears well-developed and well-nourished. No distress.  HENT: /AT, PERRLA, no scleral icterus Mouth/Throat: Oropharynx is clear and moist. No oropharyngeal exudate.  Cardiovascular: Normal rate, regular rhythm and normal heart sounds. Exam reveals no gallop and no friction rub.  No murmur heard.  Pulmonary/Chest: Effort normal and breath sounds normal. No respiratory distress.  has no wheezes.  Neck = supple, no nuchal rigidity Lymphadenopathy: no cervical adenopathy. No axillary adenopathy Neurological: alert and oriented to person, place, and time.  Skin: Skin is warm and dry. No rash noted. No erythema.  Psychiatric: a normal mood and affect.  behavior is normal.   Lab Results  Component Value Date   CD4TCELL  49 05/24/2023   Lab Results  Component Value Date   CD4TABS 873 05/24/2023   CD4TABS 728 10/20/2022   CD4TABS 860 07/16/2021   Lab Results  Component Value Date   HIV1RNAQUANT Not Detected 05/24/2023   Lab Results  Component Value Date   HEPBSAB NEG 10/02/2016   Lab Results  Component Value Date   LABRPR NON-REACTIVE 05/24/2023    CBC Lab Results  Component Value Date   WBC 5.8 05/24/2023   RBC 4.20 05/24/2023   HGB 12.5 05/24/2023   HCT 38.3 05/24/2023   PLT 258 05/24/2023   MCV 91.2 05/24/2023   MCH 29.8 05/24/2023   MCHC 32.6 05/24/2023   RDW 15.7 (H) 05/24/2023   LYMPHSABS 1.5 11/04/2022   MONOABS 0.9 11/04/2022   EOSABS 151 05/24/2023    BMET Lab Results  Component Value Date   NA 138 05/24/2023   K 4.2 05/24/2023   CL 99 05/24/2023   CO2 31 05/24/2023   GLUCOSE 98 05/24/2023   BUN 9 05/24/2023   CREATININE 0.67 05/24/2023   CALCIUM 9.2 05/24/2023   GFRNONAA >60 11/04/2022   GFRAA 100 08/15/2020      Assessment and Plan      Assessment & Plan HIV Well-controlled HIV on Delstrigo with good adherence. Recent labs show excellent viral load and CD4 counts. Discussed separating vitamin intake from Delstrigo to avoid interactions. - Ensure refills for Delstrigo - Advise separating vitamin intake from Delstrigo - Continue regular follow-up and monitoring of viral load and CD4 counts  Knee Osteoarthritis Chronic knee pain managed with Tylenol, affecting daily activities and mobility. Discussed benefits of low-impact exercise and potential physical therapy referral. - Continue Tylenol as needed - Encourage regular low-impact exercise - Consider referral to physical therapy if pain persists  Hypertension - well controlled on 2 agents. Continue with current regimen  Restless Legs Syndrome Reports restless legs and achiness at night, with difficulty sleeping. Discussed trial of tonic water and importance of hydration. Consider further evaluation  if symptoms persist. - Recommend trial of tonic water - Encourage adequate hydration - Consider further evaluation if symptoms persist  Weight Management Concern about weight gain and difficulty losing weight. Plans to reduce sugar intake, increase vegetables, and cut out red meat in favor of chicken and fish. Discussed benefits of these dietary changes. - Encourage reduction of sugar and carbohydrate intake - Advise increasing vegetable consumption - Support plan to reduce red meat and increase chicken and fish intake - Monitor weight and consider further interventions if no improvement in a few months  General Health Maintenance Up to date with flu vaccination and plans to receive COVID-19 vaccine today. Needs to replenish multivitamins and discussed separating intake from Delstrigo. - Administer COVID-19 vaccine - Advise to purchase and take multivitamins, ensuring separation from Delstrigo intake  Follow-up - Ensure medication refills are sent to the pharmacy - Schedule follow-up appointment to monitor weight and overall health.

## 2023-06-09 ENCOUNTER — Telehealth: Payer: Self-pay

## 2023-06-09 NOTE — Addendum Note (Signed)
Addended by: Kathleen Lime on: 06/09/2023 11:34 AM   Modules accepted: Orders

## 2023-06-09 NOTE — Telephone Encounter (Signed)
Pt is requesting a call back .Marland Kitchen She stated that at her last OV with Dr Mickie Bail  she was given (hydrochlorothiazide (HYDRODIURIL) 12.5 MG tablet ) but she can't take this med due to it made her have a bad cough .Marland Kitchen So she is needing a new meds

## 2023-08-13 ENCOUNTER — Other Ambulatory Visit: Payer: Self-pay

## 2023-08-13 DIAGNOSIS — Z21 Asymptomatic human immunodeficiency virus [HIV] infection status: Secondary | ICD-10-CM

## 2023-08-13 MED ORDER — DELSTRIGO 100-300-300 MG PO TABS: 1.0000 | ORAL_TABLET | Freq: Every day | ORAL | 11 refills | Status: DC

## 2023-12-27 ENCOUNTER — Encounter: Payer: Self-pay | Admitting: *Deleted

## 2024-01-05 ENCOUNTER — Encounter

## 2024-01-11 ENCOUNTER — Encounter: Payer: Self-pay | Admitting: Student

## 2024-01-11 ENCOUNTER — Ambulatory Visit (INDEPENDENT_AMBULATORY_CARE_PROVIDER_SITE_OTHER): Payer: Self-pay | Admitting: Student

## 2024-01-11 ENCOUNTER — Other Ambulatory Visit: Payer: Self-pay

## 2024-01-11 DIAGNOSIS — Z6841 Body Mass Index (BMI) 40.0 and over, adult: Secondary | ICD-10-CM

## 2024-01-11 DIAGNOSIS — Z1231 Encounter for screening mammogram for malignant neoplasm of breast: Secondary | ICD-10-CM

## 2024-01-11 DIAGNOSIS — Z124 Encounter for screening for malignant neoplasm of cervix: Secondary | ICD-10-CM

## 2024-01-11 DIAGNOSIS — I10 Essential (primary) hypertension: Secondary | ICD-10-CM

## 2024-01-11 DIAGNOSIS — G4733 Obstructive sleep apnea (adult) (pediatric): Secondary | ICD-10-CM | POA: Diagnosis not present

## 2024-01-11 DIAGNOSIS — Z0000 Encounter for general adult medical examination without abnormal findings: Secondary | ICD-10-CM

## 2024-01-11 MED ORDER — TIRZEPATIDE-WEIGHT MANAGEMENT 2.5 MG/0.5ML ~~LOC~~ SOLN: 2.5000 mg | SUBCUTANEOUS | 0 refills | Status: DC

## 2024-01-11 MED ORDER — AMLODIPINE-OLMESARTAN 10-40 MG PO TABS: 1.0000 | ORAL_TABLET | Freq: Every day | ORAL | 3 refills | Status: DC

## 2024-01-11 MED ORDER — AMLODIPINE-OLMESARTAN 10-40 MG PO TABS: 1.0000 | ORAL_TABLET | Freq: Every day | ORAL | 3 refills | Status: AC

## 2024-01-11 NOTE — Progress Notes (Addendum)
 CC: Weight loss  HPI:  Claudia Brady is a 63 y.o. female living with a history stated below and presents today for weight loss. Please see problem based assessment and plan for additional details.  Past Medical History:  Diagnosis Date   Abnormal uterine bleeding (AUB) 02/04/2018   ATRIAL FIBRILLATION 08/20/2010   Qualifier: History of  By: Wonda CMA, Lori     Chronic pain    Depression 11/28/2010   Duodenal ulcer Jan. 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 She endorsed symptoms of fevers, chills, coughing with sputum production, chest pain and dyspnea. She stated she tried tylenol , mucinex, and home remedies for her symptoms. Currently her only symptoms were dry cough and fatigue. She reported good recovery since the onset of her symptoms. Thorough hx of her symptoms and timeli   Migraine 09/17/2018   OSA (obstructive sleep apnea)    Patellofemoral arthralgia of both knees 03/25/2020   Plantar fascia syndrome    RESTLESS LEG SYNDROME 08/20/2010        Rhinitis    Shoulder injury 01/31/2020   Varicose vein of leg    Venous insufficiency 09/16/2018   Previously referred for ECHO due to swelling in her legs, dyspnea on exertion, orthopnea, and fast weight gain. Echo in November 2019 showed EF of 55-60% and no diastolic dysfunction. Her symptoms of LE swelling may be due to venous insufficiency and obesity. She also has a history of atrial fibrillation per chart review but EKGs in system do not show this and it may have been paroxysmal leading t    Current Outpatient Medications on File Prior to Visit  Medication Sig Dispense Refill   Bioflavonoid Products (VITAMIN C) CHEW Chew by mouth.      Cholecalciferol (VITAMIN D) 50 MCG (2000 UT) CAPS Take by mouth.     clobetasol  cream (TEMOVATE ) 0.05 % Apply 1 Application topically 2 (two) times daily. 30 g 3   No current facility-administered medications on file prior to visit.    Family History  Problem Relation Age of Onset   Sleep apnea Other    Diabetes Other    Hypertension Other    Asthma Other    Emphysema Other    Allergies Other    Thyroid disease Mother    Lupus Daughter    ADD / ADHD Son     Social History   Socioeconomic History   Marital status: Single    Spouse name: Not on file   Number of children: Not on file   Years of education: Not on file   Highest education level: Not on file  Occupational History   Not on file  Tobacco Use   Smoking status: Former    Current packs/day: 0.00    Types: Cigarettes    Start date: 12/21/2000    Quit date: 12/22/2015    Years since quitting: 8.2   Smokeless tobacco: Never  Vaping Use   Vaping status: Former  Substance and Sexual Activity   Alcohol use: Yes    Comment: occ   Drug use: No   Sexual activity: Never    Comment: Not currently sexually active 8/24  Other Topics Concern  Not on file  Social History Narrative   Current Social History 11/04/2020        Patient lives with her son in an apartment which is 1 story. There are steps up to the entrance the patient uses.       Patient's method of transportation is city bus, taxi cab.      The highest level of education was some college.      The patient currently does not work.      Identified important Relationships are God       Pets : a dog, Lobbyist / Fun: Gardening, plants       Current Stressors: none       Social Drivers of Corporate investment banker Strain: Medium Risk (11/12/2022)   Overall Financial Resource Strain (CARDIA)    Difficulty of Paying Living Expenses: Somewhat hard  Food Insecurity: No Food Insecurity (11/12/2022)   Hunger Vital Sign    Worried About  Running Out of Food in the Last Year: Never true    Ran Out of Food in the Last Year: Never true  Transportation Needs: No Transportation Needs (11/12/2022)   PRAPARE - Administrator, Civil Service (Medical): No    Lack of Transportation (Non-Medical): No  Physical Activity: Insufficiently Active (11/12/2022)   Exercise Vital Sign    Days of Exercise per Week: 1 day    Minutes of Exercise per Session: 10 min  Stress: Stress Concern Present (11/12/2022)   [employer] of Occupational Health - Occupational Stress Questionnaire    Feeling of Stress : Rather much  Social Connections: Socially Isolated (11/12/2022)   Social Connection and Isolation Panel    Frequency of Communication with Friends and Family: More than three times a week    Frequency of Social Gatherings with Friends and Family: Once a week    Attends Religious Services: Never    Database administrator or Organizations: No    Attends Banker Meetings: Never    Marital Status: Divorced  Catering manager Violence: Not At Risk (11/12/2022)   Humiliation, Afraid, Rape, and Kick questionnaire    Fear of Current or Ex-Partner: No    Emotionally Abused: No    Physically Abused: No    Sexually Abused: No    Review of Systems: ROS negative except for what is noted on the assessment and plan.  Vitals:   01/11/24 1425 01/11/24 1432  BP: (!) 160/75 (!) 154/66  Pulse: 63 65  Temp: 97.6 F (36.4 C)   TempSrc: Oral   SpO2: 93%   Weight: (!) 403 lb 3.2 oz (182.9 kg)   Height: 5' 5 (1.651 m)     Physical Exam: Constitutional: Obese appearing woman, sitting in chair in no acute distress HENT: Large neck circumference Cardiovascular: regular rate and rhythm, no m/r/g Pulmonary/Chest: normal work of breathing on room air Neurological: alert & oriented x 3, no focal deficit Skin: warm and dry Psych: normal mood and behavior  Assessment & Plan:   Obstructive sleep apnea This patient has a history of  obstructive sleep apnea and has been approved for home CPAP therapy. However, she reports noncompliance with its use, citing discomfort as a contributing factor. We discussed the risks and benefits of CPAP therapy, including its role in managing obstructive sleep apnea and its potential to improve blood pressure control--an area of concern for her today.The patient expressed understanding and reassured me that  she will make an effort to resume regular CPAP use. -Counseled patient on the benefit of CPAP. - Prescribe Zepbound   Morbid obesity (HCC) Claudia Brady has a BMI of 67.1 kg/m. Over the past few years, she has made consistent efforts to lose weight through exercise and dietary modifications, including increasing her fiber intake and reducing carbohydrates and saturated fats. She also joined an exercise program at the [facility]. Despite these efforts, she has either maintained her current weight or experienced slight weight gain.She expressed significant frustration with her lack of progress and noted concerns about her declining cardiovascular health. Given her persistent, yet unsuccessful, attempts at weight loss, we discussed pharmacologic treatment options. After reviewing several possibilities, the patient elected to initiate Zepbound  therapy at this time.Claudia Brady also has a diagnosis of obstructive sleep apnea. We emphasized the importance of continuing lifestyle modifications, with Zepbound  serving as an adjunct to support her weight loss journey. - Start preauthorization for Zepbound  - Prescribe Zebound 2.5 mg weekly   HTN (hypertension) BP Readings from Last 3 Encounters:  01/11/24 (!) 154/66  06/08/23 136/85  06/07/23 (!) 162/93  Patient has a known history of hypertension.  It is unclear if the patient is adherent to her current regiment.  Upon my interview, she seems to intermittently takes her medication and not as prescribed.  Patient showed me her pills bottles it looks they had dated  back to 2022.  I worry and I wonder if she is taking expired medications as she did not confirm if she transferred her new prescription to her old bottles.  The setting of her uncontrolled blood pressure today and also during previous visit, I will discontinue all her existing antihypertensive and start Azor  10-40 mg today.  I think taking 1 pill a day versus 2 will help with her noncompliance issues. - Initiate Azor  10-40 mg tablet daily - Encourage ambulatory blood pressure checks  Healthcare maintenance Chart review reveals several care gaps that need to be addressed. The patient declined a Pap smear during today's visit but is agreeable to proceeding with a mammogram for breast cancer screening. -Ambulatory referral to GI imaging for mammogram -Consider revisiting care gaps during her next visit   Patient discussed with Dr. Lovie Drue Grow, M.D [facility] Phone: [phone] Date 03/09/2024 Time 8:58 AM

## 2024-01-11 NOTE — Patient Instructions (Signed)
 Thank you, Claudia Brady for allowing us  to provide your care today. Today we discussed weight loss management including lifestyle modification and available for medical therapy.  We discussed initiating step pound if insurance would approve at this time.  We also discussed her care gaps which include a mammogram for which you agreed to have that done but you deferred Pap smear.    Please discard old blood pressure medications you have at home and start taking the new once a day as prescribed.  I have ordered the following labs for you:  Lab Orders  No laboratory test(s) ordered today     Tests ordered today:    Referrals ordered today:   Referral Orders  No referral(s) requested today     I have ordered the following medication/changed the following medications:   Stop the following medications: There are no discontinued medications.   Start the following medications: Meds ordered this encounter  Medications   tirzepatide (ZEPBOUND) 2.5 MG/0.5ML injection vial    Sig: Inject 2.5 mg into the skin once a week.    Dispense:  2 mL    Refill:  0   amLODipine -olmesartan  (AZOR) 10-40 MG tablet    Sig: Take 1 tablet by mouth daily.    Dispense:  90 tablet    Refill:  3     Follow up: 2 months   Remember:   Should you have any questions or concerns please call the internal medicine clinic at [phone].   Drue Lisa Grow MD 01/11/2024, 3:36 PM   [facility]

## 2024-01-12 NOTE — Assessment & Plan Note (Addendum)
 This patient has a history of obstructive sleep apnea and has been approved for home CPAP therapy. However, she reports noncompliance with its use, citing discomfort as a contributing factor. We discussed the risks and benefits of CPAP therapy, including its role in managing obstructive sleep apnea and its potential to improve blood pressure control--an area of concern for her today.The patient expressed understanding and reassured me that she will make an effort to resume regular CPAP use. -Counseled patient on the benefit of CPAP. - Prescribe Zepbound 

## 2024-01-12 NOTE — Assessment & Plan Note (Signed)
 BP Readings from Last 3 Encounters:  01/11/24 (!) 154/66  06/08/23 136/85  06/07/23 (!) 162/93  Patient has a known history of hypertension.  It is unclear if the patient is adherent to her current regiment.  Upon my interview, she seems to intermittently takes her medication and not as prescribed.  Patient showed me her pills bottles it looks they had dated back to 2022.  I worry and I wonder if she is taking expired medications as she did not confirm if she transferred her new prescription to her old bottles.  The setting of her uncontrolled blood pressure today and also during previous visit, I will discontinue all her existing antihypertensive and start Azor 10-40 mg today.  I think taking 1 pill a day versus 2 will help with her noncompliance issues. - Initiate Azor 10-40 mg tablet daily - Encourage ambulatory blood pressure checks

## 2024-01-12 NOTE — Assessment & Plan Note (Addendum)
 Claudia Brady has a BMI of 67.1 kg/m. Over the past few years, she has made consistent efforts to lose weight through exercise and dietary modifications, including increasing her fiber intake and reducing carbohydrates and saturated fats. She also joined an exercise program at the [facility] Despite these efforts, she has either maintained her current weight or experienced slight weight gain.She expressed significant frustration with her lack of progress and noted concerns about her declining cardiovascular health. Given her persistent, yet unsuccessful, attempts at weight loss, we discussed pharmacologic treatment options. After reviewing several possibilities, the patient elected to initiate Zepbound  therapy at this time.Claudia Brady also has a diagnosis of obstructive sleep apnea. We emphasized the importance of continuing lifestyle modifications, with Zepbound  serving as an adjunct to support her weight loss journey. - Zepbound  as explain above under OSA

## 2024-01-12 NOTE — Assessment & Plan Note (Signed)
 Chart review reveals several care gaps that need to be addressed. The patient declined a Pap smear during today's visit but is agreeable to proceeding with a mammogram for breast cancer screening. -Ambulatory referral to GI imaging for mammogram -Consider revisiting care gaps during her next visit

## 2024-01-17 NOTE — Progress Notes (Signed)
 Internal Medicine Clinic Attending  Case discussed with the resident at the time of the visit.  We reviewed the resident's history and exam and pertinent patient test results.  I agree with the assessment, diagnosis, and plan of care documented in the resident's note.

## 2024-01-19 ENCOUNTER — Telehealth: Payer: Self-pay | Admitting: *Deleted

## 2024-01-19 NOTE — Telephone Encounter (Signed)
 Copied from CRM [id number]. Topic: Clinical - Prescription Issue >> Jan 19, 2024  1:27 PM Claudia Brady wrote: Reason for CRM: Patient's insurance does not cover tirzepatide  (ZEPBOUND ) 2.5 MG/0.5ML injection vial and she will need a prescription for a similar medication. Please call patient and let her know her options. She said she also was never given her results from her colonoscopy test. The pharmacy said it was too soon for her to fill the olmesartan . Please call her at [phone].

## 2024-01-21 ENCOUNTER — Telehealth: Payer: Self-pay | Admitting: *Deleted

## 2024-01-21 ENCOUNTER — Other Ambulatory Visit: Payer: Self-pay | Admitting: Student

## 2024-01-21 MED ORDER — WEGOVY 0.25 MG/0.5ML ~~LOC~~ SOAJ: 0.2500 mg | SUBCUTANEOUS | 0 refills | Status: DC

## 2024-01-21 NOTE — Telephone Encounter (Signed)
 Copied from CRM [id number]. Topic: Clinical - Medication Question >> Jan 21, 2024 10:31 AM Adrianna P wrote: Reason for CRM: Please call patient she has some questions regarding her medication. [phone]

## 2024-01-21 NOTE — Telephone Encounter (Signed)
 I called pt - she had question about Amlodipine . I informed her the doctor started her on combination pill Azor  10-40 mg instead of 2 separate meds; stated she understand. Also she stated the pharmacy told her Zepbound  is not covered by her insurance - change to Ozempic or Wegovy . Thanks

## 2024-01-24 ENCOUNTER — Telehealth: Payer: Self-pay | Admitting: *Deleted

## 2024-01-24 NOTE — Telephone Encounter (Signed)
 Copied from CRM [id number]. Topic: Clinical - Medication Question >> Jan 21, 2024 10:31 AM Adrianna P wrote: Reason for CRM: Please call patient she has some questions regarding her medication. [phone] >> Jan 24, 2024  3:17 PM Mercer PEDLAR wrote: Patient called stating that she is still waiting on callback because insurance will not cover zepbound  and patient would like a callback to discuss options and also had questions regarding amLODipine -olmesartan  (AZOR ) 10-40 MG tablet.

## 2024-01-24 NOTE — Telephone Encounter (Signed)
 RTC to patient since her insurance will not cover the Zepbound  patient would like to get something else ordered if possible. Copied from CRM [id number]. Topic: Clinical - Medication Question >> Jan 21, 2024 10:31 AM Claudia Brady wrote: Reason for CRM: Please call patient she has some questions regarding her medication. [phone] >> Jan 24, 2024  3:17 PM Claudia Brady wrote: Patient called stating that she is still waiting on callback because insurance will not cover zepbound  and patient would like a callback to discuss options and also had questions regarding amLODipine -olmesartan  (AZOR ) 10-40 MG tablet.

## 2024-01-25 ENCOUNTER — Telehealth: Payer: Self-pay

## 2024-01-25 NOTE — Telephone Encounter (Unsigned)
 Copied from CRM [id number]. Topic: Clinical - Medication Prior Auth >> Jan 25, 2024  2:12 PM Carrielelia G wrote: Medication: Semaglutide -Weight Management (WEGOVY ) 0.25 MG/0.5ML SOAJ  Although denied, patient spoke with the pharmacy and they told her that  It can be appealed and a reason needs to be included as to why she need to be on this medication.   Please advise

## 2024-01-25 NOTE — Telephone Encounter (Signed)
 Patient Name: Claudia Brady Patient DOB: 1961/04/29 Patient ID: [id number] Status of Request: Deny Medication Name: Wegovy  Inj 0.25mg  GPI/NDC: [id number] Decision Notes: Your requested medication is an anti-obesity medication that is excluded from Part D prescription coverage under Medicare rules, unless it is being used for a medically-accepted diagnosis approved by the Food and Drug Administration. Please refer to your Evidence of Coverage (EOC) section that references Part D drug coverage in your pharmacy plan documents for more information. Reviewed by: Jolaine *Please note: Wegovy  may be covered when used for a medically-accepted indication. The Food and Drug Administration has approved Wegovy  for the treatment to reduce the risk of major adverse cardiovascular events (cardiovascular death, non-fatal myocardial infarction, or non-fatal stroke). Any request outside this indication is an exclusion

## 2024-01-25 NOTE — Telephone Encounter (Signed)
 Prior Authorization for patient (Wegovy  0.25MG /0.5ML auto-injectors) came through on cover my meds was submitted with last office notes awaiting approval or denial.  KEY:[id number]

## 2024-01-26 NOTE — Telephone Encounter (Signed)
 Appeal information has been placed in the red teams box.

## 2024-01-27 ENCOUNTER — Ambulatory Visit
Admission: RE | Admit: 2024-01-27 | Discharge: 2024-01-27 | Disposition: A | Discharge status: Home / Self Care | Source: Ambulatory Visit | Attending: Internal Medicine

## 2024-02-03 ENCOUNTER — Telehealth: Payer: Self-pay | Admitting: *Deleted

## 2024-02-03 NOTE — Telephone Encounter (Signed)
 Will froward to Dr. Amoako and Jada, CMA Copied from CRM [id number]. Topic: Clinical - Medication Prior Auth >> Jan 25, 2024  2:12 PM Carrielelia G wrote: Medication: Semaglutide -Weight Management (WEGOVY ) 0.25 MG/0.5ML SOAJ  Although denied, patient spoke with the pharmacy and they told her that  It can be appealed and a reason needs to be included as to why she need to be on this medication.   Please advise >> Feb 03, 2024  4:18 PM Susanna ORN wrote: Patient called in requesting for Dr. Amoako or his nurse to give her a call. States that she has not heard anything else since last week on the Wegovy  and she really needs to speak with him. States she's trying to see what she needs to do about losing weight. Please give her a call back at [phone].

## 2024-02-04 NOTE — Telephone Encounter (Signed)
 Faxed PA to [facility], the transmission was successful.

## 2024-02-04 NOTE — Telephone Encounter (Signed)
 Appeal form has been refaxed again by [facility] this morning.

## 2024-02-07 ENCOUNTER — Telehealth: Payer: Self-pay

## 2024-02-07 DIAGNOSIS — E785 Hyperlipidemia, unspecified: Secondary | ICD-10-CM

## 2024-02-07 NOTE — Progress Notes (Unsigned)
 This patient is appearing on a report for being at risk of failing the adherence measure for cholesterol (statin) medications this calendar year.   Medication: rosuvastatin  10 mg daily Last fill date: 09/13/23 for 90 day supply  Lipid Panel     Component Value Date/Time   CHOL 143 10/20/2022 1525   TRIG 131 10/20/2022 1525   HDL 49 (L) 10/20/2022 1525   CHOLHDL 2.9 10/20/2022 1525   VLDL 16 10/02/2016 1037   LDLCALC 73 10/20/2022 1525    Reviewed medication indication, dosing, and goals of therapy.  and Will collaborate with provider to facilitate refill needs. Patient requests that new Rx for rosuvastatin  is sent to her preferred [facility]. She is willing to start taking her cholesterol medication once daily. She confirmed that she picked up her new blood pressure medication and has been taking it every day.   Lorain Baseman, PharmD [facility] [phone]

## 2024-02-08 MED ORDER — ROSUVASTATIN CALCIUM 10 MG PO TABS: 10.0000 mg | ORAL_TABLET | Freq: Every day | ORAL | 3 refills | Status: AC

## 2024-02-18 ENCOUNTER — Other Ambulatory Visit: Payer: Self-pay

## 2024-02-18 DIAGNOSIS — Z21 Asymptomatic human immunodeficiency virus [HIV] infection status: Secondary | ICD-10-CM

## 2024-02-18 DIAGNOSIS — Z79899 Other long term (current) drug therapy: Secondary | ICD-10-CM

## 2024-02-23 ENCOUNTER — Other Ambulatory Visit: Payer: Self-pay

## 2024-02-23 ENCOUNTER — Other Ambulatory Visit

## 2024-02-23 DIAGNOSIS — Z21 Asymptomatic human immunodeficiency virus [HIV] infection status: Secondary | ICD-10-CM

## 2024-02-23 DIAGNOSIS — Z79899 Other long term (current) drug therapy: Secondary | ICD-10-CM

## 2024-02-24 LAB — T-HELPER CELL (CD4) - (RCID CLINIC ONLY)
CD4 % Helper T Cell: 50 % (ref 33–65)
CD4 T Cell Abs: 667 /uL (ref 400–1790)

## 2024-02-25 LAB — CBC WITH DIFFERENTIAL/PLATELET
Absolute Lymphocytes: 1340 {cells}/uL (ref 850–3900)
Absolute Monocytes: 388 {cells}/uL (ref 200–950)
Basophils Absolute: 29 {cells}/uL (ref 0–200)
Basophils Relative: 0.5 %
Eosinophils Absolute: 108 {cells}/uL (ref 15–500)
Eosinophils Relative: 1.9 %
HCT: 38.5 % (ref 35.0–45.0)
Hemoglobin: 12.1 g/dL (ref 11.7–15.5)
MCH: 28.9 pg (ref 27.0–33.0)
MCHC: 31.4 g/dL — ABNORMAL LOW (ref 32.0–36.0)
MCV: 92.1 fL (ref 80.0–100.0)
MPV: 10.8 fL (ref 7.5–12.5)
Monocytes Relative: 6.8 %
Neutro Abs: 3836 {cells}/uL (ref 1500–7800)
Neutrophils Relative %: 67.3 %
Platelets: 298 Thousand/uL (ref 140–400)
RBC: 4.18 Million/uL (ref 3.80–5.10)
RDW: 13.6 % (ref 11.0–15.0)
Total Lymphocyte: 23.5 %
WBC: 5.7 Thousand/uL (ref 3.8–10.8)

## 2024-02-25 LAB — HIV-1 RNA QUANT-NO REFLEX-BLD
HIV 1 RNA Quant: NOT DETECTED {copies}/mL
HIV-1 RNA Quant, Log: NOT DETECTED {Log_copies}/mL

## 2024-02-25 LAB — LIPID PANEL
Cholesterol: 112 mg/dL (ref <200)
HDL: 50 mg/dL (ref >=50)
LDL Cholesterol (Calc): 47 mg/dL
Non-HDL Cholesterol (Calc): 62 mg/dL (ref <130)
Total CHOL/HDL Ratio: 2.2 (calc) (ref <5.0)
Triglycerides: 70 mg/dL (ref <150)

## 2024-02-25 LAB — COMPLETE METABOLIC PANEL WITHOUT GFR
AG Ratio: 1.2 (calc) (ref 1.0–2.5)
ALT: 11 U/L (ref 6–29)
AST: 18 U/L (ref 10–35)
Albumin: 4 g/dL (ref 3.6–5.1)
Alkaline phosphatase (APISO): 88 U/L (ref 37–153)
BUN: 7 mg/dL (ref 7–25)
CO2: 33 mmol/L — ABNORMAL HIGH (ref 20–32)
Calcium: 9.1 mg/dL (ref 8.6–10.4)
Chloride: 100 mmol/L (ref 98–110)
Creat: 0.72 mg/dL (ref 0.50–1.05)
Globulin: 3.4 g/dL (ref 1.9–3.7)
Glucose, Bld: 77 mg/dL (ref 65–99)
Potassium: 4.1 mmol/L (ref 3.5–5.3)
Sodium: 139 mmol/L (ref 135–146)
Total Bilirubin: 0.5 mg/dL (ref 0.2–1.2)
Total Protein: 7.4 g/dL (ref 6.1–8.1)

## 2024-03-01 ENCOUNTER — Ambulatory Visit

## 2024-03-01 ENCOUNTER — Other Ambulatory Visit: Payer: Self-pay

## 2024-03-02 ENCOUNTER — Ambulatory Visit: Payer: Self-pay

## 2024-03-02 ENCOUNTER — Emergency Department (HOSPITAL_COMMUNITY)

## 2024-03-02 ENCOUNTER — Other Ambulatory Visit: Payer: Self-pay

## 2024-03-02 ENCOUNTER — Emergency Department (HOSPITAL_COMMUNITY)
Admission: EM | Admit: 2024-03-02 | Discharge: 2024-03-03 | Disposition: A | Discharge status: Home / Self Care | Attending: Emergency Medicine | Admitting: Emergency Medicine

## 2024-03-02 ENCOUNTER — Ambulatory Visit (HOSPITAL_COMMUNITY)
Admission: EM | Admit: 2024-03-02 | Discharge: 2024-03-02 | Disposition: A | Discharge status: Home / Self Care | Attending: Family Medicine | Admitting: Family Medicine

## 2024-03-02 ENCOUNTER — Encounter (HOSPITAL_COMMUNITY): Payer: Self-pay | Admitting: Emergency Medicine

## 2024-03-02 DIAGNOSIS — S0990XA Unspecified injury of head, initial encounter: Secondary | ICD-10-CM

## 2024-03-02 DIAGNOSIS — H539 Unspecified visual disturbance: Secondary | ICD-10-CM | POA: Diagnosis not present

## 2024-03-02 DIAGNOSIS — I10 Essential (primary) hypertension: Secondary | ICD-10-CM | POA: Diagnosis not present

## 2024-03-02 DIAGNOSIS — Z79899 Other long term (current) drug therapy: Secondary | ICD-10-CM | POA: Insufficient documentation

## 2024-03-02 DIAGNOSIS — B20 Human immunodeficiency virus [HIV] disease: Secondary | ICD-10-CM | POA: Diagnosis not present

## 2024-03-02 DIAGNOSIS — Z794 Long term (current) use of insulin: Secondary | ICD-10-CM | POA: Diagnosis not present

## 2024-03-02 DIAGNOSIS — H43392 Other vitreous opacities, left eye: Secondary | ICD-10-CM | POA: Insufficient documentation

## 2024-03-02 DIAGNOSIS — Z21 Asymptomatic human immunodeficiency virus [HIV] infection status: Secondary | ICD-10-CM | POA: Diagnosis not present

## 2024-03-02 DIAGNOSIS — H538 Other visual disturbances: Secondary | ICD-10-CM | POA: Diagnosis present

## 2024-03-02 NOTE — ED Triage Notes (Signed)
 Pt has had floater in left eye for 2 days in the shape of a line. Yesterday the floater changed to the shape of a c. When pt went to see doctor she was told to come here. Pt has fall two weeks ago and hit head. Floater started 2 weeks after the fall. No other symptoms per pt. Not on thinners. Vision is at baseline otherwise

## 2024-03-02 NOTE — ED Provider Notes (Signed)
 [facility]    CSN: [id number] Arrival date & time: 03/02/24  1615      History   Chief Complaint Chief Complaint  Patient presents with   Visual Field Change    HPI Claudia Brady is a 63 y.o. female.   Claudia Brady is a 63 y.o. female with history of HIV, morbid obesity, hypertension, and atrial fibrillation presenting for chief complaint of Visual Field Change that started 2 days ago.  She was sleepwalking 2 weeks ago, went and sat on her toilet, and fell off of the toilet striking her head on the tile floor.  She was able to wake up, got in the shower, and did not pass out after falling/hitting her head.  She did not become nauseous or vomit after injury.  2 days ago, she developed a horizontal line in the center of her left eye vision.  The line has now turned into a C shape in vision in the left eye.  The C shape is there when she moves her eye bilaterally and does not affect the right eye.  Denies headache, eye redness, drainage from the left eye, rash to the face, dizziness, ear pain, and recent trauma or injuries to the left eye.   She does not take blood thinning medications and denies history of stroke.  Denies unilateral extremity weakness, speech changes, dizziness, and recent fever/chills. She called her eye doctor to make an appointment today and they told her to go to urgent care on an urgent basis for evaluation.     Past Medical History:  Diagnosis Date   Abnormal uterine bleeding (AUB) 02/04/2018   ATRIAL FIBRILLATION 08/20/2010   Qualifier: History of  By: Wonda CMA, Lori     Chronic pain    Depression 11/28/2010   Duodenal ulcer Jan. 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 She endorsed symptoms of fevers, chills, coughing with sputum production, chest pain and dyspnea. She stated she tried tylenol , mucinex, and home remedies for her symptoms. Currently her only symptoms were dry cough and fatigue. She reported good recovery since the onset of her symptoms. Thorough hx of her symptoms and timeli   Migraine 09/17/2018   OSA (obstructive sleep apnea)    Patellofemoral arthralgia of both knees 03/25/2020   Plantar fascia syndrome    RESTLESS LEG SYNDROME 08/20/2010        Rhinitis    Shoulder injury 01/31/2020   Varicose vein of leg    Venous insufficiency 09/16/2018   Previously referred for ECHO due to swelling in her legs, dyspnea on exertion, orthopnea, and fast weight gain. Echo in November 2019 showed EF of 55-60% and no diastolic dysfunction. Her symptoms of LE swelling may be due to venous insufficiency and obesity. She also has a history of atrial fibrillation per chart review but EKGs in system do not show this and it may have been paroxysmal leading t    Patient Active Problem List   Diagnosis Date Noted   Abdominal pain 11/05/2022   URI (upper respiratory infection) 10/26/2022   Bilateral foot pain 03/29/2022   Hemorrhoid 04/05/2019   Healthcare maintenance 04/05/2019   Morbid obesity (HCC) 04/19/2017  HTN (hypertension) 10/23/2016   Human immunodeficiency virus I infection (HCC) 10/02/2016   Poor dentition 10/02/2016   Obstructive sleep apnea 08/20/2010    Past Surgical History:  Procedure Laterality Date   FOOT SURGERY     TONSILLECTOMY     TUBAL LIGATION      OB History     Gravida  3   Para  2   Term  2   Preterm      AB  1   Living  2      SAB      IAB  1   Ectopic      Multiple      Live Births               Home Medications    Prior to Admission medications   Medication Sig Start Date End Date Taking? Authorizing Provider  amLODipine -olmesartan  (AZOR ) 10-40 MG tablet Take 1  tablet by mouth daily. 01/11/24   Amoako, Prince, MD  Bioflavonoid Products (VITAMIN C) CHEW Chew by mouth.    [provider]  Cholecalciferol (VITAMIN D) 50 MCG (2000 UT) CAPS Take by mouth.    [provider]  clobetasol  cream (TEMOVATE ) 0.05 % Apply 1 Application topically 2 (two) times daily. 07/23/22   Tobie Franky SQUIBB, DPM  doravirin-lamivudin-tenofov df (DELSTRIGO ) 100-300-300 MG TABS per tablet Take 1 tablet by mouth daily. 08/13/23   Luiz Channel, MD  rosuvastatin  (CRESTOR ) 10 MG tablet Take 1 tablet (10 mg total) by mouth daily. 02/08/24   Amoako, Prince, MD  Semaglutide -Weight Management (WEGOVY ) 0.25 MG/0.5ML SOAJ Inject 0.25 mg into the skin once a week. 01/21/24   Renne Homans, MD    Family History Family History  Problem Relation Age of Onset   Sleep apnea Other    Diabetes Other    Hypertension Other    Asthma Other    Emphysema Other    Allergies Other    Thyroid disease Mother    Lupus Daughter    ADD / ADHD Son     Social History Social History   Tobacco Use   Smoking status: Former    Current packs/day: 0.00    Types: Cigarettes    Start date: 12/21/2000    Quit date: 12/22/2015    Years since quitting: 8.2   Smokeless tobacco: Never  Vaping Use   Vaping status: Former  Substance Use Topics   Alcohol use: Yes    Comment: occ   Drug use: No     Allergies   Ace inhibitors, Other, and Penicillins   Review of Systems Review of Systems Per HPI  Physical Exam Triage Vital Signs ED Triage Vitals  Encounter Vitals Group     BP 03/02/24 1703 (!) 147/83     Girls Systolic BP Percentile --      Girls Diastolic BP Percentile --      Boys Systolic BP Percentile --      Boys Diastolic BP Percentile --      Pulse Rate 03/02/24 1703 66     Resp 03/02/24 1703 19     Temp 03/02/24 1703 98 F (36.7 C)     Temp Source 03/02/24 1703 Oral     SpO2 03/02/24 1703 97 %     Weight --      Height --      Head Circumference --      Peak Flow  --      Pain Score 03/02/24 1702 0  Pain Loc --      Pain Education --      Exclude from Growth Chart --    No data found.  Updated Vital Signs BP (!) 147/83 (BP Location: Right Arm)   Pulse 66   Temp 98 F (36.7 C) (Oral)   Resp 19   SpO2 97%   Visual Acuity Right Eye Distance: 20/30 Left Eye Distance: 20/30 Bilateral Distance: 20/30  Right Eye Near:   Left Eye Near:    Bilateral Near:     Physical Exam Vitals and nursing note reviewed.  Constitutional:      Appearance: She is not ill-appearing or toxic-appearing.  HENT:     Head: Normocephalic and atraumatic.     Right Ear: Hearing and external ear normal.     Left Ear: Hearing and external ear normal.     Nose: Nose normal.     Mouth/Throat:     Lips: Pink.  Eyes:     General: Lids are normal. Vision grossly intact. Gaze aligned appropriately.     Extraocular Movements: Extraocular movements intact.     Conjunctiva/sclera: Conjunctivae normal.     Right eye: Right conjunctiva is not injected.     Left eye: Left conjunctiva is not injected.     Comments: EOMs intact without pain or dizziness.   Pulmonary:     Effort: Pulmonary effort is normal.  Musculoskeletal:     Cervical back: Neck supple.  Skin:    General: Skin is warm and dry.     Capillary Refill: Capillary refill takes less than 2 seconds.     Findings: No rash.  Neurological:     General: No focal deficit present.     Mental Status: She is alert and oriented to person, place, and time. Mental status is at baseline.     GCS: GCS eye subscore is 4. GCS verbal subscore is 5. GCS motor subscore is 6.     Cranial Nerves: Cranial nerves 2-12 are intact. No dysarthria or facial asymmetry.     Sensory: Sensation is intact.     Motor: Motor function is intact. No weakness, tremor, abnormal muscle tone or pronator drift.     Coordination: Coordination is intact. Romberg sign negative. Coordination normal. Finger-Nose-Finger Test normal.     Gait: Gait  is intact.     Comments: Strength and sensation intact to bilateral upper and lower extremities (5/5). Moves all 4 extremities with normal coordination voluntarily. Non-focal neuro exam.   Psychiatric:        Mood and Affect: Mood normal.        Speech: Speech normal.        Behavior: Behavior normal.        Thought Content: Thought content normal.        Judgment: Judgment normal.      UC Treatments / Results  Labs (all labs ordered are listed, but only abnormal results are displayed) Labs Reviewed - No data to display  EKG   Radiology No results found.  Procedures Procedures (including critical care time)  Medications Ordered in UC Medications - No data to display  Initial Impression / Assessment and Plan / UC Course  I have reviewed the triage vital signs and the nursing notes.  Pertinent labs & imaging results that were available during my care of the patient were reviewed by me and considered in my medical decision making (see chart for details).   1.  Vision disturbance, HIV disease, injury of  head New unilateral vision changes after head injury 2 weeks ago.  Concern for acute intracranial abnormality, this will require further workup and evaluation in the emergency department with likely advanced imaging that we unfortunately do not have here in the urgent care. Recommend transfer to the nearest ER for further workup and evaluation to rule out brain abnormality causing unilateral vision changes in the setting of HIV. Patient is agreeable with plan.  Discussed risks of deferring ER visit, she further agrees with plan.  Discharged from urgent care in stable condition.   Final Clinical Impressions(s) / UC Diagnoses   Final diagnoses:  Vision disturbance  HIV disease (HCC)  Injury of head, initial encounter   Discharge Instructions   None    ED Prescriptions   None    PDMP not reviewed this encounter.   Enedelia Dorna HERO, [place] 03/02/24 1935

## 2024-03-02 NOTE — Telephone Encounter (Signed)
 RTC to patient to offer her a 3:45 PM appointment for today.  No answer.  Message was left for patient to call the Clinics.  Was also encouraged to be see as soon as possible.

## 2024-03-02 NOTE — ED Triage Notes (Signed)
 Pt reports that she had a fall a couple weeks ago. Pt scraped her forehead when fell.  Pt reports had floater in her left eye for a long while. Called her PCP to see if she needed to get referral to eye doctor and told needed to be seen. Reports now seeing a line and c shape in left eye.  Pt reports took her medication for her medications all together (normally takes them a part form each other) at 2 pm and now I am kinda woozy. Reports ate some soup due to having a tooth taken out yesterday.  Pt reports been years since had her eyes checked. Wears reading glasses.

## 2024-03-02 NOTE — ED Notes (Addendum)
 Patient is being discharged from the Urgent Care and sent to the Emergency Department via wheelchair. Per Rosaline Silk, NP, patient is in need of higher level of care due to visual changes. Patient is aware and verbalizes understanding of plan of care.  Vitals:   03/02/24 1703  BP: (!) 147/83  Pulse: 66  Resp: 19  Temp: 98 F (36.7 C)  SpO2: 97%

## 2024-03-02 NOTE — Telephone Encounter (Signed)
 FYI Only or Action Required?: Action required by provider: referral request.  Patient was last seen in primary care on 01/11/2024 by Renne Homans, MD.  Called Nurse Triage reporting Triage.  Symptoms began several days ago.  Interventions attempted: Nothing.  Symptoms are: unchanged.  Triage Disposition: See Physician Within 4 Hours (or PCP Triage)  Patient/caregiver understands and will follow disposition?:  Answer Assessment - Initial Assessment Questions Pt reports 3 day hx of larger than her baseline left eye floater. Also reports fall from toilet to floor 3 weeks ago during which she hit her forehead. Denies LOC and is not on blood thinners. Patient denies higher acuity questions.  Patient was advised to be seen today. Unable to make it in time for the one open in office appointment. Refused UC or ED, stating that she will go to [facility] tomorrow morning. This RN provided rationales for being seen same day, patient continued to refuse. ED precautions reviewed, pt verbalized understanding.   1. DESCRIPTION: How has your vision changed? (e.g., complete vision loss, blurred vision, double vision, floaters, etc.)     Patient reports chronic floaters, has had a larger floater in her left eye x 3 days  2. LOCATION: One or both eyes? If one, ask: Which eye?     Left  3. SEVERITY: Can you see anything? If Yes, ask: What can you see? (e.g., fine print)     States does not effect clarity of her vision  4. ONSET: When did this begin? Did it start suddenly or has this been gradual?     3 days ago  5. PATTERN: Does this come and go, or has it been constant since it started?     Constant  6. OTHER SYMPTOMS: Do you have any other symptoms? (e.g., confusion, headache, arm or leg weakness, speech problems)     Denies  Protocols used: Vision Loss or Change-A-AH Copied from CRM #[id number]. Topic: Clinical - Red Word Triage >> Mar 02, 2024  2:40 PM Brittney F wrote: [facility] that  prompted transfer to Nurse Triage:   Clemens two weeks ago and hit her head; scraped skin off her head with no bleeding; two days ago patient began to see a small floater in left eye; floater is now spreading out like the shape of a spider

## 2024-03-03 DIAGNOSIS — H43392 Other vitreous opacities, left eye: Secondary | ICD-10-CM | POA: Diagnosis not present

## 2024-03-03 MED ORDER — TETRACAINE HCL 0.5 % OP SOLN
2.0000 [drp] | Freq: Once | OPHTHALMIC | Status: AC
  Administered 2024-03-03: 2 [drp] via OPHTHALMIC
  Filled 2024-03-03: qty 4

## 2024-03-03 MED ORDER — ACETAMINOPHEN 325 MG PO TABS
650.0000 mg | ORAL_TABLET | Freq: Once | ORAL | Status: AC
  Administered 2024-03-03: 650 mg via ORAL
  Filled 2024-03-03: qty 2

## 2024-03-03 NOTE — Discharge Instructions (Addendum)
 Today you were seen for a floater of the left eye.  Please follow-up with Dr. Waylan with ophthalmology for further evaluation and workup.  Thank you for letting us  treat you today. After reviewing your imaging, I feel you are safe to go home. Please follow up with your PCP in the next several days and provide them with your records from this visit. Return to the Emergency Room if pain becomes severe or symptoms worsen.

## 2024-03-03 NOTE — ED Notes (Signed)
 Pt does not want to sit in bed or be connected to monitor. Pt stated she is ready to go and just wants to see an eye dr.

## 2024-03-03 NOTE — ED Provider Notes (Signed)
 [facility] Provider Note   CSN: [id number] Arrival date & time: 03/02/24  8062     Patient presents with: Eye Problem (floater)   Claudia Brady is a 63 y.o. female past medical history significant for hypertension and HIV presents today for a floater in the left eye x 2 days.  Patient denies any pain, other vision changes, eye trauma, discharge, or any other complaints at this time.  Patient reports that she has not seen an ophthalmologist in quite some time.    Eye Problem      Prior to Admission medications   Medication Sig Start Date End Date Taking? Authorizing Provider  amLODipine -olmesartan  (AZOR ) 10-40 MG tablet Take 1 tablet by mouth daily. 01/11/24   Amoako, Prince, MD  Bioflavonoid Products (VITAMIN C) CHEW Chew by mouth.    [provider]  Cholecalciferol (VITAMIN D) 50 MCG (2000 UT) CAPS Take by mouth.    [provider]  clobetasol  cream (TEMOVATE ) 0.05 % Apply 1 Application topically 2 (two) times daily. 07/23/22   Tobie Franky SQUIBB, DPM  doravirin-lamivudin-tenofov df (DELSTRIGO ) 100-300-300 MG TABS per tablet Take 1 tablet by mouth daily. 08/13/23   Luiz Channel, MD  rosuvastatin  (CRESTOR ) 10 MG tablet Take 1 tablet (10 mg total) by mouth daily. 02/08/24   Amoako, Prince, MD  Semaglutide -Weight Management (WEGOVY ) 0.25 MG/0.5ML SOAJ Inject 0.25 mg into the skin once a week. 01/21/24   Renne Homans, MD    Allergies: Ace inhibitors, Other, and Penicillins    Review of Systems  Eyes:  Positive for visual disturbance.    Updated Vital Signs BP (!) 159/86 (BP Location: Left Arm)   Pulse 65   Temp 98.4 F (36.9 C) (Oral)   Resp 18   Ht 5' 6 (1.676 m)   Wt (!) 181.4 kg   SpO2 96%   BMI 64.56 kg/m   Physical Exam Vitals and nursing note reviewed.  Constitutional:      General: She is not in acute distress.    Appearance: She is well-developed.  HENT:     Head: Normocephalic and atraumatic.   Eyes:     General: Vision grossly intact. No visual field deficit.       Right eye: No discharge or hordeolum.        Left eye: No discharge or hordeolum.     Intraocular pressure: Left eye pressure is 16 mmHg. Measurements were taken using a handheld tonometer.    Extraocular Movements: Extraocular movements intact.     Right eye: Normal extraocular motion and no nystagmus.     Left eye: Normal extraocular motion and no nystagmus.     Conjunctiva/sclera: Conjunctivae normal.     Pupils: Pupils are equal, round, and reactive to light.  Cardiovascular:     Rate and Rhythm: Normal rate and regular rhythm.     Pulses: Normal pulses.  Pulmonary:     Effort: Pulmonary effort is normal. No respiratory distress.  Abdominal:     Palpations: Abdomen is soft.     Tenderness: There is no abdominal tenderness.  Musculoskeletal:        General: No swelling.     Cervical back: Neck supple.  Skin:    General: Skin is warm and dry.     Capillary Refill: Capillary refill takes less than 2 seconds.  Neurological:     General: No focal deficit present.     Mental Status: She is alert and oriented to person,  place, and time.     Cranial Nerves: No cranial nerve deficit.     Sensory: No sensory deficit.     Motor: No weakness.  Psychiatric:        Mood and Affect: Mood normal.     (all labs ordered are listed, but only abnormal results are displayed) Labs Reviewed - No data to display  EKG: None  Radiology: CT HEAD WO CONTRAST ( ) Result Date: 03/02/2024 CLINICAL DATA:  Orbital trauma. EXAM: CT HEAD WITHOUT CONTRAST TECHNIQUE: Contiguous axial images were obtained from the base of the skull through the vertex without intravenous contrast. RADIATION DOSE REDUCTION: This exam was performed according to the departmental dose-optimization program which includes automated exposure control, adjustment of the mA and/or kV according to patient size and/or use of iterative reconstruction technique.  COMPARISON:  Head CT 02/07/2022 FINDINGS: Brain: No intracranial hemorrhage, mass effect, or midline shift. No hydrocephalus. The basilar cisterns are patent. No evidence of territorial infarct or acute ischemia. No extra-axial or intracranial fluid collection. Vascular: No hyperdense vessel or unexpected calcification. Skull: No fracture or focal lesion. Sinuses/Orbits: No evidence of orbital fracture. The included globes are intact. Paranasal sinuses are clear. No mastoid effusion. Other: No confluent soft tissue hematoma. IMPRESSION: No acute intracranial abnormality. No skull fracture. Electronically Signed   By: Andrea Gasman M.D.   On: 03/02/2024 20:52     Ultrasound ED Ocular  Date/Time: 03/03/2024 5:01 AM  Performed by: Francis Ileana SAILOR, PA-C Authorized by: Francis Ileana SAILOR, PA-C   PROCEDURE DETAILS:    Indications: evaluation for increased intracranial pressure and visual change     Assessed:  Left eye   Images: archived     Limitations:  None LEFT EYE FINDINGS:     no evidence of retinal detachment of the left eye    Medications Ordered in the ED  acetaminophen  (TYLENOL ) tablet 650 mg (650 mg Oral Given 03/03/24 0135)  tetracaine  (PONTOCAINE) 0.5 % ophthalmic solution 2 drop (2 drops Left Eye Given 03/03/24 0108)                                    Medical Decision Making Risk OTC drugs. Prescription drug management.   This patient presents to the ED for concern of left eye floater differential diagnosis includes retinal detachment, elevated intraocular pressure, brain lesion   Imaging Studies ordered:  I ordered imaging studies including CT head Noncon I independently visualized and interpreted imaging which showed no acute intracranial abnormality I agree with the radiologist interpretation   Medicines ordered and prescription drug management:  I ordered medication including Tylenol     I have reviewed the patients home medicines and have made adjustments as  needed   Problem List / ED Course:  Considered for admission or further workup however patient's vital signs, physical exam, and imaging are reassuring.  Patient has no signs or symptoms concerning for elevated intraocular pressure or retinal detachment.  Patient advised to follow-up with ophthalmology for further evaluation workup.  I feel patient safe for discharge at this time.     Final diagnoses:  Vitreous floaters of left eye    ED Discharge Orders     None          Francis Ileana SAILOR DEVONNA 03/03/24 0503    Trine Raynell Moder, MD 03/03/24 [phone]

## 2024-03-08 ENCOUNTER — Ambulatory Visit: Payer: Self-pay | Admitting: Internal Medicine

## 2024-03-08 ENCOUNTER — Telehealth: Payer: Self-pay | Admitting: *Deleted

## 2024-03-08 ENCOUNTER — Encounter: Payer: Self-pay | Admitting: Internal Medicine

## 2024-03-08 ENCOUNTER — Other Ambulatory Visit: Payer: Self-pay

## 2024-03-08 DIAGNOSIS — I10 Essential (primary) hypertension: Secondary | ICD-10-CM | POA: Diagnosis not present

## 2024-03-08 DIAGNOSIS — Z21 Asymptomatic human immunodeficiency virus [HIV] infection status: Secondary | ICD-10-CM

## 2024-03-08 DIAGNOSIS — E785 Hyperlipidemia, unspecified: Secondary | ICD-10-CM

## 2024-03-08 MED ORDER — DELSTRIGO 100-300-300 MG PO TABS: 1.0000 | ORAL_TABLET | Freq: Every day | ORAL | 11 refills | Status: AC

## 2024-03-08 NOTE — Telephone Encounter (Signed)
 Patient here asking about getting the Wegovy  for weight loss.  PA was denied as patient has Medicare Part D and they do not cover medications for weight loss.  We will be able to send in a PA for the Zepbound  using the diagnosis of Sleep Apnea which patient has.  In your last note will you be able to add something about her Sleep Apnea and do a prescription for the Zepbound .

## 2024-03-08 NOTE — Progress Notes (Signed)
 Patient ID: Claudia Brady, female   DOB: [birth year], 63 y.o.   MRN: [id number]  HPI  Claudia Brady is a 63 year old female with HIV who presents for a follow-up on her lab work and medication management.  She is adherent to her HIV medications, taking them daily, and her viral load remains undetectable. Her CD4 count is 667, which is slightly lower than previous counts. She places her medication in front of her TV to remember to take it.  She has concerns about her cholesterol levels. Her total cholesterol is 112, down from 143 five years ago. Her HDL is 50, and her LDL is 47, both within desirable ranges. Triglycerides are 77. She has previously taken cholesterol medication but had stopped and then resumed it.  She is experiencing weight gain, which she describes as the most she has ever weighed. This has impacted her mobility and breathing, leading to a period of isolation where she avoided going outside. She has started to go out more, motivated by her dog and the desire to improve her health for her grandson's future milestones. She lives alone and has expressed a need for more frequent contact with her family.  She received the pneumonia vaccine at [facility] last year but has not yet taken the shingles vaccine. She is hesitant to take it at this time due to potential side effects.  Outpatient Encounter Medications as of 03/08/2024  Medication Sig   amLODipine -olmesartan  (AZOR ) 10-40 MG tablet Take 1 tablet by mouth daily.   Cholecalciferol (VITAMIN D) 50 MCG (2000 UT) CAPS Take by mouth.   clobetasol  cream (TEMOVATE ) 0.05 % Apply 1 Application topically 2 (two) times daily.   doravirin-lamivudin-tenofov df (DELSTRIGO ) 100-300-300 MG TABS per tablet Take 1 tablet by mouth daily.   rosuvastatin  (CRESTOR ) 10 MG tablet Take 1 tablet (10 mg total) by mouth daily.   Bioflavonoid Products (VITAMIN C) CHEW Chew by mouth.   Semaglutide -Weight Management (WEGOVY ) 0.25 MG/0.5ML SOAJ  Inject 0.25 mg into the skin once a week.   No facility-administered encounter medications on file as of 03/08/2024.     Patient Active Problem List   Diagnosis Date Noted   Abdominal pain 11/05/2022   URI (upper respiratory infection) 10/26/2022   Bilateral foot pain 03/29/2022   Hemorrhoid 04/05/2019   Healthcare maintenance 04/05/2019   Morbid obesity (HCC) 04/19/2017   HTN (hypertension) 10/23/2016   Human immunodeficiency virus I infection (HCC) 10/02/2016   Poor dentition 10/02/2016   Obstructive sleep apnea 08/20/2010     Health Maintenance Due  Topic Date Due   Zoster Vaccines- Shingrix (1 of 2) Never done   Cervical Cancer Screening (Pap smear)  02/05/2019   Pneumococcal Vaccine: 50+ Years (3 of 3 - PCV20 or PCV21) 10/23/2021   Colonoscopy  07/14/2023   Medicare Annual Wellness (AWV)  11/12/2023   INFLUENZA VACCINE  02/11/2024     Review of Systems 12 point ros is negative except what is mentioned above Physical Exam   BP (!) 147/81   Pulse 71   Temp 97.6 F (36.4 C) (Temporal)   Ht 5' 6 (1.676 m)   Wt (!) 395 lb (179.2 kg)   SpO2 93%   BMI 63.75 kg/m   Physical Exam  Constitutional:  oriented to person, place, and time. appears well-developed and well-nourished. No distress.  HENT: [place]/AT, PERRLA, no scleral icterus Mouth/Throat: Oropharynx is clear and moist. No oropharyngeal exudate.  Cardiovascular: Normal rate, regular rhythm and normal heart sounds. Exam reveals  no gallop and no friction rub.  No murmur heard.  Pulmonary/Chest: Effort normal and breath sounds normal. No respiratory distress.  has no wheezes.  Neck = supple, no nuchal rigidity Abdominal: Soft. Bowel sounds are normal.  exhibits no distension. There is no tenderness.  Lymphadenopathy: no cervical adenopathy. No axillary adenopathy Neurological: alert and oriented to person, place, and time.  Skin: Skin is warm and dry. No rash noted. No erythema.  Psychiatric: a normal mood and  affect.  behavior is normal.   Lab Results  Component Value Date   CD4TCELL 50 02/23/2024   Lab Results  Component Value Date   CD4TABS 667 02/23/2024   CD4TABS 873 05/24/2023   CD4TABS 728 10/20/2022   Lab Results  Component Value Date   HIV1RNAQUANT NOT DETECTED 02/23/2024   Lab Results  Component Value Date   HEPBSAB NEG 10/02/2016   Lab Results  Component Value Date   LABRPR NON-REACTIVE 05/24/2023    CBC Lab Results  Component Value Date   WBC 5.7 02/23/2024   RBC 4.18 02/23/2024   HGB 12.1 02/23/2024   HCT 38.5 02/23/2024   PLT 298 02/23/2024   MCV 92.1 02/23/2024   MCH 28.9 02/23/2024   MCHC 31.4 (L) 02/23/2024   RDW 13.6 02/23/2024   LYMPHSABS 1.5 11/04/2022   MONOABS 0.9 11/04/2022   EOSABS 108 02/23/2024    BMET Lab Results  Component Value Date   NA 139 02/23/2024   K 4.1 02/23/2024   CL 100 02/23/2024   CO2 33 (H) 02/23/2024   GLUCOSE 77 02/23/2024   BUN 7 02/23/2024   CREATININE 0.72 02/23/2024   CALCIUM  9.1 02/23/2024   GFRNONAA >60 11/04/2022   GFRAA 100 08/15/2020    LABS   CD4 count: 667   Viral load: undetectable   Cholesterol: 112 mg/dL   HDL: 50 mg/dL   LDL: 47 mg/dL   Triglycerides: 77 mg/dL    Assessment and Plan H ---------------- HIV disease HIV disease is well-controlled with a CD4 count of 667 and an undetectable viral load. She is adherent to her medication regimen, taking her HIV medications daily. Continue with destrigo  Obesity Obesity significantly impacts her mobility and quality of life. She is motivated to lose weight and has been attempting lifestyle changes. There are issues with obtaining Wegovy  injections due to insurance approval. - Contact primary care provider to discuss Wegovy  prescription and insurance approval.  Hypertension Hypertension is managed with medication, which she takes approximately 30 minutes after her HIV medication.  Hyperlipidemia Cholesterol levels are well-controlled with a  total cholesterol of 112, HDL of 50, LDL of 47, and triglycerides of 77. She has resumed taking her cholesterol medication after a brief lapse.  General Health Maintenance She reports receiving the pneumonia vaccine at [facility] last year, though it is not documented in the system. She has not received the shingles vaccine due to concerns about side effects.

## 2024-03-09 ENCOUNTER — Telehealth: Payer: Self-pay

## 2024-03-09 MED ORDER — TIRZEPATIDE-WEIGHT MANAGEMENT 2.5 MG/0.5ML ~~LOC~~ SOLN: 2.5000 mg | SUBCUTANEOUS | 1 refills | Status: AC

## 2024-03-09 NOTE — Telephone Encounter (Signed)
 Patient Name: Claudia Brady Patient DOB: 01-Aug-1960 Patient ID: [id number] Status of Request: Deny Medication Name: Zepbound  Inj 2.5/0.5 GPI/NDC: [id number] Decision Notes: ZEPBOUND  INJ 2.5/0.5 is denied because it is not on your plan's Drug List (formulary). Medication authorization requires the following: (1) Your provider submits medical records (for example: chart notes) confirming moderate to severe obstructive sleep apnea [for example: 15 or more obstructive respiratory events (apnea-hypopnea index) per hour of sleep confirmed by a sleep study]. Reviewed by: R.Ph. **Please note: A temporary transition supply (for up to one-month supply and within plan limits) may be filled at your local pharmacy if you are eligible for transition benefits. For more information on your Part D drug coverage, please refer to the Part D section of your Evidence of Coverage (EOC).

## 2024-03-09 NOTE — Telephone Encounter (Signed)
 Prior Authorization for patient (Zepbound  2.5MG /0.5ML pen-injectors) came through on cover my meds was submitted with last office notes awaiting approval or denial.  [id number]

## 2024-03-09 NOTE — Addendum Note (Signed)
 Addended by: RENNE HOMANS on: 03/09/2024 09:02 AM   Modules accepted: Orders

## 2024-03-22 ENCOUNTER — Telehealth: Payer: Self-pay | Admitting: *Deleted

## 2024-03-22 NOTE — Telephone Encounter (Signed)
  Copied from CRM #[id number]. Topic: Clinical - Prescription Issue >> Mar 22, 2024 10:49 AM Alfonso ORN wrote: Reason for CRM:  patient want to know the status of the getting the (Zepbound  2.5MG /0.5ML pen-injection or the Wegovy  for weight loss , patient stated she should qualify has sleep apnea , its been almost 2 weeks and have not heard anything Please contact patient to let know the status of getting either of the medication >> Mar 22, 2024 10:54 AM Alfonso ORN wrote: Patient stated been discussing this matter with her provider since 02/23/24

## 2024-03-23 NOTE — Telephone Encounter (Signed)
Thank you Camille! 

## 2024-03-23 NOTE — Telephone Encounter (Signed)
 Claudia Brady I submitted this prior authorization request and  it was denied (please see my note from 03/09/24). Are you able to help with this request?

## 2024-04-26 ENCOUNTER — Ambulatory Visit

## 2024-04-26 ENCOUNTER — Other Ambulatory Visit: Payer: Self-pay

## 2024-05-24 ENCOUNTER — Ambulatory Visit

## 2024-06-12 NOTE — Progress Notes (Signed)
 The ASCVD Risk score (Arnett DK, et al., 2019) failed to calculate for the following reasons:   The valid total cholesterol range is 130 to 320 mg/dL  Arlon Bergamo, BSN, RN

## 2024-07-18 DIAGNOSIS — Z1231 Encounter for screening mammogram for malignant neoplasm of breast: Secondary | ICD-10-CM

## 2024-07-26 ENCOUNTER — Telehealth: Payer: Self-pay | Admitting: *Deleted

## 2024-07-26 ENCOUNTER — Ambulatory Visit: Admitting: Student

## 2024-07-26 ENCOUNTER — Other Ambulatory Visit: Payer: Self-pay

## 2024-07-26 ENCOUNTER — Encounter: Payer: Self-pay | Admitting: Student

## 2024-07-26 VITALS — BP 131/70 | HR 85 | Ht 65.0 in | Wt 389.6 lb

## 2024-07-26 DIAGNOSIS — J069 Acute upper respiratory infection, unspecified: Secondary | ICD-10-CM | POA: Diagnosis not present

## 2024-07-26 NOTE — Progress Notes (Signed)
" ° °  Established Patient Office Visit  Subjective   Patient ID: Claudia Brady, female    DOB: 06-19-1961  Age: 64 y.o. MRN: [id number]  Chief Complaint  Patient presents with   Acute Visit    Sick visit .she/her/hers reports having a cold it started since 1/12..  pt  reports having a productive cough in the day time but at night it is dry ... She has been taking OTC meds ....  She stated that she has been in contact with a child who has strep throat     [name] is a 64 y.o. female with past medical history of hypertension, OSA, HIV, obesity, hyperlipidemia presents today for an acute visit for symptoms of viral URI.  Review of Systems:  As per assessment and Plan   Objective:     Vitals:   07/26/24 1352 07/26/24 1358  BP:  131/70  Pulse: 95 85  TempSrc: Oral   SpO2: 94%   Weight: (!) 389 lb 9.6 oz (176.7 kg)   Height: 5' 5 (1.651 m)     Physical Exam General: Sitting in chair, no acute distress Cardiovascular: Regular rate Pulmonary: Breathing comfortably Abdomen: Soft, nontender, nondistended MSK: No lower extremity edema bilaterally     Assessment & Plan:   Patient discussed with Dr. Shawn  Problem List Items Addressed This Visit       Respiratory   URI (upper respiratory infection)   Patient reports her symptoms started on Sunday, described as cold chills, sore throat with gritty sensation, nasal congestion, productive cough with clear phlegm, headaches, sinus pressure.  Denies any odynophagia, fevers, nausea, vomiting, diarrhea.  Able to tolerate soft and liquid diet.  She has been trying home remedies such as honey, alka seltzer plus for cold and flu.  On physical exam, no pharyngeal erythema noted, no anterior or posterior lymphadenopathy noted, lungs were clear without wheezing or crackles. Patient was notified that she has more likely viral upper respiratory tract infection. CENTOR score -1  - Continue supportive care such as Tylenol , lozenges,  hydration and rest.       Other Visit Diagnoses       Viral URI with cough    -  Primary       Return if symptoms worsen or fail to improve.    Toma Edwards, DO "

## 2024-07-26 NOTE — Telephone Encounter (Signed)
 Mammogram appointment  with the [facility] 01/11/2025 @ 4:00 pm ot arrive 3:30 pm. Appointment letter mailed to the patient .

## 2024-07-26 NOTE — Progress Notes (Signed)
 Internal Medicine Clinic Attending  Case discussed with the resident at the time of the visit.  We reviewed the resident's history and exam and pertinent patient test results.  I agree with the assessment, diagnosis, and plan of care documented in the resident's note.

## 2024-07-26 NOTE — Assessment & Plan Note (Addendum)
 Patient reports her symptoms started on Sunday, described as cold chills, sore throat with gritty sensation, nasal congestion, productive cough with clear phlegm, headaches, sinus pressure.  Denies any odynophagia, fevers, nausea, vomiting, diarrhea.  Able to tolerate soft and liquid diet.  She has been trying home remedies such as honey, alka seltzer plus for cold and flu.  On physical exam, no pharyngeal erythema noted, no anterior or posterior lymphadenopathy noted, lungs were clear without wheezing or crackles. Patient was notified that she has more likely viral upper respiratory tract infection. CENTOR score -1  - Continue supportive care such as Tylenol , lozenges, hydration and rest.

## 2024-07-26 NOTE — Patient Instructions (Signed)
 Thank you, [name] for allowing us  to provide your care today. Today we discussed:  You have a common cold  Sore throat and headache -- Sore throat and headache are best treated with a mild pain reliever such as acetaminophen  (sample brand name: Tylenol ) or a nonsteroidal antiinflammatory drug (NSAID) such as ibuprofen  (sample brand names: Advil , Motrin ) or naproxen  (sample brand names: Aleve , Naprosyn ). You can also try Chlorhexidine spray over the counter.   Cough -- Common cough medicine ingredients include guaifenesin and dextromethorphan; these are often combined with other medications in over-the-counter cold formulas. These cough medications provide only minor benefit for cough in most patients, and excessive use can cause side effects.    I have ordered the following medication/changed the following medications:   Stop the following medications: There are no discontinued medications.   Start the following medications: No orders of the defined types were placed in this encounter.    Follow up:   If symptoms do not improve or worsens   Remember:   Should you have any questions or concerns please call the internal medicine clinic at [phone].     Rayann Atway, D.O. [facility]

## 2024-08-21 ENCOUNTER — Ambulatory Visit

## 2024-09-27 ENCOUNTER — Ambulatory Visit: Payer: Self-pay

## 2025-01-11 ENCOUNTER — Ambulatory Visit
